# Patient Record
Sex: Female | Born: 1955 | Race: White | Hispanic: No | Marital: Single | Smoking: Former smoker
Health system: Southern US, Community
[De-identification: ages and names within clinical notes are randomized; demographics above are authoritative.]

## PROBLEM LIST (undated history)

## (undated) DIAGNOSIS — I10 Essential (primary) hypertension: Secondary | ICD-10-CM

## (undated) HISTORY — PX: CHOLECYSTECTOMY: SHX55

## (undated) HISTORY — PX: ABDOMINAL HYSTERECTOMY: SHX81

## (undated) HISTORY — PX: TONSILLECTOMY: SUR1361

## (undated) HISTORY — PX: APPENDECTOMY: SHX54

---

## 1998-01-05 ENCOUNTER — Other Ambulatory Visit: Admission: RE | Admit: 1998-01-05 | Discharge: 1998-01-05 | Payer: Self-pay | Admitting: Obstetrics and Gynecology

## 1998-09-28 ENCOUNTER — Ambulatory Visit (HOSPITAL_COMMUNITY): Admission: RE | Admit: 1998-09-28 | Discharge: 1998-09-28 | Payer: Self-pay | Admitting: Family Medicine

## 1998-09-28 ENCOUNTER — Encounter: Payer: Self-pay | Admitting: Family Medicine

## 2001-03-28 ENCOUNTER — Other Ambulatory Visit: Admission: RE | Admit: 2001-03-28 | Discharge: 2001-03-28 | Payer: Self-pay | Admitting: Obstetrics and Gynecology

## 2001-05-24 ENCOUNTER — Encounter (INDEPENDENT_AMBULATORY_CARE_PROVIDER_SITE_OTHER): Payer: Self-pay

## 2001-05-24 ENCOUNTER — Ambulatory Visit (HOSPITAL_COMMUNITY): Admission: RE | Admit: 2001-05-24 | Discharge: 2001-05-24 | Payer: Self-pay | Admitting: Obstetrics and Gynecology

## 2002-04-04 ENCOUNTER — Other Ambulatory Visit: Admission: RE | Admit: 2002-04-04 | Discharge: 2002-04-04 | Payer: Self-pay | Admitting: Obstetrics and Gynecology

## 2003-04-23 ENCOUNTER — Other Ambulatory Visit: Admission: RE | Admit: 2003-04-23 | Discharge: 2003-04-23 | Payer: Self-pay | Admitting: Obstetrics and Gynecology

## 2003-06-12 ENCOUNTER — Ambulatory Visit (HOSPITAL_COMMUNITY): Admission: RE | Admit: 2003-06-12 | Discharge: 2003-06-12 | Payer: Self-pay | Admitting: Family Medicine

## 2004-04-27 ENCOUNTER — Encounter (INDEPENDENT_AMBULATORY_CARE_PROVIDER_SITE_OTHER): Payer: Self-pay | Admitting: *Deleted

## 2004-04-27 ENCOUNTER — Observation Stay (HOSPITAL_COMMUNITY): Admission: RE | Admit: 2004-04-27 | Discharge: 2004-04-28 | Payer: Self-pay | Admitting: Obstetrics and Gynecology

## 2004-04-30 ENCOUNTER — Ambulatory Visit: Payer: Self-pay | Admitting: Pulmonary Disease

## 2004-04-30 ENCOUNTER — Ambulatory Visit: Payer: Self-pay | Admitting: Infectious Diseases

## 2004-04-30 ENCOUNTER — Inpatient Hospital Stay (HOSPITAL_COMMUNITY): Admission: AD | Admit: 2004-04-30 | Discharge: 2004-05-06 | Payer: Self-pay | Admitting: Obstetrics and Gynecology

## 2004-05-01 ENCOUNTER — Encounter: Payer: Self-pay | Admitting: Obstetrics and Gynecology

## 2005-06-08 ENCOUNTER — Other Ambulatory Visit: Admission: RE | Admit: 2005-06-08 | Discharge: 2005-06-08 | Payer: Self-pay | Admitting: Obstetrics and Gynecology

## 2009-03-17 ENCOUNTER — Encounter: Admission: RE | Admit: 2009-03-17 | Discharge: 2009-03-17 | Payer: Self-pay | Admitting: Family Medicine

## 2010-04-02 ENCOUNTER — Encounter: Admission: RE | Admit: 2010-04-02 | Discharge: 2010-04-02 | Payer: Self-pay | Admitting: Family Medicine

## 2010-04-15 ENCOUNTER — Encounter: Admission: RE | Admit: 2010-04-15 | Discharge: 2010-04-15 | Payer: Self-pay | Admitting: Family Medicine

## 2010-06-06 ENCOUNTER — Encounter: Payer: Self-pay | Admitting: Family Medicine

## 2010-10-01 NOTE — Discharge Summary (Signed)
Julie Mcguire, DUKEMAN               ACCOUNT NO.:  [id number]   MEDICAL RECORD NO.:  [id number]          PATIENT TYPE:  INP   LOCATION:  9372                          FACILITY:  [facility]   PHYSICIAN:  Guy Sandifer. Tomblin II, M.D.DATE OF BIRTH:  04/10/1956   DATE OF ADMISSION:  04/30/2004  DATE OF DISCHARGE:  05/06/2004                                 DISCHARGE SUMMARY   ADMITTING DIAGNOSIS:  Postoperative fever.   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   REASON FOR ADMISSION:  This patient is a 55 year old white female status  post laparoscopically assisted vaginal hysterectomy on April 27, 2004.  She was discharged home on postoperative day #1 with an uncomplicated  postoperative course.  She reports feeling well until the evening of  postoperative day #2 when she developed fever and chills.  She felt mildly  short of breath and only had been febrile.  She reports a fever of 103.2 at  home.  She then presented the next morning and was subsequently admitted.   HOSPITAL COURSE:  On admission, on the evening of April 30, 2004, she was  noted to have a temperature of 103.1, white count of 31.5 and a hemoglobin  of 9.2.  She was placed on Rocephin 1 g every 12 hours.  On the second day  of hospitalization her temperature was noted to be 98.9, 98.9, with a 102.2  spike and back down to 98.1.  Pulse was 90s-120s and regular.  Blood  pressures were stable and respiratory rate was 18-20.  Abdomen was soft and  extremities were nontender.  At that time a chest x-ray and abdominopelvic  CT was ordered.  The abdominopelvic CT had poor opacification due to leaking  of the IV contrast at the IV port.  It was otherwise normal except for a  hiatal hernia.  Chest x-ray revealed bibasilar atelectasis.  During the  abdominopelvic CT the patient had an episode of shortness of breath.  She  was afebrile at that time.  Therefore a spiral CT was obtained which was  negative for  pulmonary embolism.  It again confirmed atelectasis.  Patient  was also complaining of hot flashes that evening although she had a  bilateral salpingo-oophorectomy and was not taking estrogen replacement.  She was passing flatus and was eating well.  That evening she was afebrile.  Pulse was 110s-120s and regular.  At that time she was transferred to the  intensive care unit.  The Rocephin was discontinued and she was started on  ampicillin, gentamicin and clindamycin.  Laboratories that same evening  revealed a white count of 23.5, hemoglobin 8.3, essentially normal  electrolytes with the exception of a potassium of 3.  Arterial blood gases  on room air were pH 7.42, pCO2 34.8, pO2 73.6 and bicarbonate 22.3.  Patient  was noted to have occasional unifocal PVCs.  O2 saturations were 97-99%.  On  the morning of May 02, 2004 she complained of some mild nausea and a  poor appetite.  She did not complain of shortness of breath although she  looked short of breath  upon walking across the room.  She continued to pass  flatus and had normal bowel and bladder function.  Heart rate was sinus  tachycardia in the 130s.  Temperature overnight had gone to 101.4 and was  98.8 that morning.  CBC that morning revealed a white count of 23.6 with 96%  neutrophils and a hemoglobin of 8.6 which was stable.  Patient did receive  potassium intravenously through the night and potassium this morning was  3.3.  She was continued on antibiotics and given oral potassium  supplementation as well.  In view of her anemia that was symptomatic with  tachycardia as well as shortness of breath and after carefully discussing  with the patient the risks involved, she was transferred 2 units of packed  red blood cells.  The following day she felt better after transfusion.  She  had a bowel movement and was tolerating regular diet.  Heart rate was 110s-  120s sinus tachycardia.  She was ambulatory.  Temperature overnight  was  101.6 and was 100.8 that morning.  Respiratory rate was 20-23 and oxygen  saturations were 96-100% on room air.  She had been given 10 mg of Lasix  overnight for complaints of peripheral edema.  That morning white count  decreased further to 16.6 and her hemoglobin was 9.1 status post  transfusion.  Potassium was 2.9.  Her antibiotics were continued as well as  potassium supplementation.  On the evening of May 03, 2004 temperature  was 101.1.  She remained mildly tachycardic in the 100-110s.  She had  crackles at the lung bases.  A chest x-ray obtained that evening showed  increased atelectasis at the bases and a right effusion.  Telephone  consultation with Dr. Jayme Cloud of pulmonary was undertaken.  Per her  recommendations the ampicillin, gentamicin and clindamycin was discontinued  and she was started on Primaxin and vancomycin.  She was given Xopenex  respiratory treatments with flutter valve.  That evening temperature was  101.1 and by the morning of May 04, 2004 was 98.4.  Heart rate was in  the 80s and vital signs were stable.  Lung sounds were improved.  White  count is 16.8 and hemoglobin is stable at 9.8.  Pulmonary consultation that  day agreed with present management.  Sed rate, ANA and rheumatoid factor  were ordered.  Infectious disease consultation was also obtained on May 04, 2004.  The assessment was suspicious for septic pelvic thrombophlebitis.  The patient also had a complaint at that point of a swollen left calf which  was nontender on examination.  The following morning she was noted to have a  maximum temperature overnight of 101.6.  White count was 20.2 and hemoglobin  was stable at 10.  Anticoagulation was ordered.  A venous duplex of the legs  were negative for DVT.  On May 06, 2004 the patient stated that she  felt good.  Her temperature overnight was 101 degrees.  Abdomen remained soft.  White count is 22 and hemoglobin 10.1.  Potassium  is 3.4.  The  patient was desperate to go home for Christmas.  The diagnosis of probable  septic pelvic thrombophlebitis was discussed with the patient.  The  potential complications were stressed.  Patient stated that she clearly  understood but was also determined to go home.  I also discussed this  carefully with Dr. Maurice March of infectious disease who was comfortable with the  diagnosis and with treatment as an outpatient.  The  patient was therefore  discharged on May 06, 2004.   CONDITION ON DISCHARGE:  Stable.   DIET:  Regular as tolerated.   ACTIVITY:  No lifting, no operation of automobiles, no vaginal entry.   MEDICATIONS:  1.  Lovenox q.12h. with dosing per pharmacy.  2.  Augmentin 875 mg b.i.d. for 10 days.  3.  Tylenol as needed.   Home health will see the patient twice a day, take vital signs and  administer the Lovenox.  Patient is given careful instructions if she has  any further symptoms at all she will contact me immediately.   FOLLOW UP:  Followup is in the office the following Monday.      JET/MEDQ  D:  06/02/2004  T:  06/02/2004  Job:  [id number]

## 2010-10-01 NOTE — H&P (Signed)
[facility]  Patient:    Julie Mcguire, Julie Mcguire Visit Number: [id number] MRN: [id number]          Service Type: Attending:  Guy Sandifer. Arleta Creek, M.D. Dictated by:   Guy Sandifer Arleta Creek, M.D. Adm. Date:  05/24/01                           History and Physical  CHIEF COMPLAINT:              Menorrhagia.  HISTORY OF PRESENT ILLNESS:   This patient is a 55 year old married white female, G2, P2, status post tubal ligation with regular menses.  However, they are becoming extremely heavy with clotting and cramping, changing a super pad every two hours.  On pelvic ultrasound on May 11, 2001, uterus measures 9.3 x 6.8 x 4.6 cm.  A 12 mm subserosal fibroid is noted.  Ovaries are normal bilaterally with small follicular cysts.  Sonohistogram reveals a 14 x 7 mm mass on the endometrial wall possibly consistent with an endometrial polyp. Hysteroscopy with resectoscope is discussed with the patient.  She is being admitted for this surgery.  PAST MEDICAL HISTORY:         Negative.  PAST SURGICAL HISTORY:        1. Tonsillectomy.                               2. Cholecystectomy.                               3. Appendectomy.  OBSTETRIC HISTORY:            Miscarriage in 1996, cesarean section for twins with tubal ligation in 1997.  MEDICATIONS:                  Zithromax pack started on May 21, 2001.  ALLERGIES:                    CODEINE leading to nausea and vomiting.  FAMILY HISTORY:               Chronic hypertension in paternal grandmother, coronary artery disease in paternal grandfather and father, breast and brain cancer in mother, lung cancer in father.  SOCIAL HISTORY:               Patient denies tobacco, alcohol, or drug abuse.  REVIEW OF SYSTEMS:            Patient had a recent upper respiratory infection but is feeling better.  PHYSICAL EXAMINATION:  VITAL SIGNS:                  Height 5 feet, 4 inches, weight 235 pounds, blood pressure  140/86.  HEENT:                        Without thyromegaly.  LUNGS:                        Clear to auscultation.  HEART:                        Regular rate and rhythm.  BACK:  Without CVA tenderness.  BREASTS:                      Without mass, retraction, discharge.  ABDOMEN:                      Soft and nontender, without masses.  PELVIC:                       Vulva, vagina, and cervix without lesion. Uterus is upper limits of normal size, nontender.  Adnexa nontender without masses.  EXTREMITIES/NEUROLOGIC:       Grossly within normal limits.  ASSESSMENT:                   Menorrhagia.  PLAN:                         Hysteroscopy with resectoscope, dilation and curettage. Dictated by:   Guy Sandifer Arleta Creek, M.D. Attending:  Guy Sandifer Arleta Creek, M.D. DD:  05/22/01 TD:  05/22/01 Job: [id number]

## 2010-10-01 NOTE — Op Note (Signed)
NAMESENAI, RAMNATH               ACCOUNT NO.:  [id number]   MEDICAL RECORD NO.:  [id number]          PATIENT TYPE:  AMB   LOCATION:  SDC                           FACILITY:  [facility]   PHYSICIAN:  Guy Sandifer. Tomblin II, M.D.DATE OF BIRTH:  [birth year]   DATE OF PROCEDURE:  04/27/2004  DATE OF DISCHARGE:                                 OPERATIVE REPORT   PREOPERATIVE DIAGNOSIS:  Menorrhagia.   POSTOPERATIVE DIAGNOSIS:  Menorrhagia.   PROCEDURE:  Laparoscopically-assisted vaginal hysterectomy and bilateral  salpingo-oophorectomy.   SURGEON:  Guy Sandifer. Henderson Cloud, M.D.   ASSISTANTLuvenia Redden, M.D.   ANESTHESIA:  General with endotracheal intubation.   ESTIMATED BLOOD LOSS:  150 mL.   SPECIMENS:  Uterus with bilateral tubes and ovaries.   INDICATIONS AND CONSENT:  This patient is a 55 year old, married, white  female, G1, P2 (twins), status post tubal ligation with increasingly heavy  menses.  Details are dictated in the History and Physical.  Laparoscopically-  assisted vaginal hysterectomy with bilateral salpingo-oophorectomy has been  discussed with the patient preoperatively.  Potential risks and  complications have been discussed preoperatively including but not limited  to infection, bowel, bladder, ureteral damage, bleeding requiring  transfusion of blood products, and possible transfusion reaction, HIV and  hepatitis acquisition, DVT, PE, pneumonia, laparotomy, fistula formation,  postoperative dyspareunia.  Issues of menopause have been discussed  preoperatively as well.   FINDINGS:  There are a few filmy adhesions to the right upper quadrant,  status post cholecystectomy with no closed loops observed.  The uterus is  about 6 weeks in size, smooth in contour.  Anterior, posterior cul-de-sacs  are normal.  Tubes are status post ligation bilaterally.  Ovaries are normal  bilaterally.   PROCEDURE IN DETAIL:  The patient is taken to the operating room where she  is  identified, placed in the dorsal supine position, and general anesthesia  in induced via general endotracheal intubation.  She was then placed in the  dorsal lithotomy position where she was prepped abdominally and vaginally,  bladder straight catheterized.  A Hulka tenaculum was placed, uterus was  manipulated, and she was draped in a sterile fashion.  A small  infraumbilical incision is made.  Towel clips are used on either side of the  umbilicus to elevate the anterior abdominal wall and a disposable Veress  needle is placed.  The syringe and drop test are normal, 2 L of gas are  insufflated into low pressure.  There is good tympany in the right upper  quadrant.  Veress needle in removed and is replaced with a disposable 10-11  trocar sleeve.  Proper placement is verified with the laparoscope and no  damage to surrounding structures is noted.  Pneumoperitoneum is then  continued and a small suprapubic incision is made and the 5 mm disposable  trocar sleeve is placed under direct visualization without difficulty.  The  above findings were noted.  The right infundibulopelvic ligament was taken  down with Gyrus bipolar cautery cutting instrument.  Continued bites are  taken across the round and down to the  level of the vesicouterine  peritoneum.  Similar procedure is carried out on the left.  Good hemostasis  is noted.  The vesicouterine peritoneum is incised in the midline,  hydrodissected, and taken down cephalolaterally.  The suprapubic trocar  sleeve is removed.  All other instruments are removed and attention is  turned to the vagina.   The posterior cul-de-sac is entered sharply and the cervix is circumscribed  with unipolar cautery.  The mucosa is advanced sharply and bluntly.  The  uterosacral ligaments are taken down bilaterally with the Gyrus bipolar  cautery instrument.  Further bites of the bladder pillar's cardinal  ligaments and uterine vessels are taken bilaterally.  The  specimen is  delivered posteriorly.  A bleeder in the branch of the left uterine vessel  is clamped and doubly ligated with two free ties of 0 Monocryl suture.  All  sutures will be 0 Monocryl unless otherwise designated.  The uterosacral  ligaments were plicated to the vaginal cuff bilaterally and then plicated in  the midline with a third suture.  The cuff is closed with figure-of-eights.  A Foley catheter is placed in the bladder and clear urine is noted.   Attention is returned to the abdomen.  Excellent hemostasis is noted all  around.  Excess fluid is removed.  Suprapubic trocar sleeve is removed and  pneumoperitoneum is completely reduced and the umbilical trocar sleeve is  removed.  Both incisions are injected with 0.5% plain Marcaine and Dermabond  is applied to the skin.  All counts are correct.  The patient is awakened  and taken to recovery room in stable condition.     Jame   JET/MEDQ  D:  04/27/2004  T:  04/27/2004  Job:  [id number]

## 2010-10-01 NOTE — Op Note (Signed)
[facility]  Patient:    Julie Mcguire, Julie Mcguire Visit Number: [id number] MRN: [id number]          Service Type: DSU Location: [facility] Attending Physician:  Soledad Gerlach Dictated by:   Guy Sandifer Arleta Creek, M.D. Proc. Date: 05/24/01 Admit Date:  05/24/2001                             Operative Report  PREOPERATIVE DIAGNOSES:       Menometrorrhagia.  POSTOPERATIVE DIAGNOSES:      Endometrial polyp.  PROCEDURE:                    Hysteroscopy with resectoscope, dilatation and curettage.  ANESTHESIA:                   General with LMA.  ESTIMATED BLOOD LOSS:         50 cc.  DEFICIT:                      Sorbitol distending media 20 cc.  INDICATIONS AND CONSENTS:     Patient is a 55 year old married white female G2, P2 status post tubal ligation with increasing heavy irregular bleeding. Details are dictated in the history and physical.  Hysteroscopy with resectoscope, D&C is discussed with the patient.  Potential risks and complications are discussed including, but not limited to, infection, uterine perforation and organ damage, bleeding requiring transfusion of blood products with possible transfusion reaction, HIV and hepatitis acquisition, DVT, PE. pneumonia, laparoscopy, laparotomy, hysterectomy.  All questions are answered and consent is signed on the chart.  FINDINGS:                     There is an approximately 1 cm irregular fleshy colored growth in the right upper endometrial canal.  Remainder of the cavity appears normal.  PROCEDURE:                    Patient is taken to the operating room, placed in the dorsal supine position where general anesthesia is induced via LMA. She is then placed in the dorsal lithotomy position where she is prepped, bladder straight catheterized, and she is draped in a sterile fashion. Anterior cervical lip is injected with 1% Xylocaine and grasped with a single tooth tenaculum.  Paracervical block is  then placed at the 2, 4, 5, 7, 8, 10 oclock positions with approximately 20 cc of 1% Xylocaine plain.  Cervix is gently progressively dilated to a 31 Pratt dilator.  The hysteroscopic resectoscope is placed at the cervix and advanced under direct visualization using Sorbitol distending medium.  The above findings are noted.  The polypoid type growth is resected with a single right angle wire loop without difficulty.  The hysteroscope is withdrawn.  Sharp curettage is carried out. All specimens are sent to pathology.  The tenaculum is removed and no bleeding is noted.  All instruments are removed.  All counts are correct.  Patient is awakened, taken to the recovery room in stable condition. Dictated by:   Guy Sandifer Arleta Creek, M.D. Attending Physician:  Soledad Gerlach DD:  05/24/01 TD:  05/24/01 Job: [id number]

## 2010-10-01 NOTE — H&P (Signed)
NAMEQUANTINA, DERSHEM                ACCOUNT NO.:  [id number]   MEDICAL RECORD NO.:  [id number]           PATIENT TYPE:   LOCATION:                                 FACILITY:   PHYSICIAN:  Guy Sandifer. Arleta Creek, M.D.   DATE OF BIRTH:   DATE OF ADMISSION:  04/27/2004  DATE OF DISCHARGE:                                HISTORY & PHYSICAL   CHIEF COMPLAINT:  Heavy menses.   HISTORY OF PRESENT ILLNESS:  The patient is a 55 year old married, white  female, G2, P2, status post tubal ligation who is also status post D&C  hysteroscopy with removal of benign endometrial polyps in 2003.  Complains  that bleeding is becoming heavier and heavier.  She passes large clots.  Ultrasound on March 03, 2004 revealed the uterus measuring 11.1 by 6.2 by  7.7 cm.  Multiple small leiomyomata are noted, the largest being 12 mm.  Sonohysterogram was positive for multiple polypoid-type structures in the  endometrial cavity.  Ovaries are essentially normal with a 3.9 cm simple  cyst on the right ovary.  After carefully discussing options with the  patient, she is being admitted for laparoscopically assisted vaginal  hysterectomy and bilateral salpingo-oophorectomy.  Risks of surgery as well  as issues of menopause have been carefully reviewed with the patient  preoperatively.   PAST MEDICAL HISTORY:  Negative.   PAST SURGICAL HISTORY:  1.  Operation on her index finger.  2.  Tonsillectomy.  3.  Cholecystectomy.  4.  Appendectomy.  5.  Hysteroscopy with D&C 2003.   OBSTETRIC HISTORY:  1.  Miscarriage, 1996.  2.  Cesarean section for twins with tubal ligation in 1997.   MEDICATIONS:  None.   ALLERGIES:  CODEINE leading to nausea and vomiting.   FAMILY HISTORY:  Chronic hypertension in maternal grandmother, coronary  artery disease, maternal grandfather and father.  Breast and ovarian cancer  in mother, lung cancer in father.   SOCIAL HISTORY:  The patient denies tobacco, alcohol, or drug abuse.   REVIEW OF SYSTEMS:  NEUROLOGIC: Denies headache.  CARDIAC: Denies chest  pain.  PULMONARY: No shortness of breath.  GASTROINTESTINAL: Denies recent  changes in bowel habits.   PHYSICAL EXAMINATION:  VITAL SIGNS:  Height 5 feet, 4 inches, weight 244  pounds.  HEENT: Without thyromegaly.  LUNGS:  Clear to auscultation.  HEART:  Regular rate and rhythm.  BACK:  Without CVA tenderness.  BREASTS:  Without mass, retraction, discharge.  ABDOMEN: Obese, soft, nontender, without palpable masses.  PELVIC:  Vulva, vagina, cervix without lesion.  Uterus is upper limits of  normal size, mobile, nontender.  Adnexa are nontender without masses.  EXTREMITIES:  Grossly within normal limits.  NEUROLOGIC:  Grossly within normal limits.   ASSESSMENT:  Menorrhagia.   PLAN:  Laparoscopically assisted vaginal hysterectomy and bilateral salpingo-  oophorectomy.     Jame   JET/MEDQ  D:  04/26/2004  T:  04/26/2004  Job:  [id number]

## 2010-10-01 NOTE — Discharge Summary (Signed)
Julie Mcguire, Julie Mcguire               ACCOUNT NO.:  [id number]   MEDICAL RECORD NO.:  [id number]          PATIENT TYPE:  OBV   LOCATION:  9311                          FACILITY:  [facility]   PHYSICIAN:  Guy Sandifer. Tomblin II, M.D.DATE OF BIRTH:  1956-04-08   DATE OF ADMISSION:  04/27/2004  DATE OF DISCHARGE:  04/28/2004                                 DISCHARGE SUMMARY   ADMISSION DIAGNOSES:  Menorrhagia.   DISCHARGE DIAGNOSES:  Menorrhagia.   PROCEDURES:  On April 27, 2004, laparoscopically assisted vaginal  hysterectomy with bilateral salpingo-oophorectomy.   REASON FOR ADMISSION:  This patient is a 55 year old married white female,  G2, P2, status post tubal ligation with increasingly heavy menses.  Details  are dictated in the History and Physical.  She is admitted for surgical  management.   HOSPITAL COURSE:  The patient is admitted to the hospital to undergo the  above procedure.  Estimated blood loss is 150 cc.  On the evening of  surgery, she has good pain relief.  Urine output is clear.  Vital signs are  stable.  She is afebrile.  On the morning of discharge, she is passing  flatus, is hungry, has not yet tried regular diet.  Vital signs are stable  and she is afebrile.  White count is 10.0, hemoglobin 8.2 and pathology is  pending.  Abdomen is soft with good bowel sounds.  The patient will be  advanced to a regular diet, and if she tolerates this, will be discharged  home after lunch.   CONDITION ON DISCHARGE:  Good.   DISCHARGE INSTRUCTIONS:  1.  Diet:  Regular as tolerated.  2.  Activity:  No lifting, no operation of automobiles, no vaginal entry.  3.  She is to call the office for problems including but not limited to      heavy vaginal bleeding, increasing pain, persistent nausea or vomiting      or temperature of 101.   DISCHARGE MEDICATIONS:  1.  Percocet 5/325 mg #30, 1-2 p.o. q.6h. p.r.n.  2.  Chromogen #30 one p.o. daily.  3.  Multivitamin daily.  4.  Stool  softener daily as needed.   FOLLOWUP:  Follow up in the office in two weeks.     Jame  JET/MEDQ  D:  04/28/2004  T:  04/28/2004  Job:  [id number]

## 2012-07-11 ENCOUNTER — Other Ambulatory Visit: Payer: Self-pay

## 2012-07-17 ENCOUNTER — Ambulatory Visit: Payer: Self-pay

## 2012-07-19 ENCOUNTER — Ambulatory Visit
Admission: RE | Admit: 2012-07-19 | Discharge: 2012-07-19 | Disposition: A | Discharge status: Home / Self Care | Payer: BC Managed Care – PPO | Source: Ambulatory Visit

## 2014-02-21 ENCOUNTER — Other Ambulatory Visit: Payer: Self-pay

## 2014-02-21 DIAGNOSIS — Z1239 Encounter for other screening for malignant neoplasm of breast: Secondary | ICD-10-CM

## 2014-02-27 ENCOUNTER — Ambulatory Visit
Admission: RE | Admit: 2014-02-27 | Discharge: 2014-02-27 | Disposition: A | Discharge status: Home / Self Care | Payer: BC Managed Care – PPO | Source: Ambulatory Visit

## 2014-02-27 DIAGNOSIS — Z1239 Encounter for other screening for malignant neoplasm of breast: Secondary | ICD-10-CM

## 2015-12-11 ENCOUNTER — Ambulatory Visit
Admission: RE | Admit: 2015-12-11 | Discharge: 2015-12-11 | Disposition: A | Discharge status: Home / Self Care | Payer: BC Managed Care – PPO | Source: Ambulatory Visit | Attending: Family Medicine | Admitting: Family Medicine

## 2015-12-11 ENCOUNTER — Other Ambulatory Visit: Payer: Self-pay | Admitting: Family Medicine

## 2015-12-11 DIAGNOSIS — M79674 Pain in right toe(s): Secondary | ICD-10-CM

## 2015-12-11 DIAGNOSIS — M79672 Pain in left foot: Secondary | ICD-10-CM

## 2016-03-21 ENCOUNTER — Other Ambulatory Visit: Payer: Self-pay | Admitting: Family Medicine

## 2016-03-21 DIAGNOSIS — Z1231 Encounter for screening mammogram for malignant neoplasm of breast: Secondary | ICD-10-CM

## 2016-03-22 ENCOUNTER — Ambulatory Visit
Admission: RE | Admit: 2016-03-22 | Discharge: 2016-03-22 | Disposition: A | Discharge status: Home / Self Care | Payer: BC Managed Care – PPO | Source: Ambulatory Visit | Attending: Family Medicine | Admitting: Family Medicine

## 2016-03-22 DIAGNOSIS — Z1231 Encounter for screening mammogram for malignant neoplasm of breast: Secondary | ICD-10-CM

## 2016-12-16 ENCOUNTER — Other Ambulatory Visit: Payer: Self-pay | Admitting: Family Medicine

## 2016-12-16 ENCOUNTER — Ambulatory Visit
Admission: RE | Admit: 2016-12-16 | Discharge: 2016-12-16 | Disposition: A | Discharge status: Home / Self Care | Payer: BC Managed Care – PPO | Source: Ambulatory Visit | Attending: Family Medicine | Admitting: Family Medicine

## 2016-12-16 DIAGNOSIS — M5410 Radiculopathy, site unspecified: Secondary | ICD-10-CM

## 2017-05-11 ENCOUNTER — Other Ambulatory Visit: Payer: Self-pay | Admitting: Family Medicine

## 2017-05-11 DIAGNOSIS — Z139 Encounter for screening, unspecified: Secondary | ICD-10-CM

## 2017-06-05 ENCOUNTER — Ambulatory Visit: Payer: BC Managed Care – PPO

## 2017-06-06 ENCOUNTER — Ambulatory Visit
Admission: RE | Admit: 2017-06-06 | Discharge: 2017-06-06 | Disposition: A | Discharge status: Home / Self Care | Payer: BC Managed Care – PPO | Source: Ambulatory Visit | Attending: Family Medicine | Admitting: Family Medicine

## 2017-06-06 DIAGNOSIS — Z139 Encounter for screening, unspecified: Secondary | ICD-10-CM

## 2017-08-09 ENCOUNTER — Telehealth (INDEPENDENT_AMBULATORY_CARE_PROVIDER_SITE_OTHER): Payer: Self-pay | Admitting: Orthopaedic Surgery

## 2017-08-09 NOTE — Telephone Encounter (Signed)
Please advise. See message below.  

## 2017-08-09 NOTE — Telephone Encounter (Signed)
I don't really have a weight limit per say.  It really takes a physical exam to determine whether or not I can safely get into the hip.  I have done surgery on plenty of obese people and it really takes seeing them in person because every ones anatomy is different.

## 2017-08-09 NOTE — Telephone Encounter (Signed)
This lady calledand is looking at the possibility of Dr. Magnus IvanBlackman seeing her for a second opinion for a hip replacement. She has had x-rays and cortisone [facility] in [facility], seeing Dr. Ethelene Halamos and Dr. Charlann Boxerlin. She was referred here by a few patients of our practice. She had been told in the past by that office that she would need to lose around 50 pounds before she could have surgery and she has only lost 20. She is wondering about Dr. Vevelyn RoyalsBlackmans guidelines as far as weight loss goes before she even makes an appointment. I told her I would send a message to see, please advise # [phone]

## 2017-08-10 NOTE — Telephone Encounter (Signed)
Called patient and discussed message below. Appt made for Thursday.

## 2017-08-17 ENCOUNTER — Ambulatory Visit (INDEPENDENT_AMBULATORY_CARE_PROVIDER_SITE_OTHER): Payer: BC Managed Care – PPO | Admitting: Orthopaedic Surgery

## 2017-08-17 ENCOUNTER — Encounter (INDEPENDENT_AMBULATORY_CARE_PROVIDER_SITE_OTHER): Payer: Self-pay | Admitting: Orthopaedic Surgery

## 2017-08-17 DIAGNOSIS — M1612 Unilateral primary osteoarthritis, left hip: Secondary | ICD-10-CM

## 2017-08-17 DIAGNOSIS — M25552 Pain in left hip: Secondary | ICD-10-CM

## 2017-08-17 MED ORDER — DOXYCYCLINE HYCLATE 100 MG PO TABS: 100.0000 mg | ORAL_TABLET | Freq: Two times a day (BID) | ORAL | 0 refills | Status: DC

## 2017-08-17 NOTE — Progress Notes (Signed)
Office Visit Note   Patient: Alain MarionDeborah M Smucker           Date of Birth: 12-Feb-1956           MRN: [id number] Visit Date: 08/17/2017              Requested by: Elias Elseeade, Robert, MD [phone] [address] [place], [place] [id number] PCP: Elias Elseeade, Robert, MD   Assessment & Plan: Visit Diagnoses:  1. Pain of left hip joint   2. Unilateral primary osteoarthritis, left hip     Plan: Given the severity of her arthritis I agree with proceeding with a left total hip arthroplasty but we need to wait a few weeks to see if we can get her skin lesions to heal over.  We will have her clean herself with antibacterial soap daily and put on 2 weeks of doxycycline 100 mg twice daily.  Also gave her some mupirocin ointment to place on these wounds.  I will see her back in 2 weeks and at that point we will work on likely setting her up for hip replacement surgery if her skin is looking better.  All questions concerns were answered and addressed.  I showed her hip model explained in detail what the surgery involves.  I gave her handout on her anterior hip surgery we talked about the risk and benefits of this and we talked about her intraoperative and postoperative course.  All questions concerns were answered and addressed.  We will reevaluate her in 2 weeks.  Follow-Up Instructions: Return in about 2 weeks (around 08/31/2017).   Orders:  No orders of the defined types were placed in this encounter.  Meds ordered this encounter  Medications  . doxycycline (VIBRA-TABS) 100 MG tablet    Sig: Take 1 tablet (100 mg total) by mouth 2 (two) times daily.    Dispense:  60 tablet    Refill:  0      Procedures: No procedures performed   Clinical Data: No additional findings.   Subjective: Chief Complaint  Patient presents with  . Left Hip - Pain  . Left Knee - Pain  . Right Knee - Pain  Patient is a very pleasant moderately obese female who is 62 years old and debilitating left hip pain.  She has known  severe osteoarthritis at left hip.  She has had 2 intra-articular steroid injections which did help with the first 1 but the cycle and is not help.  Her pain is become 10 out of 10.  It is daily.  Her pain is detrimentally affecting her active daily living, quality of life, and her mobility.  At this point she does wish to proceed with a total hip arthroplasty.  She does have x-rays that accompany her from where she saw a different orthopedic office in town.  She is not a diabetic and not a smoker.  Other than her obesity she has no significant active medical problems.  She does report having small blisters around her body on the sides of her hips and her elbows.  She has been on antibiotics for this before.   HPI  Review of Systems She currently denies any headache, chest pain, breath, fever, chills, nausea, vomiting.  She is alert and oriented x3 and in no acute distress  Objective: Vital Signs: There were no vitals taken for this visit.  Physical Exam She is alert and oriented x3 and in no acute distress Ortho Exam  Examination of her right  hip is normal examination of her left hip shows severe pain with attempts of internal and external rotation there is a grinding feeling that I can sense in that hip as well.  She is slightly shorter on her leg length with that left side being slightly shorter than the right.  Examination with her laying supine of looking at the skin she has she does have a significant body fold overweight may be incision but there is definitely some nondraining boils around her right and left hips in her arms.  There is no cellulitis associated with this. Specialty Comments:  No specialty comments available.  Imaging: No results found. X-rays that accompany her of her pelvis and left hip shows severe end-stage arthritis of the left hip.  There is complete loss of the joint space with severe joint space narrowing in particular osteophytes as well as cystic change in the  femoral head.  PMFS History: There are no active problems to display for this patient.  No past medical history on file.  No family history on file.   Social History   Occupational History  . Not on file  Tobacco Use  . Smoking status: Not on file  Substance and Sexual Activity  . Alcohol use: Not on file  . Drug use: Not on file  . Sexual activity: Not on file

## 2017-08-30 ENCOUNTER — Ambulatory Visit (INDEPENDENT_AMBULATORY_CARE_PROVIDER_SITE_OTHER): Payer: BC Managed Care – PPO | Admitting: Orthopaedic Surgery

## 2017-08-30 ENCOUNTER — Encounter (INDEPENDENT_AMBULATORY_CARE_PROVIDER_SITE_OTHER): Payer: Self-pay | Admitting: Orthopaedic Surgery

## 2017-08-30 DIAGNOSIS — M1612 Unilateral primary osteoarthritis, left hip: Secondary | ICD-10-CM | POA: Insufficient documentation

## 2017-08-30 MED ORDER — SULFAMETHOXAZOLE-TRIMETHOPRIM 800-160 MG PO TABS: 1.0000 | ORAL_TABLET | Freq: Two times a day (BID) | ORAL | 0 refills | Status: DC

## 2017-08-30 NOTE — Progress Notes (Signed)
The patient is coming in second evaluate her left proximal thigh.  We have had her on antibiotics including an antibiotic ointment due to chronic boils around her body.  She has severe arthritis of her left hip and is hoping we could proceed with a hip replacement.  However she understands that where these lesions are is where we would make an incision.  She is had infections in the past and for elective surgery like this is too high risk to put her through any type of surgery they could end up with an infection.  On exam the lesions are improving but not to the point yet ready comfortable proceeding with surgery when examining her left thigh.  She does have a large pannus that is close to her thigh as well.  We will have her continue the antibiotic ointment and try Bactrim double strength for the next 2 weeks.  We will see her back in 2 weeks to reevaluate the skin and to see if were closer to setting her up for this surgery which would be a left anterior hip replacement.

## 2017-09-13 ENCOUNTER — Encounter (INDEPENDENT_AMBULATORY_CARE_PROVIDER_SITE_OTHER): Payer: Self-pay | Admitting: Orthopaedic Surgery

## 2017-09-13 ENCOUNTER — Other Ambulatory Visit (INDEPENDENT_AMBULATORY_CARE_PROVIDER_SITE_OTHER): Payer: Self-pay | Admitting: Orthopaedic Surgery

## 2017-09-13 ENCOUNTER — Ambulatory Visit (INDEPENDENT_AMBULATORY_CARE_PROVIDER_SITE_OTHER): Payer: BC Managed Care – PPO | Admitting: Orthopaedic Surgery

## 2017-09-13 DIAGNOSIS — M1612 Unilateral primary osteoarthritis, left hip: Secondary | ICD-10-CM | POA: Diagnosis not present

## 2017-09-13 NOTE — Telephone Encounter (Signed)
Please advise 

## 2017-09-13 NOTE — Progress Notes (Signed)
The patient is very well-known to me.  She is a 62 year old with severe debilitating arthritis involving her left hip.  Is been well documented this arthritis is severe and we will schedule her for hip replacement surgery.  We have been hesitant to do so due to her history of boils around her skin and she has had a history of MRSA.  The wounds were on the left proximal thigh right around where we would make an incision for surgery.  We have had her cleaning herself daily with antibacterial soap and water and placing Bactroban ointment on these wounds and she is been on double strength Bactrim.  She still has severe debilitating left hip pain.  On examination of her skin all the lesions have healed over completely.  There is only scars from her chronic lesions but again there is no wounds at all or any worrisome features about her skin.  Again we had a discussion about hip replacement surgery and I do feel comfortable at this point getting this scheduled.  She is already had all conservative treatment measures taken and failed including intra-articular injections.  She has significant Trendelenburg gait with a normal-appearing right hip but again her left hip is so severely arthritic and the next step is significant set up for the surgery.  She is already had handout on replacement surgery we had a long thorough discussion at previous visits about what the surgery involves including a thorough discussion and understanding of the risk minutes of the surgery.  We will work on getting this scheduled hopefully in the near future.

## 2017-09-14 ENCOUNTER — Other Ambulatory Visit (INDEPENDENT_AMBULATORY_CARE_PROVIDER_SITE_OTHER): Payer: Self-pay | Admitting: Physician Assistant

## 2017-09-14 NOTE — Progress Notes (Signed)
Need orders in epic for 09-22-17 surgery

## 2017-09-18 NOTE — Patient Instructions (Addendum)
Julie LATENDRESSE  09/18/2017   Your procedure is scheduled on: 09-22-17   Report to [facility]  Entrance     Report to admitting at 11:30 PM    Call this number if you have problems the morning of surgery [phone]   Remember: Do not eat food or drink liquids :After Midnight. You may have a Clear Liquid Diet from Midnight until 8:00 AM. After 8:00 AM, nothing until after surgery.      CLEAR LIQUID DIET   Foods Allowed                                                                     Foods Excluded  Coffee and tea, regular and decaf                             liquids that you cannot  Plain Jell-O in any flavor                                             see through such as: Fruit ices (not with fruit pulp)                                     milk, soups, orange juice  Iced Popsicles                                    All solid food Carbonated beverages, regular and diet                                    Cranberry, grape and apple juices Sports drinks like Gatorade Lightly seasoned clear broth or consume(fat free) Sugar, honey syrup  Sample Menu Breakfast                                Lunch                                     Supper Cranberry juice                    Beef broth                            Chicken broth Jell-O                                     Grape juice  Apple juice Coffee or tea                        Jell-O                                      Popsicle                                                Coffee or tea                        Coffee or tea  _____________________________________________________________________    Take these medicines the morning of surgery with A SIP OF WATER: Amlodipine (Norvasc), and Metoprolol Succinate (Toprol)                                 You may not have any metal on your body including hair pins and              piercings  Do not wear jewelry, make-up, lotions,  powders or perfumes, deodorant             Do not wear nail polish.  Do not shave  48 hours prior to surgery.              Do not bring valuables to the hospital. [facility] IS NOT             RESPONSIBLE   FOR VALUABLES.  Contacts, dentures or bridgework may not be worn into surgery.  Leave suitcase in the car. After surgery it may be brought to your room.      Special Instructions: N/A              Please read over the following fact sheets you were given: _____________________________________________________________________           [facility] - Preparing for Surgery Before surgery, you can play an important role.  Because skin is not sterile, your skin needs to be as free of germs as possible.  You can reduce the number of germs on your skin by washing with CHG (chlorahexidine gluconate) soap before surgery.  CHG is an antiseptic cleaner which kills germs and bonds with the skin to continue killing germs even after washing. Please DO NOT use if you have an allergy to CHG or antibacterial soaps.  If your skin becomes reddened/irritated stop using the CHG and inform your nurse when you arrive at Short Stay. Do not shave (including legs and underarms) for at least 48 hours prior to the first CHG shower.  You may shave your face/neck. Please follow these instructions carefully:  1.  Shower with CHG Soap the night before surgery and the  morning of Surgery.  2.  If you choose to wash your hair, wash your hair first as usual with your  normal  shampoo.  3.  After you shampoo, rinse your hair and body thoroughly to remove the  shampoo.                           4.  Use CHG as you would any other liquid  soap.  You can apply chg directly  to the skin and wash                       Gently with a scrungie or clean washcloth.  5.  Apply the CHG Soap to your body ONLY FROM THE NECK DOWN.   Do not use on face/ open                           Wound or open sores. Avoid contact with eyes, ears mouth  and genitals (private parts).                       Wash face,  Genitals (private parts) with your normal soap.             6.  Wash thoroughly, paying special attention to the area where your surgery  will be performed.  7.  Thoroughly rinse your body with warm water from the neck down.  8.  DO NOT shower/wash with your normal soap after using and rinsing off  the CHG Soap.                9.  Pat yourself dry with a clean towel.            10.  Wear clean pajamas.            11.  Place clean sheets on your bed the night of your first shower and do not  sleep with pets. Day of Surgery : Do not apply any lotions/deodorants the morning of surgery.  Please wear clean clothes to the hospital/surgery center.  FAILURE TO FOLLOW THESE INSTRUCTIONS MAY RESULT IN THE CANCELLATION OF YOUR SURGERY PATIENT SIGNATURE_________________________________  NURSE SIGNATURE__________________________________  ________________________________________________________________________   Julie Mcguire  An incentive spirometer is a tool that can help keep your lungs clear and active. This tool measures how well you are filling your lungs with each breath. Taking long deep breaths may help reverse or decrease the chance of developing breathing (pulmonary) problems (especially infection) following:  A long period of time when you are unable to move or be active. BEFORE THE PROCEDURE   If the spirometer includes an indicator to show your best effort, your nurse or respiratory therapist will set it to a desired goal.  If possible, sit up straight or lean slightly forward. Try not to slouch.  Hold the incentive spirometer in an upright position. INSTRUCTIONS FOR USE  1. Sit on the edge of your bed if possible, or sit up as far as you can in bed or on a chair. 2. Hold the incentive spirometer in an upright position. 3. Breathe out normally. 4. Place the mouthpiece in your mouth and seal your lips tightly  around it. 5. Breathe in slowly and as deeply as possible, raising the piston or the ball toward the top of the column. 6. Hold your breath for 3-5 seconds or for as long as possible. Allow the piston or ball to fall to the bottom of the column. 7. Remove the mouthpiece from your mouth and breathe out normally. 8. Rest for a few seconds and repeat Steps 1 through 7 at least 10 times every 1-2 hours when you are awake. Take your time and take a few normal breaths between deep breaths. 9. The spirometer may include an indicator to show your best effort. Use the indicator as a goal  to work toward during each repetition. 10. After each set of 10 deep breaths, practice coughing to be sure your lungs are clear. If you have an incision (the cut made at the time of surgery), support your incision when coughing by placing a pillow or rolled up towels firmly against it. Once you are able to get out of bed, walk around indoors and cough well. You may stop using the incentive spirometer when instructed by your caregiver.  RISKS AND COMPLICATIONS  Take your time so you do not get dizzy or light-headed.  If you are in pain, you may need to take or ask for pain medication before doing incentive spirometry. It is harder to take a deep breath if you are having pain. AFTER USE  Rest and breathe slowly and easily.  It can be helpful to keep track of a log of your progress. Your caregiver can provide you with a simple table to help with this. If you are using the spirometer at home, follow these instructions: SEEK MEDICAL CARE IF:   You are having difficultly using the spirometer.  You have trouble using the spirometer as often as instructed.  Your pain medication is not giving enough relief while using the spirometer.  You develop fever of 100.5 F (38.1 C) or higher. SEEK IMMEDIATE MEDICAL CARE IF:   You cough up bloody sputum that had not been present before.  You develop fever of 102 F (38.9 C) or  greater.  You develop worsening pain at or near the incision site. MAKE SURE YOU:   Understand these instructions.  Will watch your condition.  Will get help right away if you are not doing well or get worse. Document Released: 09/12/2006 Document Revised: 07/25/2011 Document Reviewed: 11/13/2006 [employer] Patient Information 2014 [employer], [place].   ________________________________________________________________________  WHAT IS A BLOOD TRANSFUSION? Blood Transfusion Information  A transfusion is the replacement of blood or some of its parts. Blood is made up of multiple cells which provide different functions.  Red blood cells carry oxygen and are used for blood loss replacement.  White blood cells fight against infection.  Platelets control bleeding.  Plasma helps clot blood.  Other blood products are available for specialized needs, such as hemophilia or other clotting disorders. BEFORE THE TRANSFUSION  Who gives blood for transfusions?   Healthy volunteers who are fully evaluated to make sure their blood is safe. This is blood bank blood. Transfusion therapy is the safest it has ever been in the practice of medicine. Before blood is taken from a donor, a complete history is taken to make sure that person has no history of diseases nor engages in risky social behavior (examples are intravenous drug use or sexual activity with multiple partners). The donor's travel history is screened to minimize risk of transmitting infections, such as malaria. The donated blood is tested for signs of infectious diseases, such as HIV and hepatitis. The blood is then tested to be sure it is compatible with you in order to minimize the chance of a transfusion reaction. If you or a relative donates blood, this is often done in anticipation of surgery and is not appropriate for emergency situations. It takes many days to process the donated blood. RISKS AND COMPLICATIONS Although transfusion therapy  is very safe and saves many lives, the main dangers of transfusion include:   Getting an infectious disease.  Developing a transfusion reaction. This is an allergic reaction to something in the blood you were given. Every precaution  is taken to prevent this. The decision to have a blood transfusion has been considered carefully by your caregiver before blood is given. Blood is not given unless the benefits outweigh the risks. AFTER THE TRANSFUSION  Right after receiving a blood transfusion, you will usually feel much better and more energetic. This is especially true if your red blood cells have gotten low (anemic). The transfusion raises the level of the red blood cells which carry oxygen, and this usually causes an energy increase.  The nurse administering the transfusion will monitor you carefully for complications. HOME CARE INSTRUCTIONS  No special instructions are needed after a transfusion. You may find your energy is better. Speak with your caregiver about any limitations on activity for underlying diseases you may have. SEEK MEDICAL CARE IF:   Your condition is not improving after your transfusion.  You develop redness or irritation at the intravenous (IV) site. SEEK IMMEDIATE MEDICAL CARE IF:  Any of the following symptoms occur over the next 12 hours:  Shaking chills.  You have a temperature by mouth above 102 F (38.9 C), not controlled by medicine.  Chest, back, or muscle pain.  People around you feel you are not acting correctly or are confused.  Shortness of breath or difficulty breathing.  Dizziness and fainting.  You get a rash or develop hives.  You have a decrease in urine output.  Your urine turns a dark color or changes to pink, red, or brown. Any of the following symptoms occur over the next 10 days:  You have a temperature by mouth above 102 F (38.9 C), not controlled by medicine.  Shortness of breath.  Weakness after normal activity.  The white  part of the eye turns yellow (jaundice).  You have a decrease in the amount of urine or are urinating less often.  Your urine turns a dark color or changes to pink, red, or brown. Document Released: 04/29/2000 Document Revised: 07/25/2011 Document Reviewed: 12/17/2007 [facility] Patient Information 2014 [place], [place].  _______________________________________________________________________

## 2017-09-19 ENCOUNTER — Other Ambulatory Visit (INDEPENDENT_AMBULATORY_CARE_PROVIDER_SITE_OTHER): Payer: Self-pay

## 2017-09-19 ENCOUNTER — Encounter (HOSPITAL_COMMUNITY): Payer: Self-pay

## 2017-09-19 ENCOUNTER — Encounter (HOSPITAL_COMMUNITY)
Admission: RE | Admit: 2017-09-19 | Discharge: 2017-09-19 | Disposition: A | Discharge status: Home / Self Care | Payer: BC Managed Care – PPO | Source: Ambulatory Visit | Attending: Orthopaedic Surgery | Admitting: Orthopaedic Surgery

## 2017-09-19 ENCOUNTER — Other Ambulatory Visit: Payer: Self-pay

## 2017-09-19 DIAGNOSIS — I10 Essential (primary) hypertension: Secondary | ICD-10-CM | POA: Diagnosis not present

## 2017-09-19 DIAGNOSIS — M1612 Unilateral primary osteoarthritis, left hip: Secondary | ICD-10-CM | POA: Insufficient documentation

## 2017-09-19 DIAGNOSIS — Z01812 Encounter for preprocedural laboratory examination: Secondary | ICD-10-CM | POA: Insufficient documentation

## 2017-09-19 DIAGNOSIS — I4510 Unspecified right bundle-branch block: Secondary | ICD-10-CM | POA: Insufficient documentation

## 2017-09-19 DIAGNOSIS — Z01810 Encounter for preprocedural cardiovascular examination: Secondary | ICD-10-CM | POA: Diagnosis present

## 2017-09-19 HISTORY — DX: Essential (primary) hypertension: I10

## 2017-09-19 LAB — ABO/RH: ABO/RH(D): A POS

## 2017-09-19 LAB — CBC
HCT: 41.5 % (ref 36.0–46.0)
Hemoglobin: 13.7 g/dL (ref 12.0–15.0)
MCH: 30.5 pg (ref 26.0–34.0)
MCHC: 33 g/dL (ref 30.0–36.0)
MCV: 92.4 fL (ref 78.0–100.0)
PLATELETS: 322 10*3/uL (ref 150–400)
RBC: 4.49 MIL/uL (ref 3.87–5.11)
RDW: 14.4 % (ref 11.5–15.5)
WBC: 6.4 10*3/uL (ref 4.0–10.5)

## 2017-09-19 LAB — BASIC METABOLIC PANEL
Anion gap: 11 (ref 5–15)
BUN: 14 mg/dL (ref 6–20)
CO2: 26 mmol/L (ref 22–32)
Calcium: 9.4 mg/dL (ref 8.9–10.3)
Chloride: 105 mmol/L (ref 101–111)
Creatinine, Ser: 0.8 mg/dL (ref 0.44–1.00)
GFR calc Af Amer: >60 mL/min (ref >60)
GLUCOSE: 94 mg/dL (ref 65–99)
Potassium: 4.3 mmol/L (ref 3.5–5.1)
Sodium: 142 mmol/L (ref 135–145)

## 2017-09-19 LAB — SURGICAL PCR SCREEN
MRSA, PCR: NEGATIVE
STAPHYLOCOCCUS AUREUS: NEGATIVE

## 2017-09-22 ENCOUNTER — Other Ambulatory Visit: Payer: Self-pay

## 2017-09-22 ENCOUNTER — Inpatient Hospital Stay (HOSPITAL_COMMUNITY): Payer: BC Managed Care – PPO

## 2017-09-22 ENCOUNTER — Encounter (HOSPITAL_COMMUNITY): Payer: Self-pay | Admitting: *Deleted

## 2017-09-22 ENCOUNTER — Inpatient Hospital Stay (HOSPITAL_COMMUNITY): Payer: BC Managed Care – PPO | Admitting: Anesthesiology

## 2017-09-22 ENCOUNTER — Inpatient Hospital Stay (HOSPITAL_COMMUNITY)
Admission: RE | Admit: 2017-09-22 | Discharge: 2017-09-24 | DRG: 470 | Disposition: A | Discharge status: Home Health | Payer: BC Managed Care – PPO | Source: Ambulatory Visit | Attending: Orthopaedic Surgery | Admitting: Orthopaedic Surgery

## 2017-09-22 ENCOUNTER — Encounter (HOSPITAL_COMMUNITY)
Admission: RE | Disposition: A | Discharge status: Home Health | Payer: Self-pay | Source: Ambulatory Visit | Attending: Orthopaedic Surgery

## 2017-09-22 DIAGNOSIS — M25551 Pain in right hip: Secondary | ICD-10-CM

## 2017-09-22 DIAGNOSIS — Z87891 Personal history of nicotine dependence: Secondary | ICD-10-CM

## 2017-09-22 DIAGNOSIS — I10 Essential (primary) hypertension: Secondary | ICD-10-CM | POA: Diagnosis present

## 2017-09-22 DIAGNOSIS — Z6841 Body Mass Index (BMI) 40.0 and over, adult: Secondary | ICD-10-CM

## 2017-09-22 DIAGNOSIS — Z96642 Presence of left artificial hip joint: Secondary | ICD-10-CM

## 2017-09-22 DIAGNOSIS — M1612 Unilateral primary osteoarthritis, left hip: Secondary | ICD-10-CM | POA: Diagnosis present

## 2017-09-22 HISTORY — PX: TOTAL HIP ARTHROPLASTY: SHX124

## 2017-09-22 LAB — TYPE AND SCREEN
ABO/RH(D): A POS
Antibody Screen: NEGATIVE

## 2017-09-22 SURGERY — ARTHROPLASTY, HIP, TOTAL, ANTERIOR APPROACH
Anesthesia: Spinal | Site: Hip | Laterality: Left

## 2017-09-22 MED ORDER — ALUM & MAG HYDROXIDE-SIMETH 200-200-20 MG/5ML PO SUSP: 30.0000 mL | ORAL | Status: DC | PRN

## 2017-09-22 MED ORDER — DEXAMETHASONE SODIUM PHOSPHATE 10 MG/ML IJ SOLN
INTRAMUSCULAR | Status: AC
  Filled 2017-09-22: qty 1

## 2017-09-22 MED ORDER — PANTOPRAZOLE SODIUM 40 MG PO TBEC
40.0000 mg | DELAYED_RELEASE_TABLET | Freq: Every day | ORAL | Status: DC
  Administered 2017-09-23 – 2017-09-24 (×2): 40 mg via ORAL
  Filled 2017-09-22 (×2): qty 1

## 2017-09-22 MED ORDER — PROPOFOL 10 MG/ML IV BOLUS
INTRAVENOUS | Status: DC | PRN
  Administered 2017-09-22: 20 mg via INTRAVENOUS

## 2017-09-22 MED ORDER — CEFAZOLIN SODIUM-DEXTROSE 2-4 GM/100ML-% IV SOLN
2.0000 g | INTRAVENOUS | Status: AC
  Administered 2017-09-22: 2 g via INTRAVENOUS
  Filled 2017-09-22: qty 100

## 2017-09-22 MED ORDER — DIPHENHYDRAMINE HCL 12.5 MG/5ML PO ELIX: 12.5000 mg | ORAL_SOLUTION | ORAL | Status: DC | PRN

## 2017-09-22 MED ORDER — METOCLOPRAMIDE HCL 5 MG PO TABS: 5.0000 mg | ORAL_TABLET | Freq: Three times a day (TID) | ORAL | Status: DC | PRN

## 2017-09-22 MED ORDER — ZOLPIDEM TARTRATE 5 MG PO TABS: 5.0000 mg | ORAL_TABLET | Freq: Every evening | ORAL | Status: DC | PRN

## 2017-09-22 MED ORDER — DEXTROSE 5 % IV SOLN
500.0000 mg | Freq: Four times a day (QID) | INTRAVENOUS | Status: DC | PRN
  Administered 2017-09-22: 500 mg via INTRAVENOUS
  Filled 2017-09-22: qty 550

## 2017-09-22 MED ORDER — ACETAMINOPHEN 325 MG PO TABS: 325.0000 mg | ORAL_TABLET | Freq: Four times a day (QID) | ORAL | Status: DC | PRN

## 2017-09-22 MED ORDER — BUPIVACAINE IN DEXTROSE 0.75-8.25 % IT SOLN
INTRATHECAL | Status: DC | PRN
  Administered 2017-09-22: 2 mL via INTRATHECAL

## 2017-09-22 MED ORDER — SODIUM CHLORIDE 0.9 % IR SOLN
Status: DC | PRN
  Administered 2017-09-22: 1000 mL

## 2017-09-22 MED ORDER — CHLORHEXIDINE GLUCONATE 4 % EX LIQD: 60.0000 mL | Freq: Once | CUTANEOUS | Status: DC

## 2017-09-22 MED ORDER — STERILE WATER FOR IRRIGATION IR SOLN
Status: DC | PRN
  Administered 2017-09-22: 2000 mL

## 2017-09-22 MED ORDER — FENTANYL CITRATE (PF) 100 MCG/2ML IJ SOLN
INTRAMUSCULAR | Status: AC
  Filled 2017-09-22: qty 2

## 2017-09-22 MED ORDER — METOPROLOL SUCCINATE ER 50 MG PO TB24
100.0000 mg | ORAL_TABLET | Freq: Every day | ORAL | Status: DC
  Administered 2017-09-23 – 2017-09-24 (×2): 100 mg via ORAL
  Filled 2017-09-22 (×2): qty 2

## 2017-09-22 MED ORDER — LACTATED RINGERS IV SOLN
INTRAVENOUS | Status: DC
  Administered 2017-09-22 (×2): via INTRAVENOUS

## 2017-09-22 MED ORDER — PROPOFOL 10 MG/ML IV BOLUS
INTRAVENOUS | Status: AC
  Filled 2017-09-22: qty 20

## 2017-09-22 MED ORDER — PROPOFOL 500 MG/50ML IV EMUL
INTRAVENOUS | Status: DC | PRN
  Administered 2017-09-22: 120 ug/kg/min via INTRAVENOUS

## 2017-09-22 MED ORDER — MIDAZOLAM HCL 5 MG/5ML IJ SOLN
INTRAMUSCULAR | Status: DC | PRN
  Administered 2017-09-22: 2 mg via INTRAVENOUS

## 2017-09-22 MED ORDER — OXYCODONE HCL 5 MG PO TABS
5.0000 mg | ORAL_TABLET | ORAL | Status: DC | PRN
  Administered 2017-09-22 – 2017-09-23 (×5): 5 mg via ORAL
  Filled 2017-09-22 (×2): qty 1
  Filled 2017-09-22 (×2): qty 2
  Filled 2017-09-22: qty 1

## 2017-09-22 MED ORDER — PHENOL 1.4 % MT LIQD: 1.0000 | OROMUCOSAL | Status: DC | PRN

## 2017-09-22 MED ORDER — ONDANSETRON HCL 4 MG/2ML IJ SOLN
INTRAMUSCULAR | Status: AC
  Filled 2017-09-22: qty 2

## 2017-09-22 MED ORDER — AMLODIPINE BESYLATE 5 MG PO TABS
5.0000 mg | ORAL_TABLET | Freq: Every day | ORAL | Status: DC
  Administered 2017-09-23 – 2017-09-24 (×2): 5 mg via ORAL
  Filled 2017-09-22 (×2): qty 1

## 2017-09-22 MED ORDER — MIDAZOLAM HCL 2 MG/2ML IJ SOLN
INTRAMUSCULAR | Status: AC
  Filled 2017-09-22: qty 2

## 2017-09-22 MED ORDER — ONDANSETRON HCL 4 MG/2ML IJ SOLN: 4.0000 mg | Freq: Once | INTRAMUSCULAR | Status: DC | PRN

## 2017-09-22 MED ORDER — OXYCODONE HCL 5 MG PO TABS: 10.0000 mg | ORAL_TABLET | ORAL | Status: DC | PRN

## 2017-09-22 MED ORDER — PROPOFOL 10 MG/ML IV BOLUS
INTRAVENOUS | Status: AC
  Filled 2017-09-22: qty 60

## 2017-09-22 MED ORDER — DEXAMETHASONE SODIUM PHOSPHATE 10 MG/ML IJ SOLN
INTRAMUSCULAR | Status: DC | PRN
  Administered 2017-09-22: 10 mg via INTRAVENOUS

## 2017-09-22 MED ORDER — CEFAZOLIN SODIUM-DEXTROSE 2-4 GM/100ML-% IV SOLN
2.0000 g | Freq: Four times a day (QID) | INTRAVENOUS | Status: AC
  Administered 2017-09-22 – 2017-09-23 (×2): 2 g via INTRAVENOUS
  Filled 2017-09-22 (×2): qty 100

## 2017-09-22 MED ORDER — HYDROMORPHONE HCL 1 MG/ML IJ SOLN: 0.5000 mg | INTRAMUSCULAR | Status: DC | PRN

## 2017-09-22 MED ORDER — FENTANYL CITRATE (PF) 100 MCG/2ML IJ SOLN
INTRAMUSCULAR | Status: DC | PRN
  Administered 2017-09-22: 100 ug via INTRAVENOUS

## 2017-09-22 MED ORDER — METOCLOPRAMIDE HCL 5 MG/ML IJ SOLN: 5.0000 mg | Freq: Three times a day (TID) | INTRAMUSCULAR | Status: DC | PRN

## 2017-09-22 MED ORDER — POLYETHYLENE GLYCOL 3350 17 G PO PACK: 17.0000 g | PACK | Freq: Every day | ORAL | Status: DC | PRN

## 2017-09-22 MED ORDER — TRANEXAMIC ACID 1000 MG/10ML IV SOLN
1000.0000 mg | INTRAVENOUS | Status: AC
  Administered 2017-09-22: 1000 mg via INTRAVENOUS
  Filled 2017-09-22: qty 1100

## 2017-09-22 MED ORDER — ONDANSETRON HCL 4 MG PO TABS: 4.0000 mg | ORAL_TABLET | Freq: Four times a day (QID) | ORAL | Status: DC | PRN

## 2017-09-22 MED ORDER — DOCUSATE SODIUM 100 MG PO CAPS
100.0000 mg | ORAL_CAPSULE | Freq: Two times a day (BID) | ORAL | Status: DC
  Administered 2017-09-22 – 2017-09-24 (×3): 100 mg via ORAL
  Filled 2017-09-22 (×4): qty 1

## 2017-09-22 MED ORDER — METHOCARBAMOL 500 MG PO TABS
500.0000 mg | ORAL_TABLET | Freq: Four times a day (QID) | ORAL | Status: DC | PRN
  Administered 2017-09-23 (×2): 500 mg via ORAL
  Filled 2017-09-22 (×2): qty 1

## 2017-09-22 MED ORDER — ONDANSETRON HCL 4 MG/2ML IJ SOLN
INTRAMUSCULAR | Status: DC | PRN
  Administered 2017-09-22: 4 mg via INTRAVENOUS

## 2017-09-22 MED ORDER — ASPIRIN 81 MG PO CHEW
81.0000 mg | CHEWABLE_TABLET | Freq: Two times a day (BID) | ORAL | Status: DC
  Administered 2017-09-22 – 2017-09-24 (×4): 81 mg via ORAL
  Filled 2017-09-22 (×4): qty 1

## 2017-09-22 MED ORDER — SODIUM CHLORIDE 0.9 % IV SOLN
INTRAVENOUS | Status: DC
  Administered 2017-09-22: 16:00:00 via INTRAVENOUS

## 2017-09-22 MED ORDER — MENTHOL 3 MG MT LOZG: 1.0000 | LOZENGE | OROMUCOSAL | Status: DC | PRN

## 2017-09-22 MED ORDER — FENTANYL CITRATE (PF) 100 MCG/2ML IJ SOLN: 25.0000 ug | INTRAMUSCULAR | Status: DC | PRN

## 2017-09-22 MED ORDER — ONDANSETRON HCL 4 MG/2ML IJ SOLN: 4.0000 mg | Freq: Four times a day (QID) | INTRAMUSCULAR | Status: DC | PRN

## 2017-09-22 SURGICAL SUPPLY — 36 items
APL SKNCLS STERI-STRIP NONHPOA (GAUZE/BANDAGES/DRESSINGS)
BAG SPEC THK2 15X12 ZIP CLS (MISCELLANEOUS)
BAG ZIPLOCK 12X15 (MISCELLANEOUS) IMPLANT
BENZOIN TINCTURE PRP APPL 2/3 (GAUZE/BANDAGES/DRESSINGS) IMPLANT
BLADE SAW SGTL 18X1.27X75 (BLADE) ×2 IMPLANT
BLADE SAW SGTL 18X1.27X75MM (BLADE) ×1
CAPT HIP TOTAL 2 ×2 IMPLANT
CLOSURE WOUND 1/2 X4 (GAUZE/BANDAGES/DRESSINGS)
COVER PERINEAL POST (MISCELLANEOUS) ×3 IMPLANT
COVER SURGICAL LIGHT HANDLE (MISCELLANEOUS) ×3 IMPLANT
DRAPE STERI IOBAN 125X83 (DRAPES) ×3 IMPLANT
DRAPE U-SHAPE 47X51 STRL (DRAPES) ×6 IMPLANT
DRSG AQUACEL AG ADV 3.5X10 (GAUZE/BANDAGES/DRESSINGS) ×3 IMPLANT
DURAPREP 26ML APPLICATOR (WOUND CARE) ×3 IMPLANT
ELECT REM PT RETURN 15FT ADLT (MISCELLANEOUS) ×3 IMPLANT
GAUZE XEROFORM 1X8 LF (GAUZE/BANDAGES/DRESSINGS) ×2 IMPLANT
GLOVE BIO SURGEON STRL SZ7.5 (GLOVE) ×3 IMPLANT
GLOVE BIOGEL PI IND STRL 8 (GLOVE) ×2 IMPLANT
GLOVE BIOGEL PI INDICATOR 8 (GLOVE) ×4
GLOVE ECLIPSE 8.0 STRL XLNG CF (GLOVE) ×3 IMPLANT
GOWN STRL REUS W/TWL XL LVL3 (GOWN DISPOSABLE) ×6 IMPLANT
HANDPIECE INTERPULSE COAX TIP (DISPOSABLE) ×3
HOLDER FOLEY CATH W/STRAP (MISCELLANEOUS) ×3 IMPLANT
PACK ANTERIOR HIP CUSTOM (KITS) ×3 IMPLANT
SET HNDPC FAN SPRY TIP SCT (DISPOSABLE) ×1 IMPLANT
STAPLER VISISTAT 35W (STAPLE) IMPLANT
STRIP CLOSURE SKIN 1/2X4 (GAUZE/BANDAGES/DRESSINGS) IMPLANT
SUT ETHIBOND NAB CT1 #1 30IN (SUTURE) ×3 IMPLANT
SUT MNCRL AB 4-0 PS2 18 (SUTURE) IMPLANT
SUT VIC AB 0 CT1 36 (SUTURE) ×3 IMPLANT
SUT VIC AB 1 CT1 36 (SUTURE) ×3 IMPLANT
SUT VIC AB 2-0 CT1 27 (SUTURE) ×6
SUT VIC AB 2-0 CT1 TAPERPNT 27 (SUTURE) ×2 IMPLANT
TRAY FOLEY CATH 14FRSI W/METER (CATHETERS) ×2 IMPLANT
TRAY FOLEY MTR SLVR 16FR STAT (SET/KITS/TRAYS/PACK) ×1 IMPLANT
YANKAUER SUCT BULB TIP 10FT TU (MISCELLANEOUS) ×3 IMPLANT

## 2017-09-22 NOTE — Transfer of Care (Signed)
Immediate Anesthesia Transfer of Care Note  Patient: Julie Mcguire  Procedure(s) Performed: LEFT TOTAL HIP ARTHROPLASTY ANTERIOR APPROACH (Left Hip)  Patient Location: PACU  Anesthesia Type:Spinal  Level of Consciousness: awake  Airway & Oxygen Therapy: Patient Spontanous Breathing and Patient connected to face mask oxygen  Post-op Assessment: Report given to RN and Post -op Vital signs reviewed and stable  Post vital signs: Reviewed and stable  Last Vitals:  Vitals Value Taken Time  BP    Temp    Pulse 79 09/22/2017  2:52 PM  Resp    SpO2 100 % 09/22/2017  2:52 PM  Vitals shown include unvalidated device data.  Last Pain:  Vitals:   09/22/17 1157  TempSrc:   PainSc: 3       Patients Stated Pain Goal: 4 (09/22/17 1157)  Complications: No apparent anesthesia complications

## 2017-09-22 NOTE — H&P (Signed)
TOTAL HIP ADMISSION H&P  Patient is admitted for left total hip arthroplasty.  Subjective:  Chief Complaint: left hip pain  HPI: Julie Mcguire, 62 y.o. female, has a history of pain and functional disability in the left hip(s) due to arthritis and patient has failed non-surgical conservative treatments for greater than 12 weeks to include NSAID's and/or analgesics, corticosteriod injections, use of assistive devices, weight reduction as appropriate and activity modification.  Onset of symptoms was gradual starting 3 years ago with gradually worsening course since that time.The patient noted no past surgery on the left hip(s).  Patient currently rates pain in the left hip at 10 out of 10 with activity. Patient has night pain, worsening of pain with activity and weight bearing, trendelenberg gait, pain that interfers with activities of daily living and pain with passive range of motion. Patient has evidence of subchondral cysts, subchondral sclerosis, periarticular osteophytes and joint space narrowing by imaging studies. This condition presents safety issues increasing the risk of falls.  There is no current active infection.  Patient Active Problem List   Diagnosis Date Noted  . Unilateral primary osteoarthritis, left hip 08/30/2017   Past Medical History:  Diagnosis Date  . Hypertension     Past Surgical History:  Procedure Laterality Date  . ABDOMINAL HYSTERECTOMY    . APPENDECTOMY    . CHOLECYSTECTOMY    . TONSILLECTOMY      Current Facility-Administered Medications  Medication Dose Route Frequency Provider Last Rate Last Dose  . ceFAZolin (ANCEF) IVPB 2g/100 mL premix  2 g Intravenous On Call to OR Kirtland Bouchard, PA-C      . chlorhexidine (HIBICLENS) 4 % liquid 4 application  60 mL Topical Once Richardean Canal W, PA-C      . lactated ringers infusion   Intravenous Continuous Ellender, Catheryn Bacon, MD 20 mL/hr at 09/22/17 1206    . tranexamic acid (CYKLOKAPRON) 1,000 mg in sodium  chloride 0.9 % 100 mL IVPB  1,000 mg Intravenous To OR Kathryne Hitch, MD       Allergies  Allergen Reactions  . Codeine Nausea And Vomiting    Social History   Tobacco Use  . Smoking status: Former Smoker    Packs/day: 1.00    Years: 15.00    Pack years: 15.00    Last attempt to quit: 05/17/1995    Years since quitting: 22.3  . Smokeless tobacco: Never Used  Substance Use Topics  . Alcohol use: Yes    Comment: occas    History reviewed. No pertinent family history.   Review of Systems  Musculoskeletal: Positive for joint pain.  All other systems reviewed and are negative.   Objective:  Physical Exam  Constitutional: She is oriented to person, place, and time. She appears well-developed and well-nourished.  HENT:  Head: Normocephalic and atraumatic.  Eyes: Pupils are equal, round, and reactive to light. EOM are normal.  Neck: Normal range of motion. Neck supple.  Cardiovascular: Normal rate and regular rhythm.  Respiratory: Effort normal and breath sounds normal.  GI: Soft. Bowel sounds are normal.  Musculoskeletal:       Left hip: She exhibits decreased range of motion, decreased strength, tenderness and bony tenderness.  Neurological: She is alert and oriented to person, place, and time.  Skin: Skin is warm and dry.  Psychiatric: She has a normal mood and affect.    Vital signs in last 24 hours: Temp:  [97.8 F (36.6 C)] 97.8 F (36.6 C) (05/10 1100)  Pulse Rate:  [89] 89 (05/10 1100) Resp:  [18] 18 (05/10 1100) SpO2:  [96 %] 96 % (05/10 1100) Weight:  [247 lb (112 kg)] 247 lb (112 kg) (05/10 1155)  Labs:   Estimated body mass index is 43.07 kg/m as calculated from the following:   Height as of this encounter: 5' 3.5" (1.613 m).   Weight as of this encounter: 247 lb (112 kg).   Imaging Review Plain radiographs demonstrate severe degenerative joint disease of the left hip(s). The bone quality appears to be good for age and reported activity  level.    Preoperative templating of the joint replacement has been completed, documented, and submitted to the Operating Room personnel in order to optimize intra-operative equipment management.   Anticipated LOS equal to or greater than 2 midnights due to - Age 53 and older with one or more of the following:  - Obesity  - Expected need for hospital services (PT, OT, Nursing) required for safe  discharge  - Anticipated need for postoperative skilled nursing care or inpatient rehab  - Active co-morbidities: None OR   - Unanticipated findings during/Post Surgery: Slow post-op progression: GI, pain control, mobility  - Patient is a high risk of re-admission due to: None     Assessment/Plan:  End stage arthritis, left hip(s)  The patient history, physical examination, clinical judgement of the provider and imaging studies are consistent with end stage degenerative joint disease of the left hip(s) and total hip arthroplasty is deemed medically necessary. The treatment options including medical management, injection therapy, arthroscopy and arthroplasty were discussed at length. The risks and benefits of total hip arthroplasty were presented and reviewed. The risks due to aseptic loosening, infection, stiffness, dislocation/subluxation,  thromboembolic complications and other imponderables were discussed.  The patient acknowledged the explanation, agreed to proceed with the plan and consent was signed. Patient is being admitted for inpatient treatment for surgery, pain control, PT, OT, prophylactic antibiotics, VTE prophylaxis, progressive ambulation and ADL's and discharge planning.The patient is planning to be discharged home with home health services

## 2017-09-22 NOTE — Anesthesia Preprocedure Evaluation (Addendum)
Anesthesia Evaluation  Patient identified by MRN, date of birth, ID band Patient awake    Reviewed: Allergy & Precautions, NPO status , Patient's Chart, lab work & pertinent test results  Airway Mallampati: II  TM Distance: >3 FB Neck ROM: Full    Dental no notable dental hx.    Pulmonary former smoker,    Pulmonary exam normal breath sounds clear to auscultation       Cardiovascular hypertension, Pt. on medications and Pt. on home beta blockers Normal cardiovascular exam Rhythm:Regular Rate:Normal  ECG: NSR, rate 76. Incomplete RBBB   Neuro/Psych negative neurological ROS  negative psych ROS   GI/Hepatic negative GI ROS, Neg liver ROS,   Endo/Other  Morbid obesity  Renal/GU negative Renal ROS     Musculoskeletal  (+) Arthritis , Osteoarthritis,    Abdominal (+) + obese,   Peds  Hematology negative hematology ROS (+)   Anesthesia Other Findings osteoarthritis left hip  Reproductive/Obstetrics                            Anesthesia Physical Anesthesia Plan  ASA: III  Anesthesia Plan: Spinal   Post-op Pain Management:    Induction: Intravenous  PONV Risk Score and Plan: 2 and Ondansetron, Scopolamine patch - Pre-op, Propofol infusion and Treatment may vary due to age or medical condition  Airway Management Planned: Natural Airway  Additional Equipment:   Intra-op Plan:   Post-operative Plan:   Informed Consent: I have reviewed the patients History and Physical, chart, labs and discussed the procedure including the risks, benefits and alternatives for the proposed anesthesia with the patient or authorized representative who has indicated his/her understanding and acceptance.   Dental advisory given  Plan Discussed with: CRNA  Anesthesia Plan Comments:         Anesthesia Quick Evaluation

## 2017-09-22 NOTE — Anesthesia Procedure Notes (Signed)
Date/Time: 09/22/2017 12:59 PM Performed by: Florene Route, CRNA Oxygen Delivery Method: Simple face mask

## 2017-09-22 NOTE — Anesthesia Procedure Notes (Signed)
Spinal  Patient location during procedure: OR Start time: 09/22/2017 12:59 PM End time: 09/22/2017 1:04 PM Staffing Resident/CRNA: Sharlette Dense, CRNA Performed: resident/CRNA  Preanesthetic Checklist Completed: patient identified, site marked, surgical consent, pre-op evaluation, timeout performed, IV checked, risks and benefits discussed and monitors and equipment checked Spinal Block Patient position: sitting Prep: Betadine Patient monitoring: heart rate, continuous pulse ox and blood pressure Approach: midline Location: L3-4 Injection technique: single-shot Needle Needle type: Pencan  Needle gauge: 24 G Needle length: 9 cm Additional Notes Kit expiration date 11/14/2018 and lot #[id number] Functioning IV. Skin wheal with Lidocaine 1% Clear free flow CSF, negative heme, negative paresthesia Tolerated well and returned to supine position

## 2017-09-22 NOTE — Brief Op Note (Signed)
09/22/2017  2:38 PM  PATIENT:  Julie Mcguire  62 y.o. female  PRE-OPERATIVE DIAGNOSIS:  osteoarthritis left hip  POST-OPERATIVE DIAGNOSIS:  osteoarthritis left hip  PROCEDURE:  Procedure(s): LEFT TOTAL HIP ARTHROPLASTY ANTERIOR APPROACH (Left)  SURGEON:  Surgeon(s) and Role:    Kathryne Hitch, MD - Primary  PHYSICIAN ASSISTANT: Rexene Edison, PA-C  ANESTHESIA:   spinal  EBL:  350 mL   COUNTS:  YES  DICTATION: .Other Dictation: Dictation Number [id number]  PLAN OF CARE: Admit to inpatient   PATIENT DISPOSITION:  PACU - hemodynamically stable.   Delay start of Pharmacological VTE agent (>24hrs) due to surgical blood loss or risk of bleeding: no

## 2017-09-22 NOTE — Op Note (Signed)
NAME: Julie Mcguire, Julie Mcguire MEDICAL RECORD YN:[id number] ACCOUNT [id number] DATE OF BIRTH:01-17-1956 FACILITY: [facility] LOCATION: [facility] PHYSICIAN:Nox Talent Aretha Parrot, MD  OPERATIVE REPORT  DATE OF PROCEDURE:  09/22/2017  PREOPERATIVE DIAGNOSIS:  Primary osteoarthritis and degenerative joint disease, left hip.  POSTOPERATIVE DIAGNOSIS:  Primary osteoarthritis and degenerative joint disease, left hip.  PROCEDURE:  Left total hip arthroplasty, direct anterior approach.  IMPLANTS:  DePuy Sector Gription acetabular component size 54 with 2 screws, size 36+0 polyethylene liner, size 11 Corail femoral component with a standard offset, size 36 -2 metal hip ball.  SURGEON:  Vanita Panda. Magnus Ivan, MD  ASSISTANT:  Edward Jolly, PA-C   ANESTHESIA:  Spinal.  ANTIBIOTICS:  Two grams IV Ancef.  BLOOD LOSS:  250 mL.  COMPLICATIONS:  None.  INDICATIONS:  The patient is a very pleasant 62 year old female with severe debilitating arthritis involving her left hip.  Her pain has become daily, and it is detrimentally affecting her activities of daily living, her quality of life, her mobility.   Her hip x-rays show complete loss of the femoral head with flattening and significant loss of the joint space and erosion of the hip.  Her leg is short on the left and the right as well.  At this point, she does wish to proceed with a total hip  arthroplasty.  She understands fully the risk of acute blood loss anemia, nerve or vessel injury, fracture, infection, DVT and dislocation.  She understands her goals are to decrease pain, improve mobility and overall improve quality of life.  DESCRIPTION OF PROCEDURE:  After informed consent was obtained, appropriate left hip was marked.  She was brought to the operating room.  Spinal anesthesia was obtained while she was on a stretcher.  A Foley catheter was placed, and both feet had  traction boots applied to the neck.  She was placed supine on the Hana fracture table  with a perineal post in place.  Both legs in in-line skeletal traction device and no traction applied.  Her left operative hip was prepped and draped using DuraPrep and  sterile drapes.  A timeout was called.  She was identified as correct patient, correct left hip.  We then made an incision just inferior and posterior to the anterior-superior iliac spine and carried obliquely down the leg.  We dissected down tensor  fascia lata muscle.  Tensor fascia was then divided longitudinally to proceed with direct anterior approach to the hip.  We identified and cauterized the circumflex vessels and identified the hip capsule and opened up the hip capsule in L-type format,  finding a very large joint effusion and significant disease within her left hip.  We then made a femoral neck cut with an oscillating saw just proximal to the lesser trochanter and completed this with an osteotome.  We placed a corkscrew guide in the  femoral head and removed the femoral head in its entirety and found it to be completely flattened and devoid of cartilage.  We then placed a bent Hohmann into the medial acetabular rim and cleaned the rim of the acetabular labrum and other debris.  We  then began reaming from a size 44 reamer, going all the way up to a size 53 with all reamers under direct visualization, the last reamer under direct fluoroscopy so we could obtain our depth for reaming by inclination and anteversion.  We then placed the  real DePuy sector Gription acetabular component size 54 and 2 screws and a 36+0 neutral polyethylene liner for that  size acetabular component.  Attention was then turned to the femur.  With the leg externally rotated to 120 degrees, extended and  adducted, we were able to place a Mueller retractor medially and a Hohmann retractor around the greater trochanter.  We released the lateral joint capsule and used a box-cutting osteotome to enter the femoral canal and a rongeur to lateralize.  We then  began  broaching from a size 8 broach using our broaching system, going up to a size 11.  Based on her previous anatomy, a trial of standard offset femoral neck and we went with a 36 -2 hip ball based on her anatomy as well, reducing the acetabulum.  We  were pleased with the leg length, offset, range of motion and stability.  We then irrigated the soft tissues with normal saline solution.  We dislocated the hip, removed the trial components.  We then placed the real size 11 femoral component with  standard offset and the real 36 -2 metal hip ball and again reduced the acetabulum and again we were pleased with stability.  We then closed the joint capsule with interrupted #1 Ethibond suture, closed the tensor fascia with a #1 running Vicryl suture,  0 Vicryl in the deep tissue, 2-0 Vicryl subcutaneous tissue with interrupted staples on the skin.  Xeroform and an Aquacel dressing was applied.  She was taken off the Hana table, taken to recovery room in stable condition.  All final counts were  correct.  There were no complications noted.  Of note, Edward Jolly, PA-C, assisted the entire case.  His assistance was crucial for facilitating all aspects of this case.  LN/NUANCE  D:09/22/2017 T:09/22/2017 JOB:[id number]

## 2017-09-23 LAB — BASIC METABOLIC PANEL
Anion gap: 11 (ref 5–15)
BUN: 10 mg/dL (ref 6–20)
CHLORIDE: 104 mmol/L (ref 101–111)
CO2: 26 mmol/L (ref 22–32)
CREATININE: 0.65 mg/dL (ref 0.44–1.00)
Calcium: 9.2 mg/dL (ref 8.9–10.3)
GFR calc Af Amer: >60 mL/min (ref >60)
GFR calc non Af Amer: >60 mL/min (ref >60)
GLUCOSE: 123 mg/dL — AB (ref 65–99)
POTASSIUM: 3.7 mmol/L (ref 3.5–5.1)
Sodium: 141 mmol/L (ref 135–145)

## 2017-09-23 LAB — CBC
HEMATOCRIT: 38.5 % (ref 36.0–46.0)
HEMOGLOBIN: 12.4 g/dL (ref 12.0–15.0)
MCH: 29.3 pg (ref 26.0–34.0)
MCHC: 32.2 g/dL (ref 30.0–36.0)
MCV: 91 fL (ref 78.0–100.0)
Platelets: 334 10*3/uL (ref 150–400)
RBC: 4.23 MIL/uL (ref 3.87–5.11)
RDW: 14.4 % (ref 11.5–15.5)
WBC: 15.6 10*3/uL — ABNORMAL HIGH (ref 4.0–10.5)

## 2017-09-23 MED ORDER — SULFAMETHOXAZOLE-TRIMETHOPRIM 800-160 MG PO TABS: 1.0000 | ORAL_TABLET | Freq: Two times a day (BID) | ORAL | 0 refills | Status: DC

## 2017-09-23 MED ORDER — METHOCARBAMOL 500 MG PO TABS: 500.0000 mg | ORAL_TABLET | Freq: Four times a day (QID) | ORAL | 0 refills | Status: AC | PRN

## 2017-09-23 MED ORDER — OXYCODONE HCL 5 MG PO TABS: 5.0000 mg | ORAL_TABLET | ORAL | 0 refills | Status: DC | PRN

## 2017-09-23 MED ORDER — ASPIRIN 81 MG PO CHEW: 81.0000 mg | CHEWABLE_TABLET | Freq: Two times a day (BID) | ORAL | 0 refills | Status: DC

## 2017-09-23 NOTE — Progress Notes (Signed)
Occupational Therapy Evaluation Patient Details Name: Julie Mcguire MRN: [id number] DOB: August 27, 1955 Today's Date: 09/23/2017    History of Present Illness s/p left THA, anterior approach   Clinical Impression   All OT education completed and pt questions answered. No further OT needs at this time. Will sign off.    Follow Up Recommendations  No OT follow up;Supervision/Assistance - 24 hour    Equipment Recommendations  3 in 1 bedside commode    Recommendations for Other Services       Precautions / Restrictions Precautions Precautions: None Restrictions Other Position/Activity Restrictions: WBAT      Mobility Bed Mobility                  Transfers                      Balance                                           ADL either performed or assessed with clinical judgement   ADL Overall ADL's : Needs assistance/impaired                                       General ADL Comments: Patient reports she has been toileting with nursing without difficulty ambulating to bathroom with RW. Educated patient on LB dressing techniques and she verbalized understanding. Demonstrated shower transfer with walker and she verbalized understanding and declined to practice. She states her husband and daughter will assist with ADLs as needed.     Vision         Perception     Praxis      Pertinent Vitals/Pain Pain Assessment: 0-10 Pain Score: 1  Pain Location: left hip Pain Descriptors / Indicators: Sore Pain Intervention(s): Limited activity within patient's tolerance;Monitored during session     Hand Dominance     Extremity/Trunk Assessment Upper Extremity Assessment Upper Extremity Assessment: Overall WFL for tasks assessed   Lower Extremity Assessment Lower Extremity Assessment: Defer to PT evaluation       Communication Communication Communication: No difficulties   Cognition Arousal/Alertness:  Awake/alert Behavior During Therapy: WFL for tasks assessed/performed Overall Cognitive Status: Within Functional Limits for tasks assessed                                     General Comments       Exercises     Shoulder Instructions      Home Living Family/patient expects to be discharged to:: Private residence Living Arrangements: Spouse/significant other Available Help at Discharge: Family Type of Home: House             Bathroom Shower/Tub: Producer, television/film/video: Standard Bathroom Accessibility: Yes How Accessible: Accessible via walker Home Equipment: None   Additional Comments: daughter will also assist as needed      Prior Functioning/Environment Level of Independence: Independent                 OT Problem List: Decreased strength;Decreased activity tolerance;Decreased knowledge of use of DME or AE;Pain      OT Treatment/Interventions:      OT Goals(Current goals can be found in the care  plan section) Acute Rehab OT Goals Patient Stated Goal: to get to the beach soon OT Goal Formulation: All assessment and education complete, DC therapy  OT Frequency:     Barriers to D/C:            Co-evaluation              AM-PAC PT "6 Clicks" Daily Activity     Outcome Measure Help from another person eating meals?: None Help from another person taking care of personal grooming?: None Help from another person toileting, which includes using toliet, bedpan, or urinal?: A Little Help from another person bathing (including washing, rinsing, drying)?: A Little Help from another person to put on and taking off regular upper body clothing?: None Help from another person to put on and taking off regular lower body clothing?: A Little 6 Click Score: 21   End of Session    Activity Tolerance: Patient tolerated treatment well Patient left: in bed;with call bell/phone within reach;with family/visitor present  OT Visit  Diagnosis: Muscle weakness (generalized) (M62.81)                Time: 5409-8119 OT Time Calculation (min): 13 min Charges:  OT General Charges $OT Visit: 1 Visit OT Evaluation $OT Eval Low Complexity: 1 Low G-Codes:       Julie Mcguire A Tannen Vandezande 10-15-17, 1:28 PM

## 2017-09-23 NOTE — Progress Notes (Signed)
Subjective: 1 Day Post-Op Procedure(s) (LRB): LEFT TOTAL HIP ARTHROPLASTY ANTERIOR APPROACH (Left) Patient reports pain as moderate.    Objective: Vital signs in last 24 hours: Temp:  [97.6 F (36.4 C)-98.5 F (36.9 C)] 98.5 F (36.9 C) (05/11 0045) Pulse Rate:  [52-93] 93 (05/11 0542) Resp:  [12-19] 18 (05/11 0542) BP: (151-181)/(82-99) 151/83 (05/11 0542) SpO2:  [95 %-99 %] 97 % (05/11 0542) Weight:  [247 lb (112 kg)] 247 lb (112 kg) (05/10 1155)  Intake/Output from previous day: 05/10 0701 - 05/11 0700 In: 3916.3 [P.O.:720; I.V.:2886.3; IV Piggyback:310] Out: 4100 [Urine:3750; Blood:350] Intake/Output this shift: Total I/O In: 240 [P.O.:240] Out: 200 [Urine:200]  Recent Labs    09/23/17 0548  HGB 12.4   Recent Labs    09/23/17 0548  WBC 15.6*  RBC 4.23  HCT 38.5  PLT 334   Recent Labs    09/23/17 0548  NA 141  K 3.7  CL 104  CO2 26  BUN 10  CREATININE 0.65  GLUCOSE 123*  CALCIUM 9.2   No results for input(s): LABPT, INR in the last 72 hours.  Sensation intact distally Intact pulses distally Dorsiflexion/Plantar flexion intact Incision: dressing C/D/I  Assessment/Plan: 1 Day Post-Op Procedure(s) (LRB): LEFT TOTAL HIP ARTHROPLASTY ANTERIOR APPROACH (Left) Up with therapy Discharge home with home health Sunday vs Monday depending on her progress with therapy and pain control.    Kathryne Hitch 09/23/2017, 9:00 AM

## 2017-09-23 NOTE — Evaluation (Signed)
Physical Therapy Evaluation Patient Details Name: Julie Mcguire MRN: [id number] DOB: [birth year] Today's Date: 09/23/2017   History of Present Illness  s/p left THA, anterior approach  Clinical Impression  The patient is progressing well. m Plans Dc home. Pt admitted with above diagnosis. Pt currently with functional limitations due to the deficits listed below (see PT Problem List).  Pt will benefit from skilled PT to increase their independence and safety with mobility to allow discharge to the venue listed below.       Follow Up Recommendations Follow surgeon's recommendation for DC plan and follow-up therapies    Equipment Recommendations  Rolling walker with 5" wheels    Recommendations for Other Services       Precautions / Restrictions Precautions Precautions: Fall Restrictions Other Position/Activity Restrictions: WBAT      Mobility  Bed Mobility Overal bed mobility: Needs Assistance Bed Mobility: Supine to Sit;Sit to Supine     Supine to sit: Mod assist Sit to supine: Mod assist   General bed mobility comments: asisst with both legs  Transfers Overall transfer level: Needs assistance Equipment used: Rolling walker (2 wheeled) Transfers: Sit to/from Stand Sit to Stand: Min assist         General transfer comment: cues for hand placement  Ambulation/Gait Ambulation/Gait assistance: Min assist Ambulation Distance (Feet): 80 Feet Assistive device: Rolling walker (2 wheeled) Gait Pattern/deviations: Step-to pattern;Decreased step length - left;Decreased stance time - left;Trunk flexed     General Gait Details: cues for sequence  Stairs            Wheelchair Mobility    Modified Rankin (Stroke Patients Only)       Balance                                             Pertinent Vitals/Pain Pain Assessment: 0-10 Pain Score: 3  Pain Location: left hip/groin Pain Descriptors / Indicators: Sore Pain Intervention(s):  Monitored during session;Premedicated before session;Ice applied    Home Living Family/patient expects to be discharged to:: Private residence Living Arrangements: Spouse/significant other Available Help at Discharge: Family Type of Home: House Home Access: Stairs to enter   Secretary/administrator of Steps: 1 Home Layout: One level Home Equipment: None Additional Comments: daughter will also assist as needed    Prior Function Level of Independence: Independent         Comments: but limited in distance     Hand Dominance        Extremity/Trunk Assessment   Upper Extremity Assessment Upper Extremity Assessment: Overall WFL for tasks assessed    Lower Extremity Assessment Lower Extremity Assessment: LLE deficits/detail LLE Deficits / Details: requires assistanc eto lift leg onto bed    Cervical / Trunk Assessment Cervical / Trunk Assessment: Normal  Communication   Communication: No difficulties  Cognition Arousal/Alertness: Awake/alert Behavior During Therapy: WFL for tasks assessed/performed Overall Cognitive Status: Within Functional Limits for tasks assessed                                        General Comments      Exercises Total Joint Exercises Ankle Circles/Pumps: AROM;Both;10 reps;Supine Quad Sets: AROM;Both;10 reps;Supine Short Arc Quad: AROM;Left;10 reps;Supine Heel Slides: AAROM;Left;10 reps;Supine Hip ABduction/ADduction: AAROM;Left;10 reps;Supine   Assessment/Plan  PT Assessment Patient needs continued PT services  PT Problem List Decreased strength;Decreased range of motion;Decreased activity tolerance;Decreased mobility;Decreased knowledge of precautions;Decreased safety awareness;Decreased knowledge of use of DME;Pain       PT Treatment Interventions DME instruction;Gait training;Stair training;Functional mobility training;Patient/family education;Therapeutic activities;Therapeutic exercise    PT Goals (Current  goals can be found in the Care Plan section)  Acute Rehab PT Goals Patient Stated Goal: [place] the beach soon PT Goal Formulation: With patient/family Time For Goal Achievement: 09/27/17 Potential to Achieve Goals: Good    Frequency 7X/week   Barriers to discharge        Co-evaluation               AM-PAC PT "6 Clicks" Daily Activity  Outcome Measure Difficulty turning over in bed (including adjusting bedclothes, sheets and blankets)?: A Lot Difficulty moving from lying on back to sitting on the side of the bed? : A Lot Difficulty sitting down on and standing up from a chair with arms (e.g., wheelchair, bedside commode, etc,.)?: A Lot Help needed moving to and from a bed to chair (including a wheelchair)?: A Lot Help needed walking in hospital room?: A Lot Help needed climbing 3-5 steps with a railing? : Total 6 Click Score: 11    End of Session   Activity Tolerance: Patient tolerated treatment well Patient left: in bed;with call bell/phone within reach;with family/visitor present Nurse Communication: Mobility status PT Visit Diagnosis: Unsteadiness on feet (R26.81)    Time: 4098-1191 PT Time Calculation (min) (ACUTE ONLY): 44 min   Charges:   PT Evaluation $PT Eval Low Complexity: 1 Low PT Treatments $Gait Training: 8-22 mins $Therapeutic Exercise: 8-22 mins   PT G CodesBlanchard Kelch PT [phone]   Rada Hay 09/23/2017, 4:16 PM

## 2017-09-23 NOTE — Progress Notes (Signed)
Physical Therapy Treatment Patient Details Name: Julie Mcguire MRN: [id number] DOB: 1955-06-04 Today's Date: 09/23/2017    History of Present Illness s/p left THA, anterior approach    PT Comments    Progressing well. Plans Dc home tomorrow.    Follow Up Recommendations  Follow surgeon's recommendation for DC plan and follow-up therapies     Equipment Recommendations  Rolling walker with 5" wheels    Recommendations for Other Services       Precautions / Restrictions Precautions Precautions: Fall Restrictions Other Position/Activity Restrictions: WBAT    Mobility  Bed Mobility Overal bed mobility: Needs Assistance Bed Mobility: Sit to Supine     Supine to sit: Mod assist Sit to supine: Mod assist   General bed mobility comments: asisst with both legs onto bed  Transfers Overall transfer level: Needs assistance Equipment used: Rolling walker (2 wheeled) Transfers: Sit to/from Stand Sit to Stand: Supervision         General transfer comment: cues for hand placement  Ambulation/Gait      General Gait Details: just returned from bathroom, declined another walk   Stairs             Wheelchair Mobility    Modified Rankin (Stroke Patients Only)       Balance                                            Cognition Arousal/Alertness: Awake/alert Behavior During Therapy: WFL for tasks assessed/performed Overall Cognitive Status: Within Functional Limits for tasks assessed                                        Exercises     General Comments        Pertinent Vitals/Pain Pain Assessment: 0-10 Pain Score: 3  Pain Location: left hip/groin Pain Descriptors / Indicators: Sore Pain Intervention(s): Monitored during session;Patient requesting pain meds-RN notified;Ice applied    Home Living Family/patient expects to be discharged to:: Private residence Living Arrangements: Spouse/significant  other Available Help at Discharge: Family Type of Home: House Home Access: Stairs to enter   Home Layout: One level Home Equipment: None Additional Comments: daughter will also assist as needed    Prior Function Level of Independence: Independent      Comments: but limited in distance   PT Goals (current goals can now be found in the care plan section) Acute Rehab PT Goals Patient Stated Goal: to get to the beach soon PT Goal Formulation: With patient/family Time For Goal Achievement: 09/27/17 Potential to Achieve Goals: Good Progress towards PT goals: Progressing toward goals    Frequency    7X/week      PT Plan Current plan remains appropriate    Co-evaluation              AM-PAC PT "6 Clicks" Daily Activity  Outcome Measure  Difficulty turning over in bed (including adjusting bedclothes, sheets and blankets)?: A Lot Difficulty moving from lying on back to sitting on the side of the bed? : A Lot Difficulty sitting down on and standing up from a chair with arms (e.g., wheelchair, bedside commode, etc,.)?: A Lot Help needed moving to and from a bed to chair (including a wheelchair)?: A Lot Help needed walking in hospital room?: A  Lot Help needed climbing 3-5 steps with a railing? : Total 6 Click Score: 11    End of Session   Activity Tolerance: Patient tolerated treatment well Patient left: in bed;with call bell/phone within reach;with family/visitor present Nurse Communication: Mobility status PT Visit Diagnosis: Unsteadiness on feet (R26.81)     Time: 1610-9604 PT Time Calculation (min) (ACUTE ONLY): 15 min  Charges: $Therapeutic Activity: 8-22 mins                    G CodesBlanchard Kelch PT [phone]    Rada Hay 09/23/2017, 4:19 PM

## 2017-09-23 NOTE — Plan of Care (Signed)
Plan of care discussed.   

## 2017-09-24 NOTE — Progress Notes (Signed)
     Subjective: 2 Days Post-Op Procedure(s) (LRB): LEFT TOTAL HIP ARTHROPLASTY ANTERIOR APPROACH (Left)Awake, alert and oriented x 4. I am ready to go.  Patient reports pain as mild.    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    LABS Recent Labs    09/23/17 0548  HGB 12.4  WBC 15.6*  PLT 334   Recent Labs    09/23/17 0548  NA 141  K 3.7  CL 104  CO2 26  BUN 10  CREATININE 0.65  GLUCOSE 123*   No results for input(s): LABPT, INR in the last 72 hours.   Assessment/Plan: 2 Days Post-Op Procedure(s) (LRB): LEFT TOTAL HIP ARTHROPLASTY ANTERIOR APPROACH (Left)  Advance diet Up with therapy D/C IV fluids Discharge home with home health  Vira Browns 09/24/2017, 9:16 AMPatient ID: Julie Mcguire, female   DOB: [birth year], 62 y.o.   MRN: [id number]

## 2017-09-24 NOTE — Progress Notes (Signed)
Physical Therapy Treatment Patient Details Name: Julie Mcguire MRN: [id number] DOB: 01/11/1956 Today's Date: 09/24/2017    History of Present Illness s/p left THA, anterior approach    PT Comments    Pt ambulated in hallway and practiced one step.  Pt slow with mobility however overall present as min/guard assist.  Pt performed LE exercises.  Pt reports d/c home today with daughter assist.  Follow Up Recommendations  Follow surgeon's recommendation for DC plan and follow-up therapies     Equipment Recommendations  Rolling walker with 5" wheels    Recommendations for Other Services       Precautions / Restrictions Precautions Precautions: Fall Restrictions Weight Bearing Restrictions: No Other Position/Activity Restrictions: WBAT    Mobility  Bed Mobility Overal bed mobility: Needs Assistance Bed Mobility: Supine to Sit     Supine to sit: Min guard;HOB elevated     General bed mobility comments: pt self assisting with grab bar and hand rails, increased time and effort  Transfers Overall transfer level: Needs assistance Equipment used: Rolling walker (2 wheeled) Transfers: Sit to/from Stand Sit to Stand: Min guard         General transfer comment: verbal cues for hand placement, and to control descent  Ambulation/Gait Ambulation/Gait assistance: Min guard Ambulation Distance (Feet): 160 Feet Assistive device: Rolling walker (2 wheeled) Gait Pattern/deviations: Step-to pattern;Decreased stance time - left;Trunk flexed;Decreased step length - left     General Gait Details: verbal cues for sequence, step length, posture   Stairs Stairs: Yes Stairs assistance: Min guard Stair Management: Step to pattern;Forwards;With walker Number of Stairs: 1 General stair comments: verbal cues for sequence and safety, pt performed once and reports understanding, pt did not feel she needed to repeat   Wheelchair Mobility    Modified Rankin (Stroke Patients Only)       Balance                                            Cognition Arousal/Alertness: Awake/alert Behavior During Therapy: WFL for tasks assessed/performed Overall Cognitive Status: Within Functional Limits for tasks assessed                                        Exercises Total Joint Exercises Ankle Circles/Pumps: AROM;Both;10 reps;Supine Quad Sets: AROM;Both;10 reps;Supine Heel Slides: AAROM;Left;10 reps;Supine Hip ABduction/ADduction: AAROM;Left;10 reps;Supine Long Arc Quad: AROM;10 reps;Left;Seated Marching in Standing: AAROM;Left;Seated;10 reps    General Comments        Pertinent Vitals/Pain Pain Assessment: 0-10 Pain Score: 2  Pain Location: left hip Pain Descriptors / Indicators: Sore Pain Intervention(s): Limited activity within patient's tolerance;Repositioned;Monitored during session;Ice applied    Home Living                      Prior Function            PT Goals (current goals can now be found in the care plan section) Progress towards PT goals: Progressing toward goals    Frequency    7X/week      PT Plan Current plan remains appropriate    Co-evaluation              AM-PAC PT "6 Clicks" Daily Activity  Outcome Measure  Difficulty turning over in bed (including  adjusting bedclothes, sheets and blankets)?: A Lot Difficulty moving from lying on back to sitting on the side of the bed? : A Lot Difficulty sitting down on and standing up from a chair with arms (e.g., wheelchair, bedside commode, etc,.)?: A Lot Help needed moving to and from a bed to chair (including a wheelchair)?: A Little Help needed walking in hospital room?: A Little Help needed climbing 3-5 steps with a railing? : A Little 6 Click Score: 15    End of Session Equipment Utilized During Treatment: Gait belt Activity Tolerance: Patient tolerated treatment well Patient left: with call bell/phone within reach;in chair   PT  Visit Diagnosis: Difficulty in walking, not elsewhere classified (R26.2)     Time: 1610-9604 PT Time Calculation (min) (ACUTE ONLY): 23 min  Charges:  $Gait Training: 8-22 mins $Therapeutic Exercise: 8-22 mins                    G Codes:       Zenovia Jarred, PT, DPT 09/24/2017 Pager: [phone]  Maida Sale E 09/24/2017, 12:52 PM

## 2017-09-24 NOTE — Discharge Instructions (Signed)

## 2017-09-24 NOTE — Discharge Summary (Signed)
Patient ID: Julie Mcguire MRN: [id number] DOB/AGE: [birth year] 62 y.o.  Admit date: 09/22/2017 Discharge date: 09/24/2017  Admission Diagnoses:  Principal Problem:   Unilateral primary osteoarthritis, left hip Active Problems:   Status post total replacement of left hip   Discharge Diagnoses:  Same  Past Medical History:  Diagnosis Date  . Hypertension     Surgeries: Procedure(s): LEFT TOTAL HIP ARTHROPLASTY ANTERIOR APPROACH on 09/22/2017   Consultants:   Discharged Condition: Improved  Hospital Course: Julie Mcguire is an 62 y.o. female who was admitted 09/22/2017 for operative treatment ofUnilateral primary osteoarthritis, left hip. Patient has severe unremitting pain that affects sleep, daily activities, and work/hobbies. After pre-op clearance the patient was taken to the operating room on 09/22/2017 and underwent  Procedure(s): LEFT TOTAL HIP ARTHROPLASTY ANTERIOR APPROACH.    Patient was given perioperative antibiotics:  Anti-infectives (From admission, onward)   Start     Dose/Rate Route Frequency Ordered Stop   09/23/17 0000  sulfamethoxazole-trimethoprim (BACTRIM DS,SEPTRA DS) 800-160 MG tablet     1 tablet Oral 2 times daily 09/23/17 0904     09/22/17 1830  ceFAZolin (ANCEF) IVPB 2g/100 mL premix     2 g 200 mL/hr over 30 Minutes Intravenous Every 6 hours 09/22/17 1609 09/23/17 0112   09/22/17 1145  ceFAZolin (ANCEF) IVPB 2g/100 mL premix     2 g 200 mL/hr over 30 Minutes Intravenous On call to O.R. 09/22/17 1134 09/22/17 1321       Patient was given sequential compression devices, early ambulation, and chemoprophylaxis to prevent DVT.  Patient benefited maximally from hospital stay and there were no complications.    Recent vital signs:  Patient Vitals for the past 24 hrs:  BP Temp Temp src Pulse Resp SpO2  09/24/17 0557 (!) 149/87 98.8 F (37.1 C) Oral (!) 130 15 94 %  09/23/17 2228 (!) 155/78 98 F (36.7 C) Oral (!) 105 15 91 %     Recent  laboratory studies:  Recent Labs    09/23/17 0548  WBC 15.6*  HGB 12.4  HCT 38.5  PLT 334  NA 141  K 3.7  CL 104  CO2 26  BUN 10  CREATININE 0.65  GLUCOSE 123*  CALCIUM 9.2     Discharge Medications:   Allergies as of 09/24/2017      Reactions   Codeine Nausea And Vomiting      Medication List    STOP taking these medications   diclofenac 75 MG EC tablet Commonly known as:  VOLTAREN   doxycycline 100 MG tablet Commonly known as:  VIBRA-TABS   traMADol 50 MG tablet Commonly known as:  ULTRAM     TAKE these medications   acetaminophen 500 MG tablet Commonly known as:  TYLENOL Take 1,000 mg by mouth 3 (three) times daily. With tramadol for hip pain.   amLODipine 5 MG tablet Commonly known as:  NORVASC Take 5 mg by mouth daily.   aspirin 81 MG chewable tablet Chew 1 tablet (81 mg total) by mouth 2 (two) times daily.   methocarbamol 500 MG tablet Commonly known as:  ROBAXIN Take 1 tablet (500 mg total) by mouth every 6 (six) hours as needed for muscle spasms.   metoprolol succinate 100 MG 24 hr tablet Commonly known as:  TOPROL-XL Take 100 mg by mouth daily.   oxyCODONE 5 MG immediate release tablet Commonly known as:  Oxy IR/ROXICODONE Take 1-2 tablets (5-10 mg total) by mouth every 4 (four) hours as  needed for moderate pain (pain score 4-6).   sulfamethoxazole-trimethoprim 800-160 MG tablet Commonly known as:  BACTRIM DS,SEPTRA DS Take 1 tablet by mouth 2 (two) times daily.       Diagnostic Studies: Dg Pelvis Portable  Result Date: 09/22/2017 CLINICAL DATA:  Status post left hip replacement. EXAM: PORTABLE PELVIS 1-2 VIEWS COMPARISON:  Earlier today.  Pelvis CT dated FINDINGS: Left total hip prosthesis in satisfactory position and alignment. No fracture or dislocation. Lower lumbar spine degenerative changes. IMPRESSION: Satisfactory postoperative appearance of a left total hip prosthesis. Electronically Signed   By: Beckie Salts M.D.   On:  09/22/2017 17:05   Dg C-arm 1-60 Min-no Report  Result Date: 09/22/2017 Fluoroscopy was utilized by the requesting physician.  No radiographic interpretation.   Dg Hip Operative Unilat With Pelvis Left  Result Date: 09/22/2017 CLINICAL DATA:  Left hip replacement EXAM: OPERATIVE LEFT HIP WITH PELVIS COMPARISON:  None. FLUOROSCOPY TIME:  Radiation Exposure Index (as provided by the fluoroscopic device): 7.8 mGy If the device does not provide the exposure index: Fluoroscopy Time:  39 seconds Number of Acquired Images:  2 FINDINGS: Left hip replacement is noted. No acute bony or soft tissue abnormality is seen. IMPRESSION: Left hip replacement Electronically Signed   By: Alcide Clever M.D.   On: 09/22/2017 15:53    Disposition:   Discharge Instructions    Call MD / Call 911   Complete by:  As directed    If you experience chest pain or shortness of breath, CALL 911 and be transported to the hospital emergency room.  If you develope a fever above 101 F, pus (white drainage) or increased drainage or redness at the wound, or calf pain, call your surgeon's office.   Constipation Prevention   Complete by:  As directed    Drink plenty of fluids.  Prune juice may be helpful.  You may use a stool softener, such as Colace (over the counter) 100 mg twice a day.  Use MiraLax (over the counter) for constipation as needed.   Diet - low sodium heart healthy   Complete by:  As directed    Discharge instructions   Complete by:  As directed    INSTRUCTIONS AFTER JOINT REPLACEMENT   Remove items at home which could result in a fall. This includes throw rugs or furniture in walking pathways ICE to the affected joint every three hours while awake for 30 minutes at a time, for at least the first 3-5 days, and then as needed for pain and swelling.  Continue to use ice for pain and swelling. You may notice swelling that will progress down to the foot and ankle.  This is normal after surgery.  Elevate your leg when  you are not up walking on it.   Continue to use the breathing machine you got in the hospital (incentive spirometer) which will help keep your temperature down.  It is common for your temperature to cycle up and down following surgery, especially at night when you are not up moving around and exerting yourself.  The breathing machine keeps your lungs expanded and your temperature down.   DIET:  As you were doing prior to hospitalization, we recommend a well-balanced diet.  DRESSING / WOUND CARE / SHOWERING  Keep the surgical dressing until follow up.  The dressing is water proof, so you can shower without any extra covering.  IF THE DRESSING FALLS OFF or the wound gets wet inside, change the dressing with sterile  gauze.  Please use good hand washing techniques before changing the dressing.  Do not use any lotions or creams on the incision until instructed by your surgeon.    ACTIVITY  Increase activity slowly as tolerated, but follow the weight bearing instructions below.   No driving for 6 weeks or until further direction given by your physician.  You cannot drive while taking narcotics.  No lifting or carrying greater than 10 lbs. until further directed by your surgeon. Avoid periods of inactivity such as sitting longer than an hour when not asleep. This helps prevent blood clots.  You may return to work once you are authorized by your doctor.     WEIGHT BEARING   Weight bearing as tolerated with assist device (walker, cane, etc) as directed, use it as long as suggested by your surgeon or therapist, typically at least 4-6 weeks.   EXERCISES  Results after joint replacement surgery are often greatly improved when you follow the exercise, range of motion and muscle strengthening exercises prescribed by your doctor. Safety measures are also important to protect the joint from further injury. Any time any of these exercises cause you to have increased pain or swelling, decrease what you are  doing until you are comfortable again and then slowly increase them. If you have problems or questions, call your caregiver or physical therapist for advice.   Rehabilitation is important following a joint replacement. After just a few days of immobilization, the muscles of the leg can become weakened and shrink (atrophy).  These exercises are designed to build up the tone and strength of the thigh and leg muscles and to improve motion. Often times heat used for twenty to thirty minutes before working out will loosen up your tissues and help with improving the range of motion but do not use heat for the first two weeks following surgery (sometimes heat can increase post-operative swelling).   These exercises can be done on a training (exercise) mat, on the floor, on a table or on a bed. Use whatever works the best and is most comfortable for you.    Use music or television while you are exercising so that the exercises are a pleasant break in your day. This will make your life better with the exercises acting as a break in your routine that you can look forward to.   Perform all exercises about fifteen times, three times per day or as directed.  You should exercise both the operative leg and the other leg as well.  Exercises include:   Quad Sets - Tighten up the muscle on the front of the thigh (Quad) and hold for 5-10 seconds.   Straight Leg Raises - With your knee straight (if you were given a brace, keep it on), lift the leg to 60 degrees, hold for 3 seconds, and slowly lower the leg.  Perform this exercise against resistance later as your leg gets stronger.  Leg Slides: Lying on your back, slowly slide your foot toward your buttocks, bending your knee up off the floor (only go as far as is comfortable). Then slowly slide your foot back down until your leg is flat on the floor again.  Angel Wings: Lying on your back spread your legs to the side as far apart as you can without causing discomfort.   Hamstring Strength:  Lying on your back, push your heel against the floor with your leg straight by tightening up the muscles of your buttocks.  Repeat, but this  time bend your knee to a comfortable angle, and push your heel against the floor.  You may put a pillow under the heel to make it more comfortable if necessary.   A rehabilitation program following joint replacement surgery can speed recovery and prevent re-injury in the future due to weakened muscles. Contact your doctor or a physical therapist for more information on knee rehabilitation.    CONSTIPATION  Constipation is defined medically as fewer than three stools per week and severe constipation as less than one stool per week.  Even if you have a regular bowel pattern at home, your normal regimen is likely to be disrupted due to multiple reasons following surgery.  Combination of anesthesia, postoperative narcotics, change in appetite and fluid intake all can affect your bowels.   YOU MUST use at least one of the following options; they are listed in order of increasing strength to get the job done.  They are all available over the counter, and you may need to use some, POSSIBLY even all of these options:    Drink plenty of fluids (prune juice may be helpful) and high fiber foods Colace 100 mg by mouth twice a day  Senokot for constipation as directed and as needed Dulcolax (bisacodyl), take with full glass of water  Miralax (polyethylene glycol) once or twice a day as needed.  If you have tried all these things and are unable to have a bowel movement in the first 3-4 days after surgery call either your surgeon or your primary doctor.    If you experience loose stools or diarrhea, hold the medications until you stool forms back up.  If your symptoms do not get better within 1 week or if they get worse, check with your doctor.  If you experience "the worst abdominal pain ever" or develop nausea or vomiting, please contact the office  immediately for further recommendations for treatment.   ITCHING:  If you experience itching with your medications, try taking only a single pain pill, or even half a pain pill at a time.  You can also use Benadryl over the counter for itching or also to help with sleep.   TED HOSE STOCKINGS:  Use stockings on both legs until for at least 2 weeks or as directed by physician office. They may be removed at night for sleeping.  MEDICATIONS:  See your medication summary on the "After Visit Summary" that nursing will review with you.  You may have some home medications which will be placed on hold until you complete the course of blood thinner medication.  It is important for you to complete the blood thinner medication as prescribed.  PRECAUTIONS:  If you experience chest pain or shortness of breath - call 911 immediately for transfer to the hospital emergency department.   If you develop a fever greater that 101 F, purulent drainage from wound, increased redness or drainage from wound, foul odor from the wound/dressing, or calf pain - CONTACT YOUR SURGEON.                                                   FOLLOW-UP APPOINTMENTS:  If you do not already have a post-op appointment, please call the office for an appointment to be seen by your surgeon.  Guidelines for how soon to be seen are listed in your "After  Visit Summary", but are typically between 1-4 weeks after surgery.  OTHER INSTRUCTIONS:   Knee Replacement:  Do not place pillow under knee, focus on keeping the knee straight while resting. CPM instructions: 0-90 degrees, 2 hours in the morning, 2 hours in the afternoon, and 2 hours in the evening. Place foam block, curve side up under heel at all times except when in CPM or when walking.  DO NOT modify, tear, cut, or change the foam block in any way.  MAKE SURE YOU:  Understand these instructions.  Get help right away if you are not doing well or get worse.    Thank you for letting us be a  part of your medical care team.  It is a privilege we respect greatly.  We hope these instructions will help you stay on track for a fast and full recovery!   Driving restrictions   Complete by:  As directed    No driving for 3 weeks   Increase activity slowly as tolerated   Complete by:  As directed    Lifting restrictions   Complete by:  As directed    No lifting for 6 weeks      Follow-up Information    Kathryne Hitch, MD Follow up in 2 week(s).   Specialty:  Orthopedic Surgery Contact information: [address] [place] [address] [phone]        [facility] Follow up.   Specialty:  Home Health Services Why:  Physical therapist will contact you for first visit by Tuesday.  Contact information: [address] [place] [address] [phone]            Signed: Kathryne Hitch 09/24/2017, 7:10 PM

## 2017-09-24 NOTE — Progress Notes (Signed)
Patient discharged to home, all discharge medications and instructions reviewed and questions answered.  Patient to be assisted to vehicle by wheelchair.  

## 2017-09-24 NOTE — Anesthesia Postprocedure Evaluation (Signed)
Anesthesia Post Note  Patient: Julie Mcguire  Procedure(s) Performed: LEFT TOTAL HIP ARTHROPLASTY ANTERIOR APPROACH (Left Hip)     Patient location during evaluation: PACU Anesthesia Type: Spinal Level of consciousness: oriented and awake and alert Pain management: pain level controlled Vital Signs Assessment: post-procedure vital signs reviewed and stable Respiratory status: spontaneous breathing, respiratory function stable and patient connected to nasal cannula oxygen Cardiovascular status: blood pressure returned to baseline and stable Postop Assessment: no headache, no backache and no apparent nausea or vomiting Anesthetic complications: no    Last Vitals:  Vitals:   09/23/17 2228 09/24/17 0557  BP: (!) 155/78 (!) 149/87  Pulse: (!) 105 (!) 130  Resp: 15 15  Temp: 36.7 C 37.1 C  SpO2: 91% 94%    Last Pain:  Vitals:   09/24/17 1214  TempSrc:   PainSc: 3                  Ryan P Ellender

## 2017-09-24 NOTE — Care Management Note (Signed)
Case Management Note  Patient Details  Name: Julie Mcguire MRN: [id number] Date of Birth: [birth year]  Subjective/Objective:  Pt presents for left total hip.                 Action/Plan: Pt has no preference of HH agency.  Agreeable to [facility].  Jermaine with [facility] accepted referral.   Expected Discharge Date:  09/24/17               Expected Discharge Plan:  Home w Home Health Services  In-House Referral:  NA  Discharge planning Services  CM Consult  Post Acute Care Choice:  Durable Medical Equipment, Home Health Choice offered to:  Patient  DME Arranged:  Walker rolling DME Agency:  [facility]  HH Arranged:  PT [facility] Agency:  [facility]  Status of Service:  Completed, signed off  If discussed at Long Length of Stay Meetings, dates discussed:    Additional Comments:  Deveron Furlong, RN 09/24/2017, 11:53 AM

## 2017-09-26 ENCOUNTER — Telehealth (INDEPENDENT_AMBULATORY_CARE_PROVIDER_SITE_OTHER): Payer: Self-pay | Admitting: Orthopaedic Surgery

## 2017-09-26 NOTE — Telephone Encounter (Signed)
Verbal order left on VM  

## 2017-09-26 NOTE — Telephone Encounter (Signed)
Ryan w/[facility] needs verbal orders for this patient   Twice a week for this week and 3x for next week, then once a week for the 3rd week.  [phone]

## 2017-10-05 ENCOUNTER — Encounter (INDEPENDENT_AMBULATORY_CARE_PROVIDER_SITE_OTHER): Payer: Self-pay | Admitting: Orthopaedic Surgery

## 2017-10-05 ENCOUNTER — Ambulatory Visit (INDEPENDENT_AMBULATORY_CARE_PROVIDER_SITE_OTHER): Payer: BC Managed Care – PPO | Admitting: Orthopaedic Surgery

## 2017-10-05 DIAGNOSIS — Z96642 Presence of left artificial hip joint: Secondary | ICD-10-CM

## 2017-10-05 NOTE — Progress Notes (Signed)
The patient is 2 weeks tomorrow status post a left total hip arthroplasty direct injury approach.  We have had her on antibiotics postoperatively due to her morbid obesity but also she has had lesions on her skin preoperative that we treated until she had no lesions for surgery.  She is happy overall and is ambulating with a walker but feels like she can transition to a cane by next week.  Her pain is minimal and she is off narcotics now.  On exam her incision looks good so remove the staples and placed Steri-Strips.  There is no significant seroma.  I showed her how want to keep her cough in the body fold area where her pannus comes over incision.  Overall though she looks good.  She continue increase her activities as she is comfortable.  She may return to work potentially in a few weeks instead of in 6 weeks postoperative if she is doing well.  She can drive from my standpoint as well.  All questions concerns were answered and addressed.  We will see how she is doing 4 weeks with no x-rays are needed.

## 2017-10-16 ENCOUNTER — Telehealth (INDEPENDENT_AMBULATORY_CARE_PROVIDER_SITE_OTHER): Payer: Self-pay | Admitting: Orthopaedic Surgery

## 2017-10-16 NOTE — Telephone Encounter (Signed)
Patient called asked if Dr Magnus IvanBlackman would write her a note to return to work half days the week of June 10th and the week of June 17th. The number to contact patient is [phone]

## 2017-10-16 NOTE — Telephone Encounter (Signed)
That will be fine. 

## 2017-10-16 NOTE — Telephone Encounter (Signed)
Please advise 

## 2017-10-17 ENCOUNTER — Encounter (INDEPENDENT_AMBULATORY_CARE_PROVIDER_SITE_OTHER): Payer: Self-pay

## 2017-10-17 NOTE — Telephone Encounter (Signed)
LMOM for patient letting her know note is at front desk for her

## 2017-10-26 ENCOUNTER — Telehealth (INDEPENDENT_AMBULATORY_CARE_PROVIDER_SITE_OTHER): Payer: Self-pay | Admitting: Orthopaedic Surgery

## 2017-10-26 NOTE — Telephone Encounter (Signed)
Patient called asked if Dr Magnus IvanBlackman would extend her being out of work for 2 more weeks working half days. The number to contact patient is [phone]

## 2017-10-27 ENCOUNTER — Encounter (INDEPENDENT_AMBULATORY_CARE_PROVIDER_SITE_OTHER): Payer: Self-pay

## 2017-10-27 NOTE — Telephone Encounter (Signed)
Note is ready for patient if she calls back. I have called her but her "voicemail hasn't been set up yet"

## 2017-11-06 ENCOUNTER — Ambulatory Visit (INDEPENDENT_AMBULATORY_CARE_PROVIDER_SITE_OTHER): Payer: BC Managed Care – PPO | Admitting: Orthopaedic Surgery

## 2017-11-06 ENCOUNTER — Encounter (INDEPENDENT_AMBULATORY_CARE_PROVIDER_SITE_OTHER): Payer: Self-pay | Admitting: Orthopaedic Surgery

## 2017-11-06 DIAGNOSIS — Z96642 Presence of left artificial hip joint: Secondary | ICD-10-CM

## 2017-11-06 NOTE — Progress Notes (Signed)
The patient is now 6 weeks status post a left total hip arthroplasty.  She is ready to go back to work at half days for few weeks and go back to full days.  She does have some start up pain when she first gets up but is overall doing well.  She has no significant issues.  On examination her incision looks good.  Her leg lengths are equal.  She tells me putting her hip the range of motion does not cause significant pain.  This point should continue increase her activities as comfort allows.  We will see her back in 6 months with a low AP pelvis and lateral of her left operative hip.  All questions concerns were answered and addressed.

## 2017-12-19 ENCOUNTER — Ambulatory Visit (INDEPENDENT_AMBULATORY_CARE_PROVIDER_SITE_OTHER): Payer: BC Managed Care – PPO | Admitting: Physician Assistant

## 2017-12-19 ENCOUNTER — Encounter (INDEPENDENT_AMBULATORY_CARE_PROVIDER_SITE_OTHER): Payer: Self-pay | Admitting: Physician Assistant

## 2017-12-19 DIAGNOSIS — M170 Bilateral primary osteoarthritis of knee: Secondary | ICD-10-CM | POA: Diagnosis not present

## 2017-12-19 MED ORDER — LIDOCAINE HCL 1 % IJ SOLN
0.5000 mL | INTRAMUSCULAR | Status: AC | PRN
  Administered 2017-12-19: .5 mL

## 2017-12-19 MED ORDER — METHYLPREDNISOLONE ACETATE 40 MG/ML IJ SUSP
40.0000 mg | INTRAMUSCULAR | Status: AC | PRN
  Administered 2017-12-19: 40 mg via INTRA_ARTICULAR

## 2017-12-19 NOTE — Progress Notes (Signed)
Office Visit Note   Patient: Julie MarionDeborah M Mcguire           Date of Birth: [birth year]           MRN: [id number] Visit Date: 12/19/2017              Requested by: Elias Elseeade, Robert, MD [phone] [address] [place], [place] [address] PCP: Elias Elseeade, Robert, MD   Assessment & Plan: Visit Diagnoses:  1. Primary osteoarthritis of both knees     Plan: She will follow-up at 6 months postop for a left total hip as scheduled.  At that time we will obtain AP lateral views of both knees.  She understands that she can only have cortisone injections every 3 months.  Discussed with her quad strengthening exercises.    Follow-Up Instructions: Return in about 4 months (around 04/26/2018) for Radiographs.   Orders:  Orders Placed This Encounter  Procedures  . Large Joint Inj   No orders of the defined types were placed in this encounter.     Procedures: Large Joint Inj: bilateral knee on 12/19/2017 11:52 AM Indications: pain Details: 22 G 1.5 in needle, anterolateral approach  Arthrogram: No  Medications (Right): 0.5 mL lidocaine 1 %; 40 mg methylPREDNISolone acetate 40 MG/ML Medications (Left): 0.5 mL lidocaine 1 %; 40 mg methylPREDNISolone acetate 40 MG/ML Outcome: tolerated well, no immediate complications Procedure, treatment alternatives, risks and benefits explained, specific risks discussed. Consent was given by the patient. Immediately prior to procedure a time out was called to verify the correct patient, procedure, equipment, support staff and site/side marked as required. Patient was prepped and draped in the usual sterile fashion.       Clinical Data: No additional findings.   Subjective: Chief Complaint  Patient presents with  . Left Knee - Follow-up  . Right Knee - Follow-up    HPI Julie Mcguire comes in today for bilateral knee pain.  She has known arthritis of both knees.  She brings with her a CD which includes AP view of both knees and a lateral view of the left knee.   These are dated 03/16/2017.  Reviewed the films and they show bone-on-bone medial compartment of the right knee and lateral compartmental moderate changes.  Left knee AP and lateral views show tricompartmental changes moderate medial compartment mild to moderate lateral compartment and moderate to severe patellofemoral compartment. She has had cortisone injections in both knees in the past but only one injection each knee.  She states this is helped.  To the best of her recollection this was at least back in March of this year that she had a cortisone injection in 1 of the knees.  She had no particular injury to either knee.  However she does report coming off of a curb and has had increased pain in the left knee since then.  Currently her left knee bothers her more than the right.  She is 88 days status post left total hip arthroplasty by Dr. Magnus IvanBlackman is doing well.  Patient is nondiabetic. Review of Systems  She denies any recent fevers chills.. Please see HPI otherwise negative  Objective: Vital Signs: There were no vitals taken for this visit.  Physical Exam  Constitutional: She is oriented to person, place, and time. She appears well-developed and well-nourished. No distress.  Pulmonary/Chest: Effort normal.  Neurological: She is alert and oriented to person, place, and time.  Skin: She is not diaphoretic.  Psychiatric: She has a normal mood  and affect.    Ortho Exam Bilateral knees full extension flexion beyond 90 degrees.  No instability valgus varus stressing.  No abnormal warmth erythema or effusion of either knee.  Left knee she has tenderness along medial lateral joint lines.  Right knee no significant joint line tenderness. Specialty Comments:  No specialty comments available.  Imaging: No results found.   PMFS History: Patient Active Problem List   Diagnosis Date Noted  . 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 Unilateral primary osteoarthritis, left hip  08/30/2017   Past Medical History:  Diagnosis Date  . Hypertension     No family history on file.  Past Surgical History:  Procedure Laterality Date  . ABDOMINAL HYSTERECTOMY    . APPENDECTOMY    . CHOLECYSTECTOMY    . TONSILLECTOMY    . TOTAL HIP ARTHROPLASTY Left 09/22/2017   Procedure: LEFT TOTAL HIP ARTHROPLASTY ANTERIOR APPROACH;  Surgeon: Kathryne Hitch, MD;  Location: [facility];  Service: Orthopedics;  Laterality: Left;   Social History   Occupational History  . Not on file  Tobacco Use  . Smoking status: Former Smoker    Packs/day: 1.00    Years: 15.00    Pack years: 15.00    Last attempt to quit: 05/17/1995    Years since quitting: 22.6  . Smokeless tobacco: Never Used  Substance and Sexual Activity  . Alcohol use: Yes    Comment: occas  . Drug use: Never  . Sexual activity: Yes

## 2018-04-26 ENCOUNTER — Ambulatory Visit (INDEPENDENT_AMBULATORY_CARE_PROVIDER_SITE_OTHER): Payer: Self-pay

## 2018-04-26 ENCOUNTER — Encounter (INDEPENDENT_AMBULATORY_CARE_PROVIDER_SITE_OTHER): Payer: Self-pay | Admitting: Orthopaedic Surgery

## 2018-04-26 ENCOUNTER — Ambulatory Visit (INDEPENDENT_AMBULATORY_CARE_PROVIDER_SITE_OTHER): Payer: BC Managed Care – PPO | Admitting: Orthopaedic Surgery

## 2018-04-26 ENCOUNTER — Telehealth (INDEPENDENT_AMBULATORY_CARE_PROVIDER_SITE_OTHER): Payer: Self-pay | Admitting: Radiology

## 2018-04-26 DIAGNOSIS — G8929 Other chronic pain: Secondary | ICD-10-CM | POA: Diagnosis not present

## 2018-04-26 DIAGNOSIS — M1712 Unilateral primary osteoarthritis, left knee: Secondary | ICD-10-CM

## 2018-04-26 DIAGNOSIS — M25561 Pain in right knee: Secondary | ICD-10-CM

## 2018-04-26 DIAGNOSIS — M25562 Pain in left knee: Secondary | ICD-10-CM

## 2018-04-26 DIAGNOSIS — M1711 Unilateral primary osteoarthritis, right knee: Secondary | ICD-10-CM

## 2018-04-26 DIAGNOSIS — M25552 Pain in left hip: Secondary | ICD-10-CM

## 2018-04-26 DIAGNOSIS — Z96642 Presence of left artificial hip joint: Secondary | ICD-10-CM

## 2018-04-26 MED ORDER — METHYLPREDNISOLONE ACETATE 40 MG/ML IJ SUSP
40.0000 mg | INTRAMUSCULAR | Status: AC | PRN
  Administered 2018-04-26: 40 mg via INTRA_ARTICULAR

## 2018-04-26 MED ORDER — LIDOCAINE HCL 1 % IJ SOLN
3.0000 mL | INTRAMUSCULAR | Status: AC | PRN
  Administered 2018-04-26: 3 mL

## 2018-04-26 NOTE — Telephone Encounter (Signed)
Please get approval for Hyaluronic Acid for bilateral knees.

## 2018-04-26 NOTE — Progress Notes (Signed)
Office Visit Note   Patient: Julie MarionDeborah M Mcguire           Date of Birth: 26-Mar-1956           MRN: [id number] Visit Date: 04/26/2018              Requested by: Elias Elseeade, Robert, MD [phone] [address] [place], [place] [address] PCP: Elias Elseeade, Robert, MD   Assessment & Plan: Visit Diagnoses:  1. Status post total replacement of left hip   2. Chronic pain of both knees   3. Unilateral primary osteoarthritis, right knee   4. Unilateral primary osteoarthritis, left knee     Plan: Today I was able to successfully place steroid injections in both knees.  I do feel that ordering hyaluronic acid for her knees is appropriate and she does as well.  She feels that this would help from a pain standpoint and then she can mobilize better and lose weight.  She also tried to hold off on knee replacement surgery.  We will see her back in 4 weeks to hopefully place hyaluronic acid injections in both knees.  All question concerns were answered and addressed.  Follow-Up Instructions: Return in about 4 weeks (around 05/24/2018).   Orders:  Orders Placed This Encounter  Procedures  . Large Joint Inj  . Large Joint Inj  . XR HIP UNILAT W OR W/O PELVIS 2-3 VIEWS LEFT  . XR KNEE 3 VIEW RIGHT  . XR KNEE 3 VIEW LEFT   No orders of the defined types were placed in this encounter.     Procedures: Large Joint Inj: R knee on 04/26/2018 4:59 PM Indications: diagnostic evaluation and pain Details: 22 G 1.5 in needle, superolateral approach  Arthrogram: No  Medications: 3 mL lidocaine 1 %; 40 mg methylPREDNISolone acetate 40 MG/ML Outcome: tolerated well, no immediate complications Procedure, treatment alternatives, risks and benefits explained, specific risks discussed. Consent was given by the patient. Immediately prior to procedure a time out was called to verify the correct patient, procedure, equipment, support staff and site/side marked as required. Patient was prepped and draped in the usual sterile  fashion.   Large Joint Inj: L knee on 04/26/2018 4:59 PM Indications: diagnostic evaluation and pain Details: 22 G 1.5 in needle, superolateral approach  Arthrogram: No  Medications: 3 mL lidocaine 1 %; 40 mg methylPREDNISolone acetate 40 MG/ML Outcome: tolerated well, no immediate complications Procedure, treatment alternatives, risks and benefits explained, specific risks discussed. Consent was given by the patient. Immediately prior to procedure a time out was called to verify the correct patient, procedure, equipment, support staff and site/side marked as required. Patient was prepped and draped in the usual sterile fashion.       Clinical Data: No additional findings.   Subjective: Chief Complaint  Patient presents with  . Left Hip - Pain  . Right Knee - Pain  . Left Knee - Pain  The patient is a very pleasant 62 year old female well-known to me.  She is 7 months status post a left total hip arthroplasty.  She said that left hip was done wonderful for her and she has no issues with the hip at all.  She has been dealing with severe bilateral knee pain is been slowly getting worse.  The right is worse than left both heard.  She has problems going down stairs but more problems going up stairs.  Even when she walks she can see a significant limp in her gait.  She is someone who is morbidly obese.  She has had steroid injections in her knees about 4 months ago.  She would like to continue a conservative course of treatment and try steroid injections again today and consider having hyaluronic acid injections in both knees in a month from now.  She denies any groin pain.  Again she says her left hip is been doing well with no issues at all.  HPI  Review of Systems She currently denies any headache, chest pain, shortness of breath, fever, chills, nausea, vomiting.  Objective: Vital Signs: There were no vitals taken for this visit.  Physical Exam She is alert and oriented x3 and in  no acute distress Ortho Exam Examination of her left hip shows that it moves smoothly and normally.  Her right hip has normal exam as well.  Both knees have varus malalignment.  Both knees have significant patellofemoral crepitation.  Both knees have full range of motion and are ligamentously stable but have significant medial and lateral joint line tenderness as well. Specialty Comments:  No specialty comments available.  Imaging: Xr Hip Unilat W Or W/o Pelvis 2-3 Views Left  Result Date: 04/26/2018 An AP pelvis and lateral left hip shows a total hip arthroplasty with no complicating features.  Xr Knee 3 View Left  Result Date: 04/26/2018 3 views of the left knee show moderate tricompartmental arthritic changes.  The most severe disease is at the patellofemoral joint.  Xr Knee 3 View Right  Result Date: 04/26/2018 3 views of the right knee show severe tricompartmental arthritic changes.  There are large particular osteophytes in all 3 compartments.  There is significant patellofemoral disease.  There is varus malalignment as well as almost complete loss of the medial joint space.    PMFS History: Patient Active Problem List   Diagnosis Date Noted  . 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 Unilateral primary osteoarthritis, left hip 08/30/2017   Past Medical History:  Diagnosis Date  . Hypertension     History reviewed. No pertinent family history.  Past Surgical History:  Procedure Laterality Date  . ABDOMINAL HYSTERECTOMY    . APPENDECTOMY    . CHOLECYSTECTOMY    . TONSILLECTOMY    . TOTAL HIP ARTHROPLASTY Left 09/22/2017   Procedure: LEFT TOTAL HIP ARTHROPLASTY ANTERIOR APPROACH;  Surgeon: Kathryne Hitch, MD;  Location: [facility];  Service: Orthopedics;  Laterality: Left;   Social History   Occupational History  . Not on file  Tobacco Use  . Smoking status: Former Smoker    Packs/day: 1.00    Years: 15.00    Pack years: 15.00    Last  attempt to quit: 05/17/1995    Years since quitting: 22.9  . Smokeless tobacco: Never Used  Substance and Sexual Activity  . Alcohol use: Yes    Comment: occas  . Drug use: Never  . Sexual activity: Yes

## 2018-04-27 NOTE — Telephone Encounter (Signed)
Noted  

## 2018-05-03 ENCOUNTER — Other Ambulatory Visit: Payer: Self-pay | Admitting: Family Medicine

## 2018-05-03 DIAGNOSIS — Z1231 Encounter for screening mammogram for malignant neoplasm of breast: Secondary | ICD-10-CM

## 2018-05-18 ENCOUNTER — Telehealth (INDEPENDENT_AMBULATORY_CARE_PROVIDER_SITE_OTHER): Payer: Self-pay

## 2018-05-18 NOTE — Telephone Encounter (Signed)
Submitted VOB for SynviscOne, bilateral knee. 

## 2018-05-24 ENCOUNTER — Telehealth (INDEPENDENT_AMBULATORY_CARE_PROVIDER_SITE_OTHER): Payer: Self-pay

## 2018-05-24 ENCOUNTER — Ambulatory Visit (INDEPENDENT_AMBULATORY_CARE_PROVIDER_SITE_OTHER): Payer: BC Managed Care – PPO | Admitting: Orthopaedic Surgery

## 2018-05-24 NOTE — Telephone Encounter (Signed)
Talked with patient's insurance due to patient having an appointment today for gel injection.  Advised by Greig Castilla, Rep.at [employer] that patient would be covered at 100% after a $94.00 co-pay, [employer] & An[employer]te Stable and a PA is required. Reference# [id number]  Will call to initiate PA for SynviscOne, bilateral knee.

## 2018-05-25 ENCOUNTER — Telehealth (INDEPENDENT_AMBULATORY_CARE_PROVIDER_SITE_OTHER): Payer: Self-pay

## 2018-05-25 NOTE — Telephone Encounter (Signed)
Received VOB for SynviscOne, bilateral knee. Per VOB, Buy & Bill, Covered at 70% of the allowed amount, and PA required.  Faxed completed PA form to [employer] at [phone].

## 2018-05-28 ENCOUNTER — Telehealth (INDEPENDENT_AMBULATORY_CARE_PROVIDER_SITE_OTHER): Payer: Self-pay

## 2018-05-28 NOTE — Telephone Encounter (Signed)
Patient is approved for SynviscOne, bilateral knee. Buy & Bill Covered at 70% of the allowed amount, per VOB   Co-pay of $94.00 PA required PA Approval# [id number] Valid 05/25/2018- 05/25/2019  Appt. 05/30/2018 with Dr. Magnus Ivan

## 2018-05-30 ENCOUNTER — Ambulatory Visit (INDEPENDENT_AMBULATORY_CARE_PROVIDER_SITE_OTHER): Payer: BC Managed Care – PPO | Admitting: Orthopaedic Surgery

## 2018-05-30 ENCOUNTER — Encounter (INDEPENDENT_AMBULATORY_CARE_PROVIDER_SITE_OTHER): Payer: Self-pay | Admitting: Orthopaedic Surgery

## 2018-05-30 DIAGNOSIS — M1712 Unilateral primary osteoarthritis, left knee: Secondary | ICD-10-CM

## 2018-05-30 DIAGNOSIS — M1711 Unilateral primary osteoarthritis, right knee: Secondary | ICD-10-CM | POA: Insufficient documentation

## 2018-05-30 MED ORDER — HYLAN G-F 20 48 MG/6ML IX SOSY
48.0000 mg | PREFILLED_SYRINGE | INTRA_ARTICULAR | Status: AC | PRN
  Administered 2018-05-30: 48 mg via INTRA_ARTICULAR

## 2018-05-30 NOTE — Progress Notes (Signed)
   Procedure Note  Patient: Julie Mcguire             Date of Birth: December 04, 1955           MRN: [id number]             Visit Date: 05/30/2018  Procedures: Visit Diagnoses: Unilateral primary osteoarthritis, left knee  Unilateral primary osteoarthritis, right knee  Large Joint Inj: R knee on 05/30/2018 3:58 PM Indications: pain and diagnostic evaluation Details: 22 G 1.5 in needle, superolateral approach  Arthrogram: No  Medications: 48 mg Hylan 48 MG/6ML Outcome: tolerated well, no immediate complications Procedure, treatment alternatives, risks and benefits explained, specific risks discussed. Consent was given by the patient. Immediately prior to procedure a time out was called to verify the correct patient, procedure, equipment, support staff and site/side marked as required. Patient was prepped and draped in the usual sterile fashion.   Large Joint Inj: L knee on 05/30/2018 3:58 PM Indications: pain and diagnostic evaluation Details: 22 G 1.5 in needle, superolateral approach  Arthrogram: No  Medications: 48 mg Hylan 48 MG/6ML Outcome: tolerated well, no immediate complications Procedure, treatment alternatives, risks and benefits explained, specific risks discussed. Consent was given by the patient. Immediately prior to procedure a time out was called to verify the correct patient, procedure, equipment, support staff and site/side marked as required. Patient was prepped and draped in the usual sterile fashion.    The patient is here today for scheduled bilateral knee Synvisc 1 injections to treat the pain from osteoarthritis.  She is still working on quad strengthening exercises and weight loss.  She has daily knee pain.  Her arthritis is definitely affected her mobility, her activity living and her quality of life.  She knows this is the neck step to try for her.  All question concerns were answered and addressed.  Both knees are obese.  Both knees have no effusion but global  tenderness and pain and they move well.  She tolerated the Synvisc 1 injection in both knees easily.  Follow-up will be as needed.

## 2018-06-11 ENCOUNTER — Ambulatory Visit
Admission: RE | Admit: 2018-06-11 | Discharge: 2018-06-11 | Disposition: A | Discharge status: Home / Self Care | Payer: BC Managed Care – PPO | Source: Ambulatory Visit | Attending: Family Medicine | Admitting: Family Medicine

## 2018-06-11 DIAGNOSIS — Z1231 Encounter for screening mammogram for malignant neoplasm of breast: Secondary | ICD-10-CM

## 2018-06-13 ENCOUNTER — Telehealth (INDEPENDENT_AMBULATORY_CARE_PROVIDER_SITE_OTHER): Payer: Self-pay | Admitting: Orthopaedic Surgery

## 2018-06-13 NOTE — Telephone Encounter (Signed)
Pt called stating she got the gel shot a few weeks ago and is wondering if the inj is in a series of 3 and when she is suppose to come back.

## 2018-06-13 NOTE — Telephone Encounter (Signed)
Patient aware this was just a single injection

## 2019-02-01 IMAGING — MG DIGITAL SCREENING BILATERAL MAMMOGRAM WITH CAD
4 series · 4 of 4 positions shown · non-contrast
Comparison: Previous exam(s).

CLINICAL DATA: Screening.

EXAM:
DIGITAL SCREENING BILATERAL MAMMOGRAM WITH CAD

[L MLO]
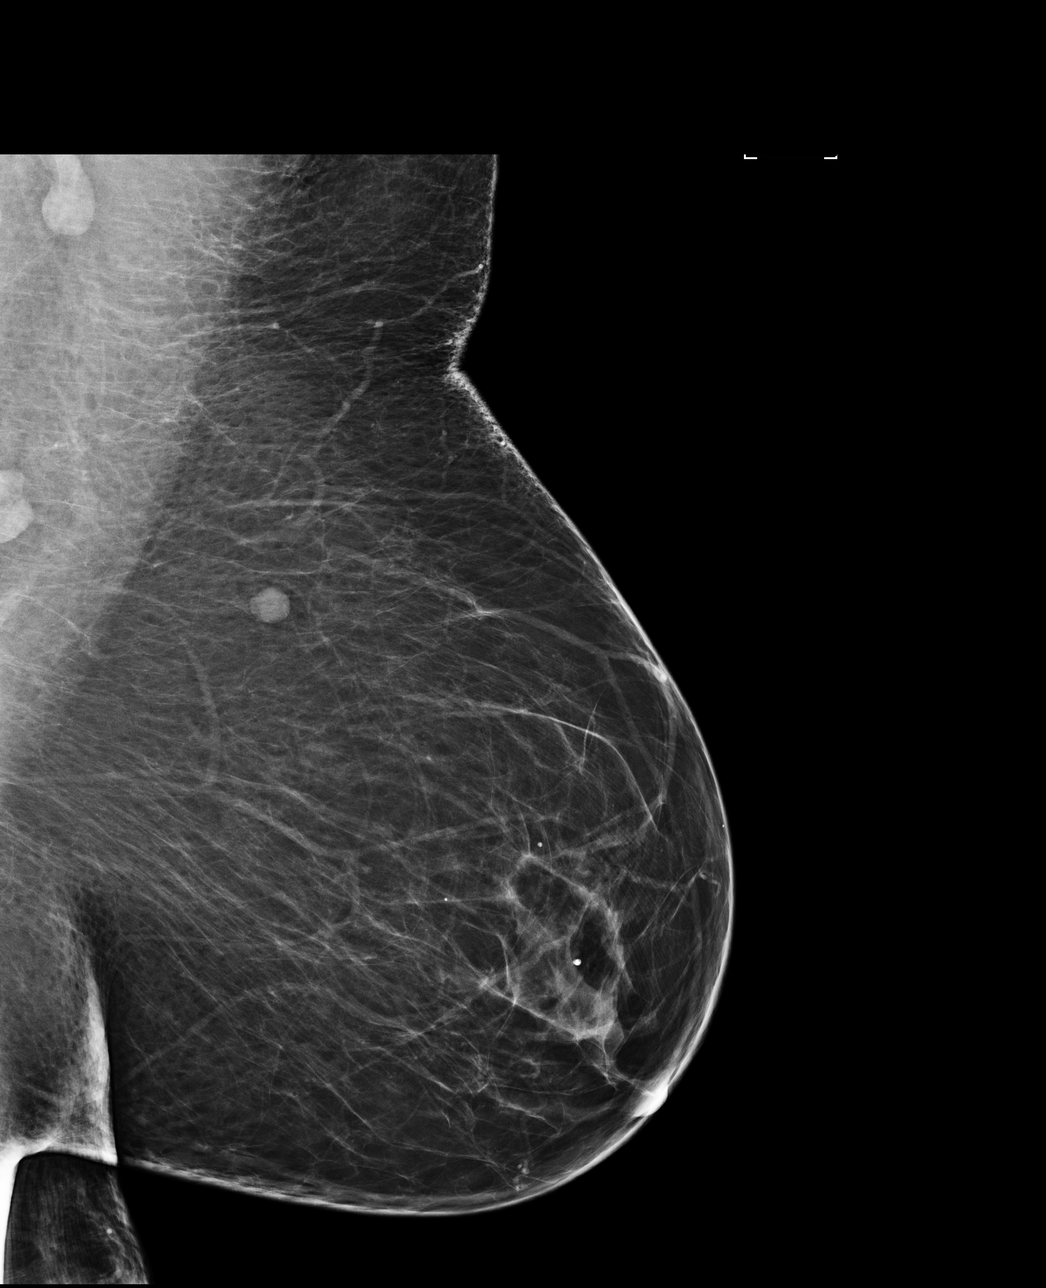

[L CC]
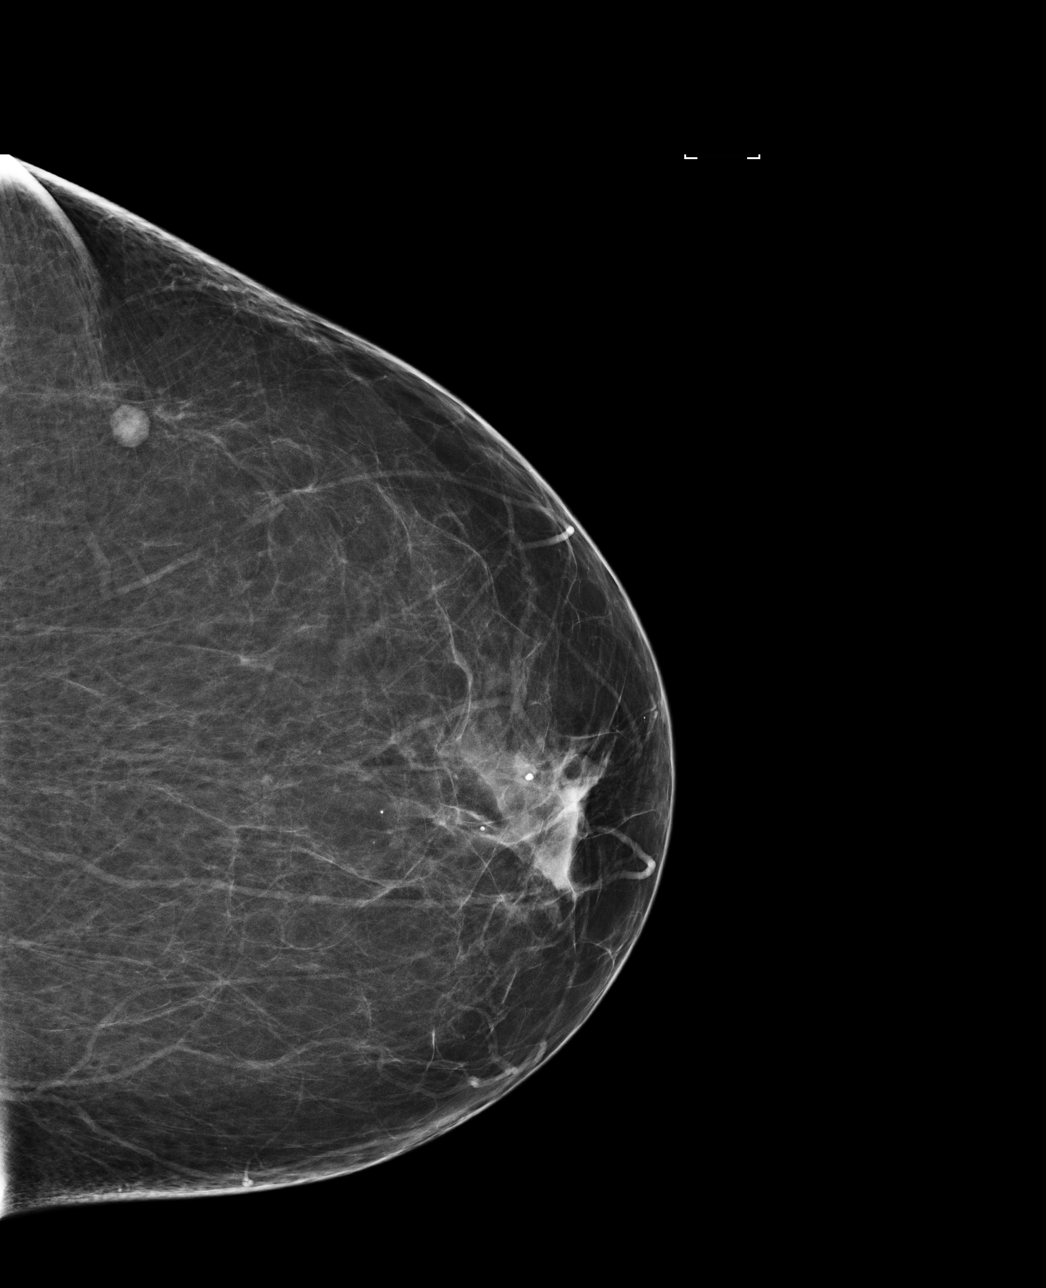

[R MLO]
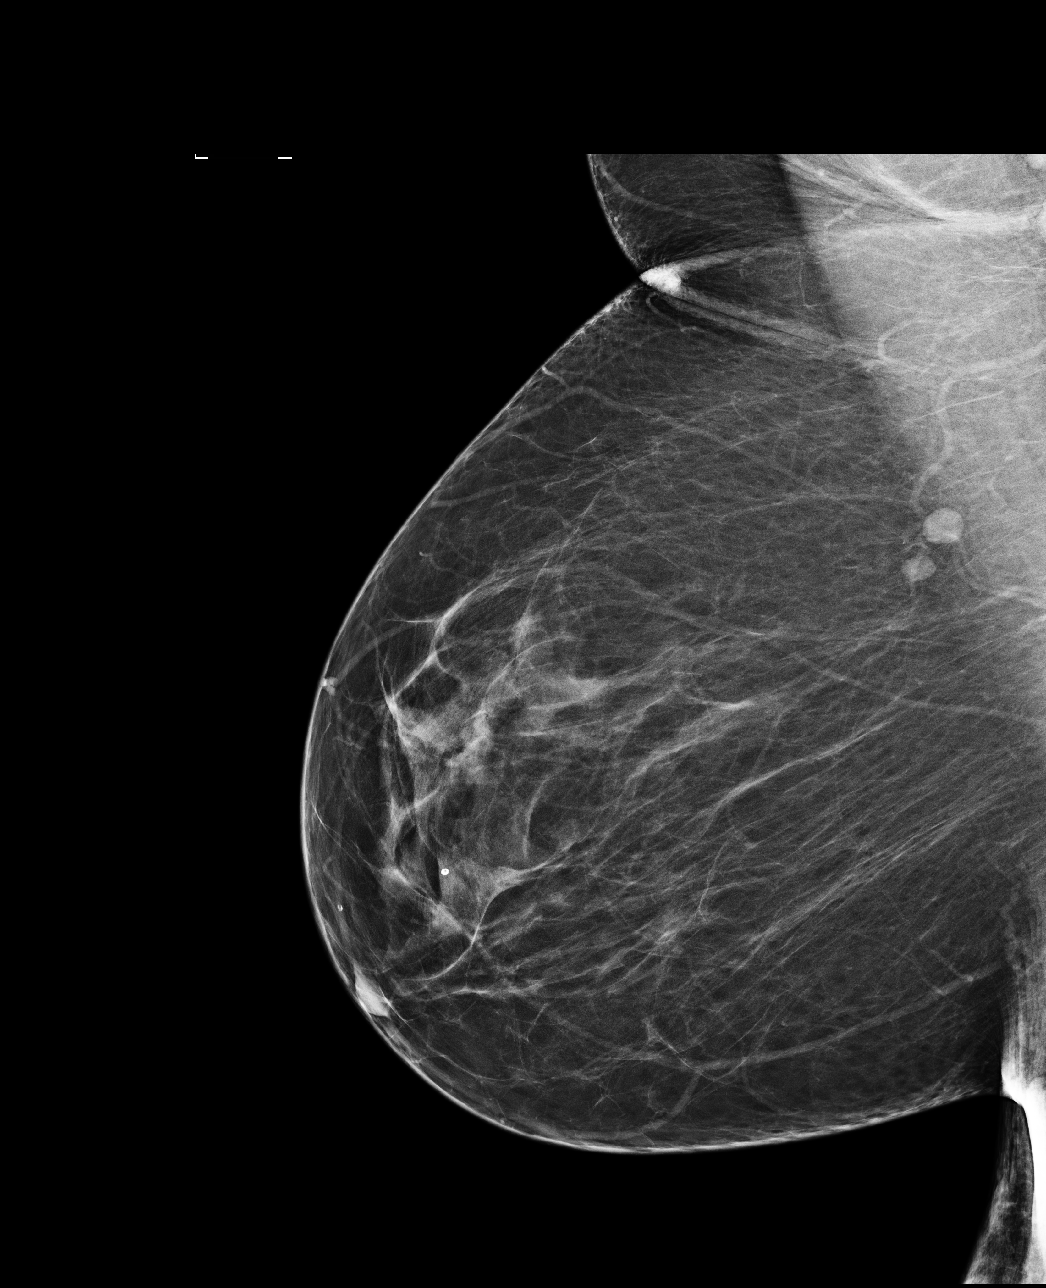

[R CC]
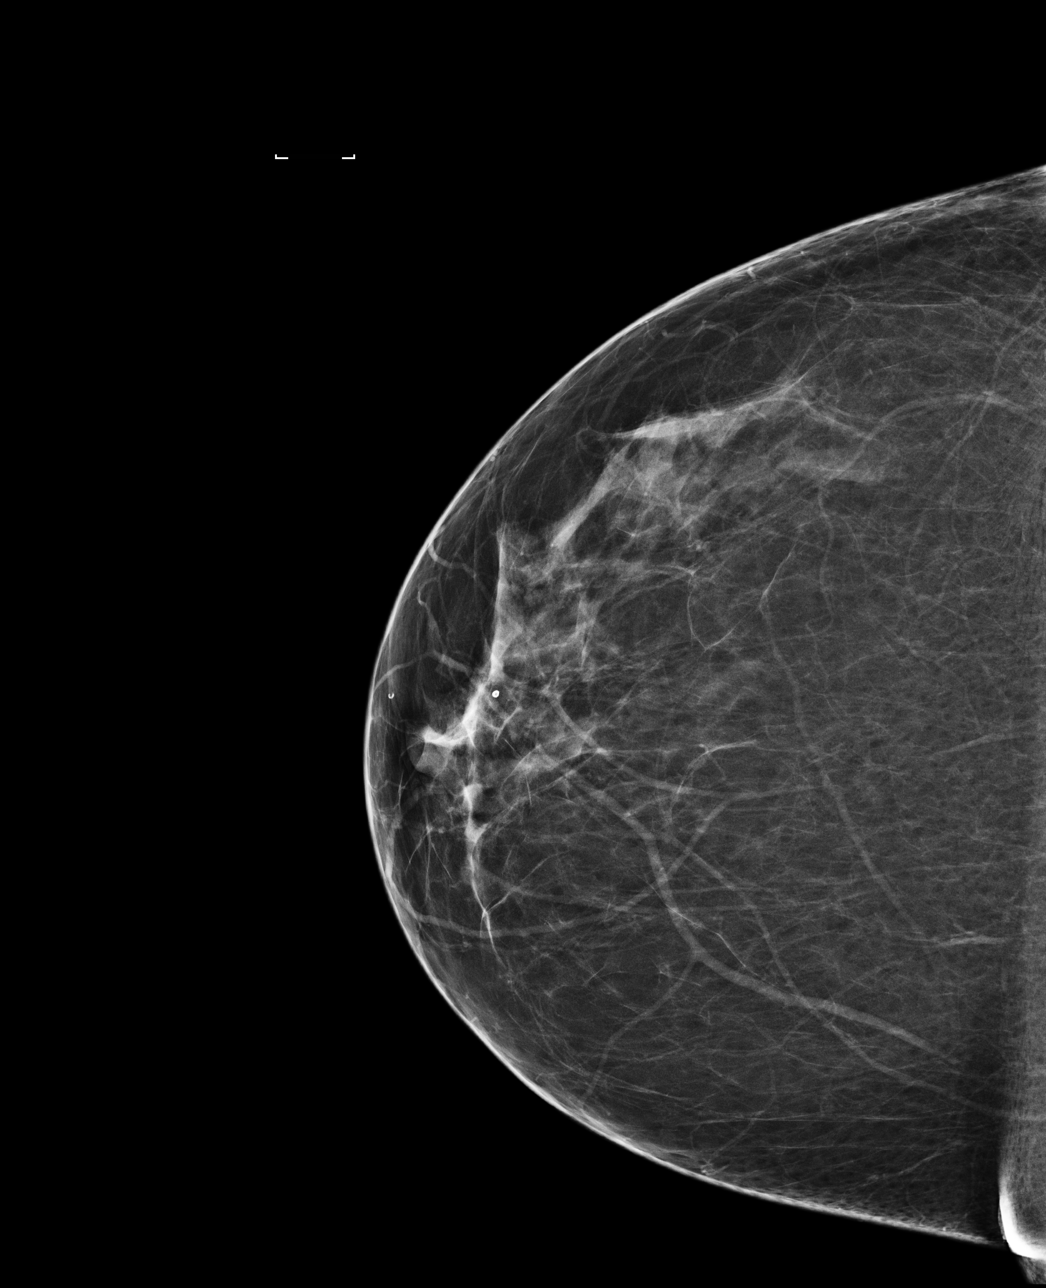

[4 of 4 positions shown; findings below may reference images not displayed]

ACR Breast Density Category b: There are scattered areas of
fibroglandular density.
FINDINGS: There are no findings suspicious for malignancy. Images were
processed with CAD.
IMPRESSION: No mammographic evidence of malignancy. A result letter of this
screening mammogram will be mailed directly to the patient.

RECOMMENDATION:
Screening mammogram in one year. (Code:AS-G-LCT)

BI-RADS CATEGORY  1: Negative.

## 2019-05-28 ENCOUNTER — Other Ambulatory Visit: Payer: Self-pay | Admitting: Internal Medicine

## 2019-05-28 DIAGNOSIS — Z1231 Encounter for screening mammogram for malignant neoplasm of breast: Secondary | ICD-10-CM

## 2019-07-08 ENCOUNTER — Other Ambulatory Visit: Payer: Self-pay

## 2019-07-08 ENCOUNTER — Ambulatory Visit
Admission: RE | Admit: 2019-07-08 | Discharge: 2019-07-08 | Disposition: A | Discharge status: Home / Self Care | Payer: BC Managed Care – PPO | Source: Ambulatory Visit | Attending: Internal Medicine | Admitting: Internal Medicine

## 2019-07-08 ENCOUNTER — Other Ambulatory Visit: Payer: Self-pay | Admitting: Family Medicine

## 2019-07-08 DIAGNOSIS — Z1231 Encounter for screening mammogram for malignant neoplasm of breast: Secondary | ICD-10-CM

## 2020-01-07 ENCOUNTER — Encounter (HOSPITAL_COMMUNITY): Payer: Self-pay

## 2020-01-07 ENCOUNTER — Inpatient Hospital Stay (HOSPITAL_COMMUNITY)
Admission: EM | Admit: 2020-01-07 | Discharge: 2020-01-10 | DRG: 177 | Disposition: A | Discharge status: Home / Self Care | Payer: BC Managed Care – PPO | Attending: Internal Medicine | Admitting: Internal Medicine

## 2020-01-07 ENCOUNTER — Other Ambulatory Visit: Payer: Self-pay

## 2020-01-07 ENCOUNTER — Emergency Department (HOSPITAL_COMMUNITY): Payer: BC Managed Care – PPO

## 2020-01-07 DIAGNOSIS — Z6841 Body Mass Index (BMI) 40.0 and over, adult: Secondary | ICD-10-CM | POA: Diagnosis not present

## 2020-01-07 DIAGNOSIS — J9621 Acute and chronic respiratory failure with hypoxia: Secondary | ICD-10-CM | POA: Diagnosis present

## 2020-01-07 DIAGNOSIS — N179 Acute kidney failure, unspecified: Secondary | ICD-10-CM | POA: Diagnosis present

## 2020-01-07 DIAGNOSIS — J9600 Acute respiratory failure, unspecified whether with hypoxia or hypercapnia: Secondary | ICD-10-CM | POA: Diagnosis present

## 2020-01-07 DIAGNOSIS — E871 Hypo-osmolality and hyponatremia: Secondary | ICD-10-CM | POA: Diagnosis present

## 2020-01-07 DIAGNOSIS — Z79899 Other long term (current) drug therapy: Secondary | ICD-10-CM

## 2020-01-07 DIAGNOSIS — U071 COVID-19: Secondary | ICD-10-CM | POA: Diagnosis present

## 2020-01-07 DIAGNOSIS — J9601 Acute respiratory failure with hypoxia: Secondary | ICD-10-CM | POA: Diagnosis present

## 2020-01-07 DIAGNOSIS — I10 Essential (primary) hypertension: Secondary | ICD-10-CM

## 2020-01-07 DIAGNOSIS — Z96642 Presence of left artificial hip joint: Secondary | ICD-10-CM | POA: Diagnosis present

## 2020-01-07 DIAGNOSIS — J1282 Pneumonia due to coronavirus disease 2019: Secondary | ICD-10-CM | POA: Diagnosis present

## 2020-01-07 DIAGNOSIS — E876 Hypokalemia: Secondary | ICD-10-CM | POA: Diagnosis present

## 2020-01-07 DIAGNOSIS — K922 Gastrointestinal hemorrhage, unspecified: Secondary | ICD-10-CM | POA: Diagnosis not present

## 2020-01-07 DIAGNOSIS — E66813 Obesity, class 3: Secondary | ICD-10-CM | POA: Diagnosis present

## 2020-01-07 LAB — LACTIC ACID, PLASMA
Lactic Acid, Venous: 1.3 mmol/L (ref 0.5–1.9)
Lactic Acid, Venous: 1 mmol/L (ref 0.5–1.9)

## 2020-01-07 LAB — CBC WITH DIFFERENTIAL/PLATELET
Abs Immature Granulocytes: 0.04 10*3/uL (ref 0.00–0.07)
Basophils Absolute: 0 10*3/uL (ref 0.0–0.1)
Basophils Relative: 0 %
Eosinophils Absolute: 0 10*3/uL (ref 0.0–0.5)
Eosinophils Relative: 0 %
HCT: 41.1 % (ref 36.0–46.0)
Hemoglobin: 13.6 g/dL (ref 12.0–15.0)
Immature Granulocytes: 1 %
Lymphocytes Relative: 9 %
Lymphs Abs: 0.6 10*3/uL — ABNORMAL LOW (ref 0.7–4.0)
MCH: 30.4 pg (ref 26.0–34.0)
MCHC: 33.1 g/dL (ref 30.0–36.0)
MCV: 91.7 fL (ref 80.0–100.0)
Monocytes Absolute: 0.4 10*3/uL (ref 0.1–1.0)
Monocytes Relative: 6 %
Neutro Abs: 5.5 10*3/uL (ref 1.7–7.7)
Neutrophils Relative %: 84 %
Platelets: 153 10*3/uL (ref 150–400)
RBC: 4.48 MIL/uL (ref 3.87–5.11)
RDW: 14.9 % (ref 11.5–15.5)
WBC: 6.5 10*3/uL (ref 4.0–10.5)
nRBC: 0 % (ref 0.0–0.2)

## 2020-01-07 LAB — COMPREHENSIVE METABOLIC PANEL
ALT: 25 U/L (ref 0–44)
AST: 47 U/L — ABNORMAL HIGH (ref 15–41)
Albumin: 3.6 g/dL (ref 3.5–5.0)
Alkaline Phosphatase: 64 U/L (ref 38–126)
Anion gap: 13 (ref 5–15)
BUN: 35 mg/dL — ABNORMAL HIGH (ref 8–23)
CO2: 22 mmol/L (ref 22–32)
Calcium: 8.1 mg/dL — ABNORMAL LOW (ref 8.9–10.3)
Chloride: 99 mmol/L (ref 98–111)
Creatinine, Ser: 1.55 mg/dL — ABNORMAL HIGH (ref 0.44–1.00)
GFR calc Af Amer: 41 mL/min — ABNORMAL LOW (ref >60)
GFR calc non Af Amer: 36 mL/min — ABNORMAL LOW (ref >60)
Glucose, Bld: 151 mg/dL — ABNORMAL HIGH (ref 70–99)
Potassium: 3.4 mmol/L — ABNORMAL LOW (ref 3.5–5.1)
Sodium: 134 mmol/L — ABNORMAL LOW (ref 135–145)
Total Bilirubin: 0.6 mg/dL (ref 0.3–1.2)
Total Protein: 7 g/dL (ref 6.5–8.1)

## 2020-01-07 LAB — TRIGLYCERIDES: Triglycerides: 166 mg/dL — ABNORMAL HIGH (ref <150)

## 2020-01-07 LAB — FIBRINOGEN: Fibrinogen: 379 mg/dL (ref 210–475)

## 2020-01-07 LAB — PROCALCITONIN: Procalcitonin: 0.23 ng/mL

## 2020-01-07 LAB — C-REACTIVE PROTEIN: CRP: 8.3 mg/dL — ABNORMAL HIGH (ref <1.0)

## 2020-01-07 LAB — SARS CORONAVIRUS 2 BY RT PCR (HOSPITAL ORDER, PERFORMED IN ~~LOC~~ HOSPITAL LAB): SARS Coronavirus 2: POSITIVE — AB

## 2020-01-07 LAB — POC OCCULT BLOOD, ED: Fecal Occult Bld: POSITIVE — AB

## 2020-01-07 LAB — FERRITIN: Ferritin: 726 ng/mL — ABNORMAL HIGH (ref 11–307)

## 2020-01-07 LAB — LACTATE DEHYDROGENASE: LDH: 335 U/L — ABNORMAL HIGH (ref 98–192)

## 2020-01-07 LAB — D-DIMER, QUANTITATIVE: D-Dimer, Quant: 2.36 ug/mL-FEU — ABNORMAL HIGH (ref 0.00–0.50)

## 2020-01-07 MED ORDER — ZINC SULFATE 220 (50 ZN) MG PO CAPS
220.0000 mg | ORAL_CAPSULE | Freq: Every day | ORAL | Status: DC
  Administered 2020-01-07 – 2020-01-10 (×4): 220 mg via ORAL
  Filled 2020-01-07 (×4): qty 1

## 2020-01-07 MED ORDER — BARICITINIB 2 MG PO TABS
2.0000 mg | ORAL_TABLET | Freq: Every day | ORAL | Status: DC
  Administered 2020-01-07 – 2020-01-10 (×4): 2 mg via ORAL
  Filled 2020-01-07 (×4): qty 1

## 2020-01-07 MED ORDER — ONDANSETRON HCL 4 MG PO TABS: 4.0000 mg | ORAL_TABLET | Freq: Four times a day (QID) | ORAL | Status: DC | PRN

## 2020-01-07 MED ORDER — LACTATED RINGERS IV BOLUS
1000.0000 mL | Freq: Once | INTRAVENOUS | Status: AC
  Administered 2020-01-07: 1000 mL via INTRAVENOUS

## 2020-01-07 MED ORDER — METHYLPREDNISOLONE SODIUM SUCC 125 MG IJ SOLR
60.0000 mg | Freq: Two times a day (BID) | INTRAMUSCULAR | Status: DC
  Administered 2020-01-07 – 2020-01-10 (×6): 60 mg via INTRAVENOUS
  Filled 2020-01-07 (×7): qty 2

## 2020-01-07 MED ORDER — OXYBUTYNIN CHLORIDE 5 MG PO TABS
5.0000 mg | ORAL_TABLET | Freq: Two times a day (BID) | ORAL | Status: DC
  Administered 2020-01-07 – 2020-01-10 (×6): 5 mg via ORAL
  Filled 2020-01-07 (×6): qty 1

## 2020-01-07 MED ORDER — SODIUM CHLORIDE 0.9 % IV SOLN: INTRAVENOUS | Status: AC

## 2020-01-07 MED ORDER — SODIUM CHLORIDE 0.9 % IV SOLN
200.0000 mg | Freq: Once | INTRAVENOUS | Status: AC
  Administered 2020-01-07: 200 mg via INTRAVENOUS
  Filled 2020-01-07: qty 200

## 2020-01-07 MED ORDER — ASCORBIC ACID 500 MG PO TABS
500.0000 mg | ORAL_TABLET | Freq: Every day | ORAL | Status: DC
  Administered 2020-01-07 – 2020-01-10 (×4): 500 mg via ORAL
  Filled 2020-01-07 (×4): qty 1

## 2020-01-07 MED ORDER — METHYLPREDNISOLONE SODIUM SUCC 125 MG IJ SOLR: 60.0000 mg | Freq: Two times a day (BID) | INTRAMUSCULAR | Status: DC

## 2020-01-07 MED ORDER — SODIUM CHLORIDE 0.9 % IV SOLN
100.0000 mg | Freq: Every day | INTRAVENOUS | Status: DC
  Administered 2020-01-08 – 2020-01-10 (×3): 100 mg via INTRAVENOUS
  Filled 2020-01-07 (×3): qty 20

## 2020-01-07 MED ORDER — FAMOTIDINE 20 MG PO TABS
20.0000 mg | ORAL_TABLET | Freq: Every day | ORAL | Status: DC
  Administered 2020-01-07 – 2020-01-10 (×4): 20 mg via ORAL
  Filled 2020-01-07 (×4): qty 1

## 2020-01-07 MED ORDER — HEPARIN SODIUM (PORCINE) 5000 UNIT/ML IJ SOLN: 5000.0000 [IU] | Freq: Three times a day (TID) | INTRAMUSCULAR | Status: DC

## 2020-01-07 MED ORDER — POTASSIUM CHLORIDE CRYS ER 20 MEQ PO TBCR
20.0000 meq | EXTENDED_RELEASE_TABLET | Freq: Once | ORAL | Status: AC
  Administered 2020-01-07: 20 meq via ORAL
  Filled 2020-01-07: qty 1

## 2020-01-07 MED ORDER — ACETAMINOPHEN 325 MG PO TABS
650.0000 mg | ORAL_TABLET | Freq: Four times a day (QID) | ORAL | Status: DC | PRN
  Administered 2020-01-07 – 2020-01-09 (×3): 650 mg via ORAL
  Filled 2020-01-07 (×2): qty 2

## 2020-01-07 MED ORDER — GUAIFENESIN-DM 100-10 MG/5ML PO SYRP
10.0000 mL | ORAL_SOLUTION | ORAL | Status: DC | PRN
  Administered 2020-01-08 – 2020-01-09 (×2): 10 mL via ORAL
  Filled 2020-01-07 (×2): qty 10

## 2020-01-07 MED ORDER — ONDANSETRON HCL 4 MG/2ML IJ SOLN: 4.0000 mg | Freq: Four times a day (QID) | INTRAMUSCULAR | Status: DC | PRN

## 2020-01-07 MED ORDER — PANTOPRAZOLE SODIUM 40 MG IV SOLR
40.0000 mg | Freq: Every day | INTRAVENOUS | Status: DC
  Administered 2020-01-07 – 2020-01-09 (×3): 40 mg via INTRAVENOUS
  Filled 2020-01-07 (×3): qty 40

## 2020-01-07 NOTE — ED Triage Notes (Signed)
Pt arrives EMS from home with complaints of SHOB, cough, fever. Pt reports a positive COVID test on Sunday. Pt reports she began having increased SHOB last 2 days. Per EMS: Pt was 84% on RA upon arrival. Pt placed on 3L of O2 via [place]- Sats up to 94% on 3L. EMS administered 1000mg  Tylenol PO.

## 2020-01-07 NOTE — ED Provider Notes (Signed)
[facility]-EMERGENCY DEPT Provider Note   CSN: [id number] Arrival date & time: 01/07/20  1359     History Chief Complaint  Patient presents with  . COVID Positive  . Shortness of Breath    Julie Mcguire is a 64 y.o. female.  64 year old female w/ history of hypertension who presents with COVID-19 and shortness of breath.  Patient began feeling ill approximately 3 days ago with cough, mild body aches, headache, fevers, and shortness of breath which has progressively worsened.  She tested positive for COVID-19 2 days ago.  She reports several sick family members.  She denies any chest pain, vomiting, diarrhea, or abdominal pain.  She has been taking Tylenol for her symptoms.  EMS gave Tylenol just prior to arrival.  No history of blood clots.  She is not vaccinated against COVID-19.  The history is provided by the patient.  Shortness of Breath      Past Medical History:  Diagnosis Date  . Hypertension     Patient Active Problem List   Diagnosis Date Noted  . Unilateral primary osteoarthritis, left knee 05/30/2018  . Unilateral primary osteoarthritis, right knee 05/30/2018  . 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 Unilateral primary osteoarthritis, left hip 08/30/2017    Past Surgical History:  Procedure Laterality Date  . ABDOMINAL HYSTERECTOMY    . APPENDECTOMY    . CHOLECYSTECTOMY    . TONSILLECTOMY    . TOTAL HIP ARTHROPLASTY Left 09/22/2017   Procedure: LEFT TOTAL HIP ARTHROPLASTY ANTERIOR APPROACH;  Surgeon: Kathryne Hitch, MD;  Location: [facility];  Service: Orthopedics;  Laterality: Left;     OB History   No obstetric history on file.     History reviewed. No pertinent family history.  Social History   Tobacco Use  . Smoking status: Former Smoker    Packs/day: 1.00    Years: 15.00    Pack years: 15.00    Quit date: 05/17/1995    Years since quitting: 24.6  . Smokeless tobacco: Never Used  Vaping Use  .  Vaping Use: Never used  Substance Use Topics  . Alcohol use: Yes    Comment: occas  . Drug use: Never    Home Medications Prior to Admission medications   Medication Sig Start Date End Date Taking? Authorizing Provider  acetaminophen (TYLENOL) 500 MG tablet Take 1,000 mg by mouth 3 (three) times daily. With tramadol for hip pain.    [provider]  amLODipine (NORVASC) 5 MG tablet Take 5 mg by mouth daily. 06/11/17   [provider]  aspirin 81 MG chewable tablet Chew 1 tablet (81 mg total) by mouth 2 (two) times daily. 09/23/17   Kathryne Hitch, MD  methocarbamol (ROBAXIN) 500 MG tablet Take 1 tablet (500 mg total) by mouth every 6 (six) hours as needed for muscle spasms. 09/23/17   Kathryne Hitch, MD  metoprolol succinate (TOPROL-XL) 100 MG 24 hr tablet Take 100 mg by mouth daily. 08/17/17   [provider]  oxyCODONE (OXY IR/ROXICODONE) 5 MG immediate release tablet Take 1-2 tablets (5-10 mg total) by mouth every 4 (four) hours as needed for moderate pain (pain score 4-6). 09/23/17   Kathryne Hitch, MD  sulfamethoxazole-trimethoprim (BACTRIM DS,SEPTRA DS) 800-160 MG tablet Take 1 tablet by mouth 2 (two) times daily. 09/23/17   Kathryne Hitch, MD    Allergies    Codeine  Review of Systems   Review of Systems  Respiratory:  Positive for shortness of breath.    All other systems reviewed and are negative except that which was mentioned in HPI  Physical Exam Updated Vital Signs BP 110/66   Pulse (!) 120   Temp (!) 102.8 F (39.3 C) (Rectal)   Resp (!) 23   Ht 5\' 4"  (1.626 m)   Wt 117.9 kg   SpO2 93%   BMI 44.63 kg/m   Physical Exam Vitals and nursing note reviewed.  Constitutional:      General: She is not in acute distress.    Appearance: She is well-developed. She is ill-appearing.  HENT:     Head: Normocephalic and atraumatic.  Eyes:     Conjunctiva/sclera: Conjunctivae normal.  Cardiovascular:     Rate and  Rhythm: Regular rhythm. Tachycardia present.     Heart sounds: Normal heart sounds. No murmur heard.   Pulmonary:     Comments: Mild tachypnea, no wheezing or rales Abdominal:     General: Bowel sounds are normal. There is no distension.     Palpations: Abdomen is soft.     Tenderness: There is no abdominal tenderness.  Musculoskeletal:     Cervical back: Neck supple.     Right lower leg: No tenderness. No edema.     Left lower leg: No tenderness. No edema.  Skin:    General: Skin is warm and dry.  Neurological:     Mental Status: She is alert and oriented to person, place, and time.     Comments: Fluent speech  Psychiatric:        Mood and Affect: Mood normal.        Judgment: Judgment normal.     ED Results / Procedures / Treatments   Labs (all labs ordered are listed, but only abnormal results are displayed) Labs Reviewed  CBC WITH DIFFERENTIAL/PLATELET - Abnormal; Notable for the following components:      Result Value   Lymphs Abs 0.6 (*)    All other components within normal limits  COMPREHENSIVE METABOLIC PANEL - Abnormal; Notable for the following components:   Sodium 134 (*)    Potassium 3.4 (*)    Glucose, Bld 151 (*)    BUN 35 (*)    Creatinine, Ser 1.55 (*)    Calcium 8.1 (*)    AST 47 (*)    GFR calc non Af Amer 36 (*)    GFR calc Af Amer 41 (*)    All other components within normal limits  D-DIMER, QUANTITATIVE (NOT AT [facility]) - Abnormal; Notable for the following components:   D-Dimer, Quant 2.36 (*)    All other components within normal limits  LACTATE DEHYDROGENASE - Abnormal; Notable for the following components:   LDH 335 (*)    All other components within normal limits  FERRITIN - Abnormal; Notable for the following components:   Ferritin 726 (*)    All other components within normal limits  TRIGLYCERIDES - Abnormal; Notable for the following components:   Triglycerides 166 (*)    All other components within normal limits  C-REACTIVE PROTEIN -  Abnormal; Notable for the following components:   CRP 8.3 (*)    All other components within normal limits  SARS CORONAVIRUS 2 BY RT PCR (HOSPITAL ORDER, PERFORMED IN  HOSPITAL LAB)  CULTURE, BLOOD (ROUTINE X 2)  CULTURE, BLOOD (ROUTINE X 2)  LACTIC ACID, PLASMA  PROCALCITONIN  FIBRINOGEN  LACTIC ACID, PLASMA    EKG EKG Interpretation  Date/Time:  Tuesday January 07 2020 14:16:19 EDT Ventricular Rate:  129 PR Interval:    QRS Duration: 81 QT Interval:  287 QTC Calculation: 421 R Axis:   -56 Text Interpretation: Sinus tachycardia Abnormal R-wave progression, late transition Inferior infarct, old tachycardia new from previous Confirmed by Frederick Peers [phone]) on 01/07/2020 3:14:34 PM   Radiology No results found.  Procedures Procedures (including critical care time)  Medications Ordered in ED Medications  lactated ringers bolus 1,000 mL (has no administration in time range)    ED Course  I have reviewed the triage vital signs and the nursing notes.  Pertinent labs & imaging results that were available during my care of the patient were reviewed by me and considered in my medical decision making (see chart for details).    MDM Rules/Calculators/A&P                          Patient was febrile and tachycardic, no respiratory distress.  O2 saturation in the mid 90s on 3L [place].  No pleuritic chest pain to suggest PE.  Lab work shows mild AKI with creatinine 1.55, AST 47, normal CBC, normal lactate, elevated inflammatory markers consistent with known Covid infection.  Gave IV fluid bolus, had already received Tylenol from EMS.  Chest x-ray is pending but I suspect hypoxia is related to pulmonary changes from COVID-19.  Discussed admission for hypoxic respiratory failure with [facility] hospitalist, Dr. Rito Ehrlich.   Julie Mcguire was evaluated in Emergency Department on 01/07/2020 for the symptoms described in the history of present illness. She was evaluated in the context  of the global COVID-19 pandemic, which necessitated consideration that the patient might be at risk for infection with the SARS-CoV-2 virus that causes COVID-19. Institutional protocols and algorithms that pertain to the evaluation of patients at risk for COVID-19 are in a state of rapid change based on information released by regulatory bodies including the CDC and federal and state organizations. These policies and algorithms were followed during the patient's care in the ED.  Final Clinical Impression(s) / ED Diagnoses Final diagnoses:  Acute hypoxemic respiratory failure due to COVID-19 [facility])  AKI (acute kidney injury) [facility])    Rx / DC Orders ED Discharge Orders    None       Faaris Arizpe, Ambrose Finland, MD 01/07/20 1615

## 2020-01-07 NOTE — ED Notes (Signed)
Pt recently had a dark maroon stool with a positive hemocult. This RN asked provider if IV protonix and an H&H/T&S would be appropriate. Awaiting order at this time.

## 2020-01-07 NOTE — ED Notes (Signed)
Provider aware of large amount of mucous/coagulted stool and that pt BP have also been low.

## 2020-01-07 NOTE — H&P (Signed)
[facility] History and Physical  Julie Mcguire OMV:[id number] DOB: April 06, 1956 DOA: 01/07/2020   PCP: Elias Else, MD  Specialists: None  Chief Complaint: Shortness of breath  HPI: STEPHENY Mcguire is a 64 y.o. female with a past medical history of hypertension who was exposed to her daughter who was sick with Covid last week.  Patient's symptoms started on Saturday consisting of body aches fatigue headache.  Denies any GI symptoms such as nausea vomiting or diarrhea.  Subsequently she developed a cough and also had shortness of breath.  Initially with exertion subsequently at rest.  She decided to come to the hospital for evaluation.  She has not been vaccinated against COVID-19.  Denies any chest pain.  Had cough with clear expectoration.  In the emergency department patient was found to have oxygen saturation in the 80s.  She was placed on oxygen.  Evaluation revealed pneumonia.  Inflammatory markers were elevated.  Patient was hospitalized for further management.  Home Medications: Prior to Admission medications   Medication Sig Start Date End Date Taking? Authorizing Provider  acetaminophen (TYLENOL) 500 MG tablet Take 1,000 mg by mouth as needed for moderate pain or headache.    Yes [provider]  amLODipine (NORVASC) 10 MG tablet Take 10 mg by mouth daily. 01/07/20  Yes [provider]  diclofenac (VOLTAREN) 75 MG EC tablet Take 75 mg by mouth 2 (two) times daily. 01/06/20  Yes [provider]  metoprolol succinate (TOPROL-XL) 100 MG 24 hr tablet Take 100 mg by mouth daily. 08/17/17  Yes [provider]  oxybutynin (DITROPAN) 5 MG tablet Take 5 mg by mouth 2 (two) times daily. 01/06/20  Yes [provider]  valsartan-hydrochlorothiazide (DIOVAN-HCT) 320-25 MG tablet Take 1 tablet by mouth daily. 01/07/20  Yes [provider]  aspirin 81 MG chewable tablet Chew 1 tablet (81 mg total) by mouth 2 (two) times daily. Patient not  taking: Reported on 01/07/2020 09/23/17   Kathryne Hitch, MD  methocarbamol (ROBAXIN) 500 MG tablet Take 1 tablet (500 mg total) by mouth every 6 (six) hours as needed for muscle spasms. Patient not taking: Reported on 01/07/2020 09/23/17   Kathryne Hitch, MD  oxyCODONE (OXY IR/ROXICODONE) 5 MG immediate release tablet Take 1-2 tablets (5-10 mg total) by mouth every 4 (four) hours as needed for moderate pain (pain score 4-6). Patient not taking: Reported on 01/07/2020 09/23/17   Kathryne Hitch, MD    Allergies:  Allergies  Allergen Reactions  . Codeine Nausea And Vomiting    Past Medical History: Past Medical History:  Diagnosis Date  . Hypertension     Past Surgical History:  Procedure Laterality Date  . ABDOMINAL HYSTERECTOMY    . APPENDECTOMY    . CHOLECYSTECTOMY    . TONSILLECTOMY    . TOTAL HIP ARTHROPLASTY Left 09/22/2017   Procedure: LEFT TOTAL HIP ARTHROPLASTY ANTERIOR APPROACH;  Surgeon: Kathryne Hitch, MD;  Location: [facility];  Service: Orthopedics;  Laterality: Left;    Social History: Lives with her husband.  Denies any history of smoking alcohol use or illicit drug use.  Usually independent with daily activities.  Family History: There is history of high blood pressure in the family.  Review of Systems - History obtained from the patient General ROS: positive for  - fatigue Psychological ROS: negative Ophthalmic ROS: negative ENT ROS: negative Allergy and Immunology ROS: negative Hematological and Lymphatic ROS: negative Endocrine ROS: negative Respiratory ROS: As in HPI Cardiovascular ROS:  As in HPI Gastrointestinal ROS: no abdominal pain, change in bowel habits, or black or bloody stools Genito-Urinary ROS: no dysuria, trouble voiding, or hematuria Musculoskeletal ROS: negative Neurological ROS: no TIA or stroke symptoms Dermatological ROS: negative  Physical Examination  Vitals:   01/07/20 1545 01/07/20 1600 01/07/20 1630  01/07/20 1645  BP: 119/70 110/66 (!) 96/59 (!) 99/58  Pulse: (!) 117 (!) 120 (!) 112 (!) 114  Resp: (!) 22 (!) 23 (!) 24 (!) 23  Temp:   99.1 F (37.3 C)   TempSrc:   Oral   SpO2: 93% 93% 90% 93%  Weight:      Height:        BP (!) 99/58   Pulse (!) 114   Temp 99.1 F (37.3 C) (Oral)   Resp (!) 23   Ht 5\' 4"  (1.626 m)   Wt 117.9 kg   SpO2 93%   BMI 44.63 kg/m   General appearance: alert, cooperative, appears stated age, no distress and morbidly obese Head: Normocephalic, without obvious abnormality, atraumatic Eyes: conjunctivae/corneas clear. PERRL, EOM's intact.  Throat: Dry mucous membranes Neck: no adenopathy, no carotid bruit, no JVD, supple, symmetrical, trachea midline and thyroid not enlarged, symmetric, no tenderness/mass/nodules Resp: Tachypneic at rest.  No use of accessory muscles.  Crackles bilaterally left more than right.  No wheezing or rhonchi. Cardio: S1-S2 is tachycardic regular.  No S3-S4.  No rubs murmurs or bruit. GI: soft, non-tender; bowel sounds normal; no masses,  no organomegaly Extremities: Venous stasis dermatitis noted both lower extremities.  Mild edema. Pulses: 2+ and symmetric Skin: Skin color, texture, turgor normal. No rashes or lesions Lymph nodes: Cervical, supraclavicular, and axillary nodes normal. Neurologic: Alert and oriented x3.  Cranial nerves II through XII intact.  Motor strength equal bilateral upper and lower extremities.   Labs on Admission: I have personally reviewed following labs and imaging studies  CBC: Recent Labs  Lab 01/07/20 1427  WBC 6.5  NEUTROABS 5.5  HGB 13.6  HCT 41.1  MCV 91.7  PLT 153   Basic Metabolic Panel: Recent Labs  Lab 01/07/20 1427  NA 134*  K 3.4*  CL 99  CO2 22  GLUCOSE 151*  BUN 35*  CREATININE 1.55*  CALCIUM 8.1*   GFR: Estimated Creatinine Clearance: 48.1 mL/min (A) (by C-G formula based on SCr of 1.55 mg/dL (H)). Liver Function Tests: Recent Labs  Lab 01/07/20 1427  AST  47*  ALT 25  ALKPHOS 64  BILITOT 0.6  PROT 7.0  ALBUMIN 3.6   Lipid Profile: Recent Labs    01/07/20 1427  TRIG 166*   Anemia Panel: Recent Labs    01/07/20 1427  FERRITIN 726*     Radiological Exams on Admission: DG Chest Port 1 View  Result Date: 01/07/2020 CLINICAL DATA:  Shortness of breath and cough with COVID-19 positivity, initial encounter EXAM: PORTABLE CHEST 1 VIEW COMPARISON:  05/05/2004 FINDINGS: Cardiac shadow is enlarged. Aortic calcifications are noted. The lungs are well aerated bilaterally. Patchy airspace opacities are noted left greater than right consistent with the given clinical history of COVID-19 positivity. No bony abnormality is noted. IMPRESSION: Patchy airspace opacities consistent with the given clinical history of COVID-19 positivity. Aortic Atherosclerosis (ICD10-I70.0). Electronically Signed   By: 05/07/2004 M.D.   On: 01/07/2020 16:25    My interpretation of Electrocardiogram: Sinus tachycardia in the 120s.  Left axis deviation.  No concerning ST or T wave changes.  Intervals appear to be normal.   Problem  List  Principal Problem:   Acute respiratory failure (HCC) Active Problems:   Pneumonia due to COVID-19 virus   Essential hypertension   ARF (acute renal failure) (HCC)   Obesity, Class III, BMI 40-49.9 (morbid obesity) (HCC)   Hyponatremia   Assessment: This is a 64 year old Caucasian female with past medical history of hypertension who comes in with a few day history of cough and worsening shortness of breath.  She tested positive for COVID-19 on Sunday.  She has exposure to COVID-19 last week.  Chest x-ray shows patchy airspace opacities.  She has acute respiratory failure with hypoxia.  Repeat COVID-19 test was ordered again in the ED and is pending.  Plan:  Acute Hypoxic Resp. Failure/Pneumonia due to COVID-19   Recent Labs  Lab 01/07/20 1427  DDIMER 2.36*  FERRITIN 726*  CRP 8.3*  ALT 25  PROCALCITON 0.23     Objective findings: Fever: Temperature of 102.8 F. Oxygen requirements: Currently saturating in the early 90s on 3 L/min.  COVID 19 Therapeutics: Antibacterials: None.  Procalcitonin 0.23. Remdesivir: Day 1 today Steroids: Solu-Medrol Diuretics: None Actemra/Baricitinib: Will be initiated on baricitinib due to high risk of decompensation.  Renally dosed PUD Prophylaxis: Initiate Pepcid DVT Prophylaxis: Subcutaneous heparin  From a respiratory standpoint patient is currently stable on 3 L of oxygen by nasal cannula saturating in the early 90s.  However she is morbidly obese.  She is at high risk for decompensation.  Patient will be started on remdesivir and steroids.  Elevated D-dimer is most likely due to COVID-19.  Continue to monitor trends.  Monitor glucose levels daily.  May need to initiate SSI if they are noted to be greater than 180.  Baricitinib was also discussed with the patient. Based on ACTT-2 and COV-BARRIER trials and other available data, baricitinib is being used under EUA by the FDA. The patient has no ESRD, known history of TB, severe neutropenia (ANC <500) or lymphopenia (ALC <200), or severe LFT elevations. They are not on DMARDs or probenecid, and are not pregnant. The option to use/refuse baricitinib treatment under FDA authorization (not approval), the significant known and potential risks and benefits, the extent to which these are unknown, and information regarding all available alternatives were discussed in detail. Specifically the risk of VTE and secondary infections were discussed in detail with the patient and/or HCPOA. They consent to proceed with treatment.  Patient encouraged to stay in prone position as much as possible.  Incentive spirometer.  Mobilization.  Continue to trend inflammatory markers.  The treatment plan and use of medications and known side effects were discussed with patient/family. Some of the medications used are based on case  reports/anecdotal data.  All other medications being used in the management of COVID-19 based on limited study data.  Complete risks and long-term side effects are unknown, however in the best clinical judgment they seem to be of some benefit.  Patient/family wanted to proceed with treatment options provided.  Acute kidney injury with mild hyponatremia and hypokalemia Most likely due to poor oral intake the last several days.  She is noted to be dehydrated.  She has dry mucous membranes.  She will be given IV fluids for the next 12 hours.  Recheck labs tomorrow morning.  Monitor urine output.  Check UA.  History of essential hypertension  Hold her antihypertensives since her blood pressure is borderline low.  She will be given IV fluids as discussed above.   DVT Prophylaxis: Subcutaneous heparin Code Status: Full code  Family Communication: Discussed with the patient Disposition: Hopefully return home when improved Consults called: None Admission Status: Status is: Inpatient  Remains inpatient appropriate because:IV treatments appropriate due to intensity of illness or inability to take PO and Inpatient level of care appropriate due to severity of illness   Dispo: The patient is from: Home              Anticipated d/c is to: Home              Anticipated d/c date is: > 3 days              Patient currently is not medically stable to d/c.  Severity of Illness: The appropriate patient status for this patient is INPATIENT. Inpatient status is judged to be reasonable and necessary in order to provide the required intensity of service to ensure the patient's safety. The patient's presenting symptoms, physical exam findings, and initial radiographic and laboratory data in the context of their chronic comorbidities is felt to place them at high risk for further clinical deterioration. Furthermore, it is not anticipated that the patient will be medically stable for discharge from the hospital within  2 midnights of admission. The following factors support the patient status of inpatient.   " The patient's presenting symptoms include shortness of breath. " The worrisome physical exam findings include tachypnea. " The initial radiographic and laboratory data are worrisome because of pneumonia. " The chronic co-morbidities include morbid obesity.   * I certify that at the point of admission it is my clinical judgment that the patient will require inpatient hospital care spanning beyond 2 midnights from the point of admission due to high intensity of service, high risk for further deterioration and high frequency of surveillance required.*  Further management decisions will depend on results of further testing and patient's response to treatment.   Audrey Thull [facility]  [facility] Pager on [email or web address]  01/07/2020, 6:08 PM

## 2020-01-07 NOTE — ED Notes (Signed)
ED Provider at bedside. 

## 2020-01-08 DIAGNOSIS — J9601 Acute respiratory failure with hypoxia: Secondary | ICD-10-CM

## 2020-01-08 DIAGNOSIS — E871 Hypo-osmolality and hyponatremia: Secondary | ICD-10-CM

## 2020-01-08 DIAGNOSIS — N179 Acute kidney failure, unspecified: Secondary | ICD-10-CM

## 2020-01-08 DIAGNOSIS — U071 COVID-19: Principal | ICD-10-CM

## 2020-01-08 DIAGNOSIS — J1282 Pneumonia due to coronavirus disease 2019: Secondary | ICD-10-CM

## 2020-01-08 LAB — URINALYSIS, ROUTINE W REFLEX MICROSCOPIC
Bilirubin Urine: NEGATIVE
Glucose, UA: NEGATIVE mg/dL
Ketones, ur: NEGATIVE mg/dL
Nitrite: POSITIVE — AB
Protein, ur: >=300 mg/dL — AB
Specific Gravity, Urine: 1.018 (ref 1.005–1.030)
pH: 5 (ref 5.0–8.0)

## 2020-01-08 LAB — COMPREHENSIVE METABOLIC PANEL
ALT: 25 U/L (ref 0–44)
AST: 50 U/L — ABNORMAL HIGH (ref 15–41)
Albumin: 3.3 g/dL — ABNORMAL LOW (ref 3.5–5.0)
Alkaline Phosphatase: 62 U/L (ref 38–126)
Anion gap: 10 (ref 5–15)
BUN: 41 mg/dL — ABNORMAL HIGH (ref 8–23)
CO2: 23 mmol/L (ref 22–32)
Calcium: 8.1 mg/dL — ABNORMAL LOW (ref 8.9–10.3)
Chloride: 106 mmol/L (ref 98–111)
Creatinine, Ser: 1.59 mg/dL — ABNORMAL HIGH (ref 0.44–1.00)
GFR calc Af Amer: 40 mL/min — ABNORMAL LOW (ref >60)
GFR calc non Af Amer: 35 mL/min — ABNORMAL LOW (ref >60)
Glucose, Bld: 162 mg/dL — ABNORMAL HIGH (ref 70–99)
Potassium: 3.9 mmol/L (ref 3.5–5.1)
Sodium: 139 mmol/L (ref 135–145)
Total Bilirubin: 0.4 mg/dL (ref 0.3–1.2)
Total Protein: 6.5 g/dL (ref 6.5–8.1)

## 2020-01-08 LAB — CBC WITH DIFFERENTIAL/PLATELET
Abs Immature Granulocytes: 0.03 10*3/uL (ref 0.00–0.07)
Basophils Absolute: 0 10*3/uL (ref 0.0–0.1)
Basophils Relative: 0 %
Eosinophils Absolute: 0 10*3/uL (ref 0.0–0.5)
Eosinophils Relative: 0 %
HCT: 38.2 % (ref 36.0–46.0)
Hemoglobin: 12.7 g/dL (ref 12.0–15.0)
Immature Granulocytes: 1 %
Lymphocytes Relative: 11 %
Lymphs Abs: 0.5 10*3/uL — ABNORMAL LOW (ref 0.7–4.0)
MCH: 30.5 pg (ref 26.0–34.0)
MCHC: 33.2 g/dL (ref 30.0–36.0)
MCV: 91.8 fL (ref 80.0–100.0)
Monocytes Absolute: 0.1 10*3/uL (ref 0.1–1.0)
Monocytes Relative: 2 %
Neutro Abs: 4.1 10*3/uL (ref 1.7–7.7)
Neutrophils Relative %: 86 %
Platelets: 143 10*3/uL — ABNORMAL LOW (ref 150–400)
RBC: 4.16 MIL/uL (ref 3.87–5.11)
RDW: 15 % (ref 11.5–15.5)
WBC: 4.8 10*3/uL (ref 4.0–10.5)
nRBC: 0 % (ref 0.0–0.2)

## 2020-01-08 LAB — HIV ANTIBODY (ROUTINE TESTING W REFLEX): HIV Screen 4th Generation wRfx: NONREACTIVE

## 2020-01-08 LAB — D-DIMER, QUANTITATIVE: D-Dimer, Quant: 1.69 ug/mL-FEU — ABNORMAL HIGH (ref 0.00–0.50)

## 2020-01-08 LAB — MAGNESIUM: Magnesium: 1.9 mg/dL (ref 1.7–2.4)

## 2020-01-08 LAB — C-REACTIVE PROTEIN: CRP: 7.6 mg/dL — ABNORMAL HIGH (ref <1.0)

## 2020-01-08 MED ORDER — ORAL CARE MOUTH RINSE
15.0000 mL | Freq: Two times a day (BID) | OROMUCOSAL | Status: DC
  Administered 2020-01-09 – 2020-01-10 (×4): 15 mL via OROMUCOSAL

## 2020-01-08 NOTE — ED Notes (Signed)
ED TO INPATIENT HANDOFF REPORT  ED Nurse Name and Phone #: Sharene SkeansJeneen Kennan Detter [phone]  S Name/Age/Gender Julie Mcguire 64 y.o. female Room/Bed: WA16/WA16  Code Status   Code Status: Full Code  Home/SNF/Other Home Patient oriented to: self, place, time and situation Is this baseline? Yes   Triage Complete: Triage complete  Chief Complaint Acute respiratory failure (HCC) [J96.00]  Triage Note Pt arrives EMS from home with complaints of SHOB, cough, fever. Pt reports a positive COVID test on Sunday. Pt reports she began having increased SHOB last 2 days. Per EMS: Pt was 84% on RA upon arrival. Pt placed on 3L of O2 via [place]- Sats up to 94% on 3L. EMS administered 1000mg  Tylenol PO.     Allergies Allergies  Allergen Reactions  . Codeine Nausea And Vomiting    Level of Care/Admitting Diagnosis ED Disposition    ED Disposition Condition Comment   Admit  [facility] [[id number]]  Level of Care: Telemetry [5]  Admit to tele based on following criteria: Complex arrhythmia (Bradycardia/Tachycardia)  [facility] or [facility] if equivalent level of care is available:: No  Covid Evaluation: Confirmed COVID Positive  Diagnosis: Acute respiratory failure (HCC) [518.81.ICD-9-CM]  Admitting Physician: Osvaldo ShipperKRISHNAN, GOKUL [[id number]]  Attending Physician: Osvaldo ShipperKRISHNAN, GOKUL [[id number]]  Estimated length of stay: 3 - 4 days  Certification:: I certify this patient will need inpatient services for at least 2 midnights       B Medical/Surgery History Past Medical History:  Diagnosis Date  . Hypertension    Past Surgical History:  Procedure Laterality Date  . ABDOMINAL HYSTERECTOMY    . APPENDECTOMY    . CHOLECYSTECTOMY    . TONSILLECTOMY    . TOTAL HIP ARTHROPLASTY Left 09/22/2017   Procedure: LEFT TOTAL HIP ARTHROPLASTY ANTERIOR APPROACH;  Surgeon: Kathryne HitchBlackman, Christopher Y, MD;  Location: [facility];  Service: Orthopedics;  Laterality: Left;      A IV Location/Drains/Wounds Patient Lines/Drains/Airways Status    Active Line/Drains/Airways    Name Placement date Placement time Site Days   Peripheral IV 01/07/20 Right Antecubital 01/07/20  1516  Antecubital  1   Incision (Closed) 09/22/17 Hip Left 09/22/17  1423   838          Intake/Output Last 24 hours  Intake/Output Summary (Last 24 hours) at 01/08/2020 2007 Last data filed at 01/08/2020 1400 Gross per 24 hour  Intake 801.75 ml  Output --  Net 801.75 ml    Labs/Imaging Results for orders placed or performed during the hospital encounter of 01/07/20 (from the past 48 hour(s))  Lactic acid, plasma     Status: None   Collection Time: 01/07/20  2:27 PM  Result Value Ref Range   Lactic Acid, Venous 1.3 0.5 - 1.9 mmol/L    Comment: Performed at [facility], [address] [place], [place] [id number]  CBC WITH DIFFERENTIAL     Status: Abnormal   Collection Time: 01/07/20  2:27 PM  Result Value Ref Range   WBC 6.5 4.0 - 10.5 K/uL   RBC 4.48 3.87 - 5.11 MIL/uL   Hemoglobin 13.6 12.0 - 15.0 g/dL   HCT 81.141.1 36 - 46 %   MCV 91.7 80.0 - 100.0 fL   MCH 30.4 26.0 - 34.0 pg   MCHC 33.1 30.0 - 36.0 g/dL   RDW 91.414.9 78.211.5 - 95.615.5 %   Platelets 153 150 - 400 K/uL   nRBC 0.0 0.0 - 0.2 %  Neutrophils Relative % 84 %   Neutro Abs 5.5 1.7 - 7.7 K/uL   Lymphocytes Relative 9 %   Lymphs Abs 0.6 (L) 0.7 - 4.0 K/uL   Monocytes Relative 6 %   Monocytes Absolute 0.4 0 - 1 K/uL   Eosinophils Relative 0 %   Eosinophils Absolute 0.0 0 - 0 K/uL   Basophils Relative 0 %   Basophils Absolute 0.0 0 - 0 K/uL   Immature Granulocytes 1 %   Abs Immature Granulocytes 0.04 0.00 - 0.07 K/uL    Comment: Performed at [facility], [address], [place], [place] [address]  Comprehensive metabolic panel     Status: Abnormal   Collection Time: 01/07/20  2:27 PM  Result Value Ref Range   Sodium 134 (L) 135 - 145 mmol/L   Potassium 3.4 (L) 3.5 - 5.1 mmol/L    Chloride 99 98 - 111 mmol/L   CO2 22 22 - 32 mmol/L   Glucose, Bld 151 (H) 70 - 99 mg/dL    Comment: Glucose reference range applies only to samples taken after fasting for at least 8 hours.   BUN 35 (H) 8 - 23 mg/dL   Creatinine, Ser 1.02 (H) 0.44 - 1.00 mg/dL   Calcium 8.1 (L) 8.9 - 10.3 mg/dL   Total Protein 7.0 6.5 - 8.1 g/dL   Albumin 3.6 3.5 - 5.0 g/dL   AST 47 (H) 15 - 41 U/L   ALT 25 0 - 44 U/L   Alkaline Phosphatase 64 38 - 126 U/L   Total Bilirubin 0.6 0.3 - 1.2 mg/dL   GFR calc non Af Amer 36 (L) >60 mL/min   GFR calc Af Amer 41 (L) >60 mL/min   Anion gap 13 5 - 15    Comment: Performed at [facility], [address], [place], [place] [address]  D-dimer, quantitative     Status: Abnormal   Collection Time: 01/07/20  2:27 PM  Result Value Ref Range   D-Dimer, Quant 2.36 (H) 0.00 - 0.50 ug/mL-FEU    Comment: (NOTE) At the manufacturer cut-off of 0.50 ug/mL FEU, this assay has been documented to exclude PE with a sensitivity and negative predictive value of 97 to 99%.  At this time, this assay has not been approved by the FDA to exclude DVT/VTE. Results should be correlated with clinical presentation. Performed at [facility], [address], [place], [place] [address]   Procalcitonin     Status: None   Collection Time: 01/07/20  2:27 PM  Result Value Ref Range   Procalcitonin 0.23 ng/mL    Comment:        Interpretation: PCT (Procalcitonin) <= 0.5 ng/mL: Systemic infection (sepsis) is not likely. Local bacterial infection is possible. (NOTE)       Sepsis PCT Algorithm           Lower Respiratory Tract                                      Infection PCT Algorithm    ----------------------------     ----------------------------         PCT < 0.25 ng/mL                PCT < 0.10 ng/mL          Strongly encourage  Strongly discourage   discontinuation of antibiotics    initiation of antibiotics     ----------------------------     -----------------------------       PCT 0.25 - 0.50 ng/mL            PCT 0.10 - 0.25 ng/mL               OR       >80% decrease in PCT            Discourage initiation of                                            antibiotics      Encourage discontinuation           of antibiotics    ----------------------------     -----------------------------         PCT >= 0.50 ng/mL              PCT 0.26 - 0.50 ng/mL               AND        <80% decrease in PCT             Encourage initiation of                                             antibiotics       Encourage continuation           of antibiotics    ----------------------------     -----------------------------        PCT >= 0.50 ng/mL                  PCT > 0.50 ng/mL               AND         increase in PCT                  Strongly encourage                                      initiation of antibiotics    Strongly encourage escalation           of antibiotics                                     -----------------------------                                           PCT <= 0.25 ng/mL                                                 OR                                        >  80% decrease in PCT                                      Discontinue / Do not initiate                                             antibiotics  Performed at [facility], [address] [place], [place] [address]   Lactate dehydrogenase     Status: Abnormal   Collection Time: 01/07/20  2:27 PM  Result Value Ref Range   LDH 335 (H) 98 - 192 U/L    Comment: Performed at [facility], [address], [place], [place] [address]  Ferritin     Status: Abnormal   Collection Time: 01/07/20  2:27 PM  Result Value Ref Range   Ferritin 726 (H) 11 - 307 ng/mL    Comment: Performed at [facility], [address] [place], [place] [address]  Triglycerides     Status: Abnormal    Collection Time: 01/07/20  2:27 PM  Result Value Ref Range   Triglycerides 166 (H) <150 mg/dL    Comment: Performed at [facility], [address], [place], [place] [address]  Fibrinogen     Status: None   Collection Time: 01/07/20  2:27 PM  Result Value Ref Range   Fibrinogen 379 210 - 475 mg/dL    Comment: Performed at [facility], [address], [place], [place] [address]  C-reactive protein     Status: Abnormal   Collection Time: 01/07/20  2:27 PM  Result Value Ref Range   CRP 8.3 (H) <1.0 mg/dL    Comment: Performed at [facility], [address], [place], [place] [address]  Blood Culture (routine x 2)     Status: None (Preliminary result)   Collection Time: 01/07/20  2:30 PM   Specimen: BLOOD RIGHT HAND  Result Value Ref Range   Specimen Description      BLOOD RIGHT HAND Performed at [facility], [address], [place], [place] [address]    Special Requests      BOTTLES DRAWN AEROBIC AND ANAEROBIC Blood Culture adequate volume Performed at [facility], [address] [place], [place] [address]    Culture      NO GROWTH < 24 HOURS Performed at [facility], [address], [place], [place] [address]    Report Status PENDING   Blood Culture (routine x 2)     Status: None (Preliminary result)   Collection Time: 01/07/20  3:03 PM   Specimen: BLOOD  Result Value Ref Range   Specimen Description      BLOOD RIGHT ANTECUBITAL Performed at [facility], [address], [place], [place] [address]    Special Requests      BOTTLES DRAWN AEROBIC AND ANAEROBIC Blood Culture results may not be optimal due to an excessive volume of blood received in culture bottles Performed at [facility], [address], [place], [place] [address]    Culture      NO GROWTH < 24 HOURS Performed at [facility], [address],  [place], [place] [address]    Report Status PENDING  SARS Coronavirus 2 by RT PCR (hospital order, performed in [facility] hospital lab) Nasopharyngeal Nasopharyngeal Swab     Status: Abnormal   Collection Time: 01/07/20  3:09 PM   Specimen: Nasopharyngeal Swab  Result Value Ref Range   SARS Coronavirus 2 POSITIVE (A) NEGATIVE    Comment: RESULT CALLED TO, READ BACK BY AND VERIFIED WITH: WEST,S. RN  01/07/20 BILLINGSLEY,L (NOTE) SARS-CoV-2 target nucleic acids are DETECTED  SARS-CoV-2 RNA is generally detectable in upper respiratory specimens  during the acute phase of infection.  Positive results are indicative  of the presence of the identified virus, but do not rule out bacterial infection or co-infection with other pathogens not detected by the test.  Clinical correlation with patient history and  other diagnostic information is necessary to determine patient infection status.  The expected result is negative.  Fact Sheet for Patients:   BoilerBrush.com.cy   Fact Sheet for Healthcare Providers:   https://pope.com/    This test is not yet approved or cleared by the [place] FDA and  has been authorized for detection and/or diagnosis of SARS-CoV-2 by FDA under an Emergency Use Authorization (EUA).  This EUA will remain in effect (meaning thi s test can be used) for the duration of  the COVID-19 declaration under Section 564(b)(1) of the Act, 21 U.S.C. section 360-bbb-3(b)(1), unless the authorization is terminated or revoked sooner.  Performed at [facility], [address] [place], [place] [address]   Lactic acid, plasma     Status: None   Collection Time: 01/07/20  5:05 PM  Result Value Ref Range   Lactic Acid, Venous 1.0 0.5 - 1.9 mmol/L    Comment: Performed at [facility], [address], [place], [place] [address]  POC occult blood, ED RN will collect     Status: Abnormal    Collection Time: 01/07/20  7:02 PM  Result Value Ref Range   Fecal Occult Bld POSITIVE (A) NEGATIVE  HIV Antibody (routine testing w rflx)     Status: None   Collection Time: 01/08/20  5:00 AM  Result Value Ref Range   HIV Screen 4th Generation wRfx Non Reactive Non Reactive    Comment: Performed at [facility], [address], [place], [place] [address]  CBC with Differential/Platelet     Status: Abnormal   Collection Time: 01/08/20  5:00 AM  Result Value Ref Range   WBC 4.8 4.0 - 10.5 K/uL   RBC 4.16 3.87 - 5.11 MIL/uL   Hemoglobin 12.7 12.0 - 15.0 g/dL   HCT 91.4 36 - 46 %   MCV 91.8 80.0 - 100.0 fL   MCH 30.5 26.0 - 34.0 pg   MCHC 33.2 30.0 - 36.0 g/dL   RDW 78.2 95.6 - 21.3 %   Platelets 143 (L) 150 - 400 K/uL   nRBC 0.0 0.0 - 0.2 %   Neutrophils Relative % 86 %   Neutro Abs 4.1 1.7 - 7.7 K/uL   Lymphocytes Relative 11 %   Lymphs Abs 0.5 (L) 0.7 - 4.0 K/uL   Monocytes Relative 2 %   Monocytes Absolute 0.1 0 - 1 K/uL   Eosinophils Relative 0 %   Eosinophils Absolute 0.0 0 - 0 K/uL   Basophils Relative 0 %   Basophils Absolute 0.0 0 - 0 K/uL   Immature Granulocytes 1 %   Abs Immature Granulocytes 0.03 0.00 - 0.07 K/uL   Reactive, Benign Lymphocytes PRESENT  Comment: Performed at [facility], [address], [place], [place] [address]  Comprehensive metabolic panel     Status: Abnormal   Collection Time: 01/08/20  5:00 AM  Result Value Ref Range   Sodium 139 135 - 145 mmol/L   Potassium 3.9 3.5 - 5.1 mmol/L   Chloride 106 98 - 111 mmol/L   CO2 23 22 - 32 mmol/L   Glucose, Bld 162 (H) 70 - 99 mg/dL    Comment: Glucose reference range applies only to samples taken after fasting for at least 8 hours.   BUN 41 (H) 8 - 23 mg/dL   Creatinine, Ser 8.11 (H) 0.44 - 1.00 mg/dL   Calcium 8.1 (L) 8.9 - 10.3 mg/dL   Total Protein 6.5 6.5 - 8.1 g/dL   Albumin 3.3 (L) 3.5 - 5.0 g/dL   AST 50 (H) 15 - 41 U/L   ALT 25 0 - 44 U/L   Alkaline Phosphatase  62 38 - 126 U/L   Total Bilirubin 0.4 0.3 - 1.2 mg/dL   GFR calc non Af Amer 35 (L) >60 mL/min   GFR calc Af Amer 40 (L) >60 mL/min   Anion gap 10 5 - 15    Comment: Performed at [facility], [address] [place], [place] [address]  C-reactive protein     Status: Abnormal   Collection Time: 01/08/20  5:00 AM  Result Value Ref Range   CRP 7.6 (H) <1.0 mg/dL    Comment: Performed at [facility], [address], [place], [place] [address]  D-dimer, quantitative (not at [facility])     Status: Abnormal   Collection Time: 01/08/20  5:00 AM  Result Value Ref Range   D-Dimer, Quant 1.69 (H) 0.00 - 0.50 ug/mL-FEU    Comment: (NOTE) At the manufacturer cut-off of 0.50 ug/mL FEU, this assay has been documented to exclude PE with a sensitivity and negative predictive value of 97 to 99%.  At this time, this assay has not been approved by the FDA to exclude DVT/VTE. Results should be correlated with clinical presentation. Performed at [facility], [address], [place], [place] [address]   Magnesium     Status: None   Collection Time: 01/08/20  5:00 AM  Result Value Ref Range   Magnesium 1.9 1.7 - 2.4 mg/dL    Comment: Performed at [facility], [address], [place], [place] [address]   DG Chest Port 1 View  Result Date: 01/07/2020 CLINICAL DATA:  Shortness of breath and cough with COVID-19 positivity, initial encounter EXAM: PORTABLE CHEST 1 VIEW COMPARISON:  05/05/2004 FINDINGS: Cardiac shadow is enlarged. Aortic calcifications are noted. The lungs are well aerated bilaterally. Patchy airspace opacities are noted left greater than right consistent with the given clinical history of COVID-19 positivity. No bony abnormality is noted. IMPRESSION: Patchy airspace opacities consistent with the given clinical history of COVID-19 positivity. Aortic Atherosclerosis (ICD10-I70.0). Electronically Signed   By: Alcide Clever  M.D.   On: 01/07/2020 16:25    Pending Labs Unresulted Labs (From admission, onward)          Start     Ordered   01/08/20 0500  CBC with Differential/Platelet  Daily,   R      01/07/20 1802   01/08/20 0500  Comprehensive metabolic panel  Daily,   R      01/07/20 1802   01/08/20 0500  C-reactive protein  Daily,   R  01/07/20 1802   01/08/20 0500  D-dimer, quantitative (not at [facility])  Daily,   R      01/07/20 1802   01/08/20 0500  Magnesium  Daily,   R      01/07/20 1802   01/07/20 1819  Urinalysis, Routine w reflex microscopic Urine, Clean Catch  Once,   STAT        01/07/20 1818          Vitals/Pain Today's Vitals   01/08/20 1600 01/08/20 1803 01/08/20 1830 01/08/20 1900  BP: 137/83 120/77 125/86 130/84  Pulse: (!) 118 (!) 120 (!) 116 (!) 117  Resp: (!) 22 20  20   Temp:      TempSrc:      SpO2: 94% 92% 92% 98%  Weight:      Height:      PainSc:        Isolation Precautions Airborne and Contact precautions  Medications Medications  oxybutynin (DITROPAN) tablet 5 mg (5 mg Oral Given 01/08/20 1017)  0.9 %  sodium chloride infusion ( Intravenous Stopped 01/08/20 0655)  remdesivir 200 mg in sodium chloride 0.9% 250 mL IVPB (0 mg Intravenous Stopped 01/07/20 1923)    Followed by  remdesivir 100 mg in sodium chloride 0.9 % 100 mL IVPB (0 mg Intravenous Stopped 01/08/20 1400)  guaiFENesin-dextromethorphan (ROBITUSSIN DM) 100-10 MG/5ML syrup 10 mL (has no administration in time range)  ascorbic acid (VITAMIN C) tablet 500 mg (500 mg Oral Given 01/08/20 1016)  zinc sulfate capsule 220 mg (220 mg Oral Given 01/08/20 1016)  acetaminophen (TYLENOL) tablet 650 mg (650 mg Oral Given 01/07/20 2255)  ondansetron (ZOFRAN) tablet 4 mg (has no administration in time range)    Or  ondansetron (ZOFRAN) injection 4 mg (has no administration in time range)  baricitinib (OLUMIANT) tablet 2 mg (2 mg Oral Given 01/08/20 1016)  methylPREDNISolone sodium succinate (SOLU-MEDROL) 125 mg/2 mL  injection 60 mg (60 mg Intravenous Given 01/08/20 1907)  famotidine (PEPCID) tablet 20 mg (20 mg Oral Given 01/08/20 1016)  pantoprazole (PROTONIX) injection 40 mg (40 mg Intravenous Given 01/07/20 2308)  lactated ringers bolus 1,000 mL (0 mLs Intravenous Stopped 01/07/20 1857)  potassium chloride SA (KLOR-CON) CR tablet 20 mEq (20 mEq Oral Given 01/07/20 2252)    Mobility walks with person assist Low fall risk   Focused Assessments Pulmonary Assessment Handoff:  Lung sounds:   O2 Device: Nasal Cannula O2 Flow Rate (L/min): 3 L/min      R Recommendations: See Admitting Provider Note  Report given to: Kirsten RN  Additional Notes:

## 2020-01-08 NOTE — Progress Notes (Signed)
Patient refused measurements of the MASD at this time due to the pain.

## 2020-01-08 NOTE — Progress Notes (Signed)
PROGRESS NOTE    Julie Mcguire  UYE:[id number] DOB: 1955-10-15 DOA: 01/07/2020 PCP: Elias Else, MD   Brief Narrative:  Julie Mcguire is a 64 y.o. female with a past medical history of hypertension who was exposed to her daughter who was sick with Covid last week.  Patient's symptoms started on Saturday consisting of body aches fatigue headache.  Denies any GI symptoms such as nausea vomiting or diarrhea.  Subsequently she developed a cough and also had shortness of breath.  Initially with exertion subsequently at rest.  She decided to come to the hospital for evaluation.  She has not been vaccinated against COVID-19.  Denies any chest pain.  Had cough with clear expectoration. In the emergency department patient was found to have oxygen saturation in the 80s.  She was placed on oxygen.  Evaluation revealed pneumonia.  Inflammatory markers were elevated.  Patient was hospitalized for further management.   Assessment & Plan:   Principal Problem:   Acute respiratory failure (HCC) Active Problems:   Pneumonia due to COVID-19 virus   Essential hypertension   ARF (acute renal failure) (HCC)   Obesity, Class III, BMI 40-49.9 (morbid obesity) (HCC)   Hyponatremia  Acute hypoxic respiratory failure secondary to covid-19 pneumonia, POA - Continue supplemental oxygen as required - keep sats 88-92%, transient hypoxia with exertion into high 70s/low80s allowed SpO2: 98 % O2 Flow Rate (L/min): 3 L/min - Continue Remdesivir 8/24-28 - Continue solumedrol 8/24-9/2 - Antibacterials: None.  Procalcitonin 0.23 - Diuretics: None - Continue Baricitinib: Will be initiated on baricitinib due to high risk of decompensation.  Renally dose PUD Prophylaxis: Initiate Pepcid DVT Prophylaxis: Heparin subQ stopped as below  Acute GI bleed given maroon stool reported overnight - FOBT positive  - H/H this am stable - remains WNL - no further episodes of bleeding noted - Hold anticoagulation  Acute  kidney injury with mild hyponatremia and hypokalemia - Most likely due to poor oral intake - Dry mucous membranes/appears dehydrated on exam - Continue IVF as PO intake improves  History of essential hypertension  Hold her antihypertensives since her blood pressure is borderline low.  She will be given IV fluids as discussed above.  DVT Prophylaxis: Currently on hold given GI bleed as above Code Status: Full code Family Communication: Discussed with the patient  Status Inpatient  - Remains inpatient appropriate because:IV treatments appropriate due to intensity of illness or inability to take PO and Inpatient level of care appropriate due to severity of illness  - Dispo: The patient is from: Home  Anticipated d/c is to: Home  Anticipated d/c date is: > 3 days  Patient currently is not medically stable to d/c.  Consultants:   None  Procedures:   None  Antimicrobials:  Remdesivir   Subjective: No acute issues/events noted other than maroon stool late yesterday evening, patient denies any abdominal pain, painful defecation, nausea, vomiting, diarrhea, headache, chest pain, fevers or chills.  Objective: Vitals:   01/08/20 0519 01/08/20 0601 01/08/20 0700 01/08/20 0730  BP: (!) 134/92 (!) 159/91 (!) 151/80 (!) 151/95  Pulse: 95 94 91 99  Resp: 20 17 19 18   Temp:      TempSrc:      SpO2: 95% 94% 97% 93%  Weight:      Height:        Intake/Output Summary (Last 24 hours) at 01/08/2020 0821 Last data filed at 01/08/2020 0655 Gross per 24 hour  Intake 2704.98 ml  Output --  Net  2704.98 ml   Filed Weights   01/07/20 1420  Weight: 117.9 kg    Examination:  General exam: Appears calm and comfortable  Respiratory system: Clear to auscultation. Respiratory effort normal. Cardiovascular system: S1 & S2 heard, RRR. No JVD, murmurs, rubs, gallops or clicks. No pedal edema. Gastrointestinal system: Abdomen is nondistended, soft and  nontender. No organomegaly or masses felt. Normal bowel sounds heard. Central nervous system: Alert and oriented. No focal neurological deficits. Extremities: Symmetric 5 x 5 power. Skin: No rashes, lesions or ulcers Psychiatry: Judgement and insight appear normal. Mood & affect appropriate.     Data Reviewed: I have personally reviewed following labs and imaging studies  CBC: Recent Labs  Lab 01/07/20 1427 01/08/20 0500  WBC 6.5 4.8  NEUTROABS 5.5 4.1  HGB 13.6 12.7  HCT 41.1 38.2  MCV 91.7 91.8  PLT 153 143*   Basic Metabolic Panel: Recent Labs  Lab 01/07/20 1427 01/08/20 0500  NA 134* 139  K 3.4* 3.9  CL 99 106  CO2 22 23  GLUCOSE 151* 162*  BUN 35* 41*  CREATININE 1.55* 1.59*  CALCIUM 8.1* 8.1*  MG  --  1.9   GFR: Estimated Creatinine Clearance: 46.9 mL/min (A) (by C-G formula based on SCr of 1.59 mg/dL (H)). Liver Function Tests: Recent Labs  Lab 01/07/20 1427 01/08/20 0500  AST 47* 50*  ALT 25 25  ALKPHOS 64 62  BILITOT 0.6 0.4  PROT 7.0 6.5  ALBUMIN 3.6 3.3*   No results for input(s): LIPASE, AMYLASE in the last 168 hours. No results for input(s): AMMONIA in the last 168 hours. Coagulation Profile: No results for input(s): INR, PROTIME in the last 168 hours. Cardiac Enzymes: No results for input(s): CKTOTAL, CKMB, CKMBINDEX, TROPONINI in the last 168 hours. BNP (last 3 results) No results for input(s): PROBNP in the last 8760 hours. HbA1C: No results for input(s): HGBA1C in the last 72 hours. CBG: No results for input(s): GLUCAP in the last 168 hours. Lipid Profile: Recent Labs    01/07/20 1427  TRIG 166*   Thyroid Function Tests: No results for input(s): TSH, T4TOTAL, FREET4, T3FREE, THYROIDAB in the last 72 hours. Anemia Panel: Recent Labs    01/07/20 1427  FERRITIN 726*   Sepsis Labs: Recent Labs  Lab 01/07/20 1427 01/07/20 1705  PROCALCITON 0.23  --   LATICACIDVEN 1.3 1.0    Recent Results (from the past 240 hour(s))    Blood Culture (routine x 2)     Status: None (Preliminary result)   Collection Time: 01/07/20  2:30 PM   Specimen: BLOOD RIGHT HAND  Result Value Ref Range Status   Specimen Description   Final    BLOOD RIGHT HAND Performed at [facility], [address], [place], [place] [address]    Special Requests   Final    BOTTLES DRAWN AEROBIC AND ANAEROBIC Blood Culture adequate volume Performed at [facility], [address], [place], [place] [address]    Culture   Final    NO GROWTH < 24 HOURS Performed at [facility], [address], [place], [place] [address]    Report Status PENDING  Incomplete  Blood Culture (routine x 2)     Status: None (Preliminary result)   Collection Time: 01/07/20  3:03 PM   Specimen: BLOOD  Result Value Ref Range Status   Specimen Description   Final    BLOOD RIGHT ANTECUBITAL Performed at [facility], [address] [place], [place] [address]    Special Requests   Final    BOTTLES DRAWN AEROBIC AND ANAEROBIC Blood Culture results may not be optimal due to an excessive volume of blood received in culture bottles Performed at [facility], [address] [place], [place] [address]    Culture   Final    NO GROWTH < 24 HOURS Performed at [facility], [address], [place], [place] [address]    Report Status PENDING  Incomplete  SARS Coronavirus 2 by RT PCR (hospital order, performed in [facility] hospital lab) Nasopharyngeal Nasopharyngeal Swab     Status: Abnormal   Collection Time: 01/07/20  3:09 PM   Specimen: Nasopharyngeal Swab  Result Value Ref Range Status   SARS Coronavirus 2 POSITIVE (A) NEGATIVE Final    Comment: RESULT CALLED TO, READ BACK BY AND VERIFIED WITH: WEST,S. RN @1816  01/07/20 BILLINGSLEY,L (NOTE) SARS-CoV-2 target nucleic acids are DETECTED  SARS-CoV-2 RNA is generally detectable in upper respiratory specimens  during the acute phase  of infection.  Positive results are indicative  of the presence of the identified virus, but do not rule out bacterial infection or co-infection with other pathogens not detected by the test.  Clinical correlation with patient history and  other diagnostic information is necessary to determine patient infection status.  The expected result is negative.  Fact Sheet for Patients:   01/09/20   Fact Sheet for Healthcare Providers:   BoilerBrush.com.cy    This test is not yet approved or cleared by the https://pope.com/ FDA and  has been authorized for detection and/or diagnosis of SARS-CoV-2 by FDA under an Emergency Use Authorization (EUA).  This EUA will remain in effect (meaning thi s test can be used) for the duration of  the COVID-19 declaration under Section 564(b)(1) of the Act, 21 U.S.C. section 360-bbb-3(b)(1), unless the authorization is terminated or revoked sooner.  Performed at [facility], [address] [place], [place]       Radiology Studies: [facility] Chest Port 1 View  Result Date: 01/07/2020 CLINICAL DATA:  Shortness of breath and cough with COVID-19 positivity, initial encounter EXAM: PORTABLE CHEST 1 VIEW COMPARISON:  05/05/2004 FINDINGS: Cardiac shadow is enlarged. Aortic calcifications are noted. The lungs are well aerated bilaterally. Patchy airspace opacities are noted left greater than right consistent with the given clinical history of COVID-19 positivity. No bony abnormality is noted. IMPRESSION: Patchy airspace opacities consistent with the given clinical history of COVID-19 positivity. Aortic Atherosclerosis (ICD10-I70.0). Electronically Signed   By: 05/07/2004 M.D.   On: 01/07/2020 16:25    Scheduled Meds: . vitamin C  500 mg Oral Daily  . baricitinib  2 mg Oral Daily  . famotidine  20 mg Oral Daily  . heparin  5,000 Units Subcutaneous Q8H  . methylPREDNISolone (SOLU-MEDROL)  injection  60 mg Intravenous Q12H  . oxybutynin  5 mg Oral BID  . pantoprazole (PROTONIX) IV  40 mg Intravenous QHS  . zinc sulfate  220 mg Oral Daily   Continuous Infusions: . remdesivir 100 mg in NS 100 mL       LOS: 1 day   Time spent: 01/09/2020  , DO [facility]  If 7PM-7AM, please contact night-coverage [email or web address]  01/08/2020, 8:21 AM

## 2020-01-09 LAB — COMPREHENSIVE METABOLIC PANEL
ALT: 25 U/L (ref 0–44)
AST: 45 U/L — ABNORMAL HIGH (ref 15–41)
Albumin: 3.2 g/dL — ABNORMAL LOW (ref 3.5–5.0)
Alkaline Phosphatase: 55 U/L (ref 38–126)
Anion gap: 11 (ref 5–15)
BUN: 39 mg/dL — ABNORMAL HIGH (ref 8–23)
CO2: 23 mmol/L (ref 22–32)
Calcium: 8.7 mg/dL — ABNORMAL LOW (ref 8.9–10.3)
Chloride: 107 mmol/L (ref 98–111)
Creatinine, Ser: 1.21 mg/dL — ABNORMAL HIGH (ref 0.44–1.00)
GFR calc Af Amer: 56 mL/min — ABNORMAL LOW (ref >60)
GFR calc non Af Amer: 48 mL/min — ABNORMAL LOW (ref >60)
Glucose, Bld: 149 mg/dL — ABNORMAL HIGH (ref 70–99)
Potassium: 4 mmol/L (ref 3.5–5.1)
Sodium: 141 mmol/L (ref 135–145)
Total Bilirubin: 0.1 mg/dL — ABNORMAL LOW (ref 0.3–1.2)
Total Protein: 6.4 g/dL — ABNORMAL LOW (ref 6.5–8.1)

## 2020-01-09 LAB — CBC WITH DIFFERENTIAL/PLATELET
Abs Immature Granulocytes: 0.03 10*3/uL (ref 0.00–0.07)
Basophils Absolute: 0 10*3/uL (ref 0.0–0.1)
Basophils Relative: 0 %
Eosinophils Absolute: 0 10*3/uL (ref 0.0–0.5)
Eosinophils Relative: 0 %
HCT: 38.6 % (ref 36.0–46.0)
Hemoglobin: 12.5 g/dL (ref 12.0–15.0)
Immature Granulocytes: 1 %
Lymphocytes Relative: 18 %
Lymphs Abs: 1.1 10*3/uL (ref 0.7–4.0)
MCH: 29.9 pg (ref 26.0–34.0)
MCHC: 32.4 g/dL (ref 30.0–36.0)
MCV: 92.3 fL (ref 80.0–100.0)
Monocytes Absolute: 0.4 10*3/uL (ref 0.1–1.0)
Monocytes Relative: 8 %
Neutro Abs: 4.3 10*3/uL (ref 1.7–7.7)
Neutrophils Relative %: 73 %
Platelets: 157 10*3/uL (ref 150–400)
RBC: 4.18 MIL/uL (ref 3.87–5.11)
RDW: 14.8 % (ref 11.5–15.5)
WBC: 5.8 10*3/uL (ref 4.0–10.5)
nRBC: 0 % (ref 0.0–0.2)

## 2020-01-09 LAB — C-REACTIVE PROTEIN: CRP: 3.7 mg/dL — ABNORMAL HIGH (ref <1.0)

## 2020-01-09 LAB — D-DIMER, QUANTITATIVE: D-Dimer, Quant: 1.59 ug/mL-FEU — ABNORMAL HIGH (ref 0.00–0.50)

## 2020-01-09 LAB — MAGNESIUM: Magnesium: 1.9 mg/dL (ref 1.7–2.4)

## 2020-01-09 NOTE — Progress Notes (Signed)
PROGRESS NOTE    Julie Mcguire  GYK:[id number] DOB: 11/14/1955 DOA: 01/07/2020 PCP: Elias Else, MD   Brief Narrative:  Julie Mcguire is a 64 y.o. female with a past medical history of hypertension who was exposed to her daughter who was sick with Covid last week.  Patient's symptoms started on Saturday consisting of body aches fatigue headache.  Denies any GI symptoms such as nausea vomiting or diarrhea.  Subsequently she developed a cough and also had shortness of breath.  Initially with exertion subsequently at rest.  She decided to come to the hospital for evaluation.  She has not been vaccinated against COVID-19.  Denies any chest pain.  Had cough with clear expectoration. In the emergency department patient was found to have oxygen saturation in the 80s.  She was placed on oxygen.  Evaluation revealed pneumonia.  Inflammatory markers were elevated.  Patient was hospitalized for further management.   Assessment & Plan:   Principal Problem:   Acute respiratory failure (HCC) Active Problems:   Pneumonia due to COVID-19 virus   Essential hypertension   ARF (acute renal failure) (HCC)   Obesity, Class III, BMI 40-49.9 (morbid obesity) (HCC)   Hyponatremia   Acute hypoxic respiratory failure secondary to covid-19 pneumonia, POA - Continue supplemental oxygen as required - keep sats 88-92%, transient hypoxia with exertion into high 70s/low80s allowed SpO2: 92 % O2 Flow Rate (L/min): 3 L/min - Continue Remdesivir 8/24-28 - Continue solumedrol 8/24-9/2 - Antibacterials: None.  Procalcitonin 0.23 - Diuretics: None - Continue Baricitinib 8/24-ongoing - PUD Prophylaxis: Initiate Pepcid - DVT Prophylaxis: Heparin subQ stopped as below Recent Labs    01/07/20 1427 01/08/20 0500 01/09/20 0332  DDIMER 2.36* 1.69* 1.59*  FERRITIN 726*  --   --   LDH 335*  --   --   CRP 8.3* 7.6* 3.7*   Acute GI bleed given maroon stool reported overnight - FOBT positive  - H/H this am stable  - remains WNL - no further episodes of bleeding noted - Hold anticoagulation Hemoglobin & Hematocrit     Component Value Date/Time   HGB 12.5 01/09/2020 0332   HCT 38.6 01/09/2020 0332   Acute kidney injury with mild hyponatremia and hypokalemia, without history of CKD - Most likely due to poor oral intake - Dry mucous membranes/appears dehydrated on exam - Continue IVF as PO intake improves Lab Results  Component Value Date   CREATININE 1.21 (H) 01/09/2020   CREATININE 1.59 (H) 01/08/2020   CREATININE 1.55 (H) 01/07/2020    History of essential hypertension  Hold her antihypertensives since her blood pressure is borderline low.  She will be given IV fluids as discussed above.  DVT Prophylaxis: Currently on hold given GI bleed as above Code Status: Full code Family Communication: Discussed with the patient  Status Inpatient  - Remains inpatient appropriate because:IV treatments appropriate due to intensity of illness or inability to take PO and Inpatient level of care appropriate due to severity of illness  - Dispo: The patient is from: Home  Anticipated d/c is to: Home  Anticipated d/c date is: > 3 days  Patient currently is not medically stable to d/c.  Consultants:   None  Procedures:   None  Antimicrobials:  Remdesivir   Subjective: No acute issues/events noted overnight, continues to feel somewhat fatigued weak worse with exertion, dyspnea somewhat improving but again worse with exertion, she otherwise declines nausea, vomiting, diarrhea, headache, chest pain, fevers or chills.  Objective: Vitals:  01/09/20 0054 01/09/20 0459 01/09/20 0901 01/09/20 1321  BP: 126/62 130/75 133/79 123/82  Pulse: 91 83  89  Resp: 15 15 16 18   Temp: 98.2 F (36.8 C) 97.6 F (36.4 C) 98.1 F (36.7 C) 98.2 F (36.8 C)  TempSrc: Oral  Oral   SpO2: 92% 92% 93% 92%  Weight:      Height:       No intake or output data in the 24 hours  ending 01/09/20 1530 Filed Weights   01/07/20 1420 01/08/20 2109  Weight: 117.9 kg 119 kg    Examination:  General exam: Appears calm and comfortable  Respiratory system: Diminished breath sounds bilaterally, normal respiratory effort, without accessory muscle use Cardiovascular system: S1 & S2 heard, RRR. No JVD, murmurs, rubs, gallops or clicks. No pedal edema. Gastrointestinal system: Abdomen is nondistended, soft and nontender. No organomegaly or masses felt. Normal bowel sounds heard. Central nervous system: Alert and oriented. No focal neurological deficits. Extremities: Symmetric 5 x 5 power. Skin: No rashes, lesions or ulcers Psychiatry: Judgement and insight appear normal. Mood & affect appropriate.    Data Reviewed: I have personally reviewed following labs and imaging studies  CBC: Recent Labs  Lab 01/07/20 1427 01/08/20 0500 01/09/20 0332  WBC 6.5 4.8 5.8  NEUTROABS 5.5 4.1 4.3  HGB 13.6 12.7 12.5  HCT 41.1 38.2 38.6  MCV 91.7 91.8 92.3  PLT 153 143* 157   Basic Metabolic Panel: Recent Labs  Lab 01/07/20 1427 01/08/20 0500 01/09/20 0332  NA 134* 139 141  K 3.4* 3.9 4.0  CL 99 106 107  CO2 22 23 23   GLUCOSE 151* 162* 149*  BUN 35* 41* 39*  CREATININE 1.55* 1.59* 1.21*  CALCIUM 8.1* 8.1* 8.7*  MG  --  1.9 1.9   GFR: Estimated Creatinine Clearance: 62 mL/min (A) (by C-G formula based on SCr of 1.21 mg/dL (H)). Liver Function Tests: Recent Labs  Lab 01/07/20 1427 01/08/20 0500 01/09/20 0332  AST 47* 50* 45*  ALT 25 25 25   ALKPHOS 64 62 55  BILITOT 0.6 0.4 0.1*  PROT 7.0 6.5 6.4*  ALBUMIN 3.6 3.3* 3.2*   No results for input(s): LIPASE, AMYLASE in the last 168 hours. No results for input(s): AMMONIA in the last 168 hours. Coagulation Profile: No results for input(s): INR, PROTIME in the last 168 hours. Cardiac Enzymes: No results for input(s): CKTOTAL, CKMB, CKMBINDEX, TROPONINI in the last 168 hours. BNP (last 3 results) No results for  input(s): PROBNP in the last 8760 hours. HbA1C: No results for input(s): HGBA1C in the last 72 hours. CBG: No results for input(s): GLUCAP in the last 168 hours. Lipid Profile: Recent Labs    01/07/20 1427  TRIG 166*   Thyroid Function Tests: No results for input(s): TSH, T4TOTAL, FREET4, T3FREE, THYROIDAB in the last 72 hours. Anemia Panel: Recent Labs    01/07/20 1427  FERRITIN 726*   Sepsis Labs: Recent Labs  Lab 01/07/20 1427 01/07/20 1705  PROCALCITON 0.23  --   LATICACIDVEN 1.3 1.0    Recent Results (from the past 240 hour(s))  Blood Culture (routine x 2)     Status: None (Preliminary result)   Collection Time: 01/07/20  2:30 PM   Specimen: BLOOD RIGHT HAND  Result Value Ref Range Status   Specimen Description   Final    BLOOD RIGHT HAND Performed at [facility], [address] [place], [place] [id number]    Special Requests   Final  BOTTLES DRAWN AEROBIC AND ANAEROBIC Blood Culture adequate volume Performed at [facility], [address] [place], [place] [address]    Culture   Final    NO GROWTH 2 DAYS Performed at [facility], [address], [place], [place] [address]    Report Status PENDING  Incomplete  Blood Culture (routine x 2)     Status: None (Preliminary result)   Collection Time: 01/07/20  3:03 PM   Specimen: BLOOD  Result Value Ref Range Status   Specimen Description   Final    BLOOD RIGHT ANTECUBITAL Performed at [facility], [address], [place], [place] [address]    Special Requests   Final    BOTTLES DRAWN AEROBIC AND ANAEROBIC Blood Culture results may not be optimal due to an excessive volume of blood received in culture bottles Performed at [facility], [address], [place], [place] [address]    Culture   Final    NO GROWTH 2 DAYS Performed at [facility], [address], [place], [place] [address]    Report Status PENDING   Incomplete  SARS Coronavirus 2 by RT PCR (hospital order, performed in [facility] hospital lab) Nasopharyngeal Nasopharyngeal Swab     Status: Abnormal   Collection Time: 01/07/20  3:09 PM   Specimen: Nasopharyngeal Swab  Result Value Ref Range Status   SARS Coronavirus 2 POSITIVE (A) NEGATIVE Final    Comment: RESULT CALLED TO, READ BACK BY AND VERIFIED WITH: WEST,S. RN @1816  01/07/20 BILLINGSLEY,L (NOTE) SARS-CoV-2 target nucleic acids are DETECTED  SARS-CoV-2 RNA is generally detectable in upper respiratory specimens  during the acute phase of infection.  Positive results are indicative  of the presence of the identified virus, but do not rule out bacterial infection or co-infection with other pathogens not detected by the test.  Clinical correlation with patient history and  other diagnostic information is necessary to determine patient infection status.  The expected result is negative.  Fact Sheet for Patients:   01/09/20   Fact Sheet for Healthcare Providers:   BoilerBrush.com.cy    This test is not yet approved or cleared by the https://pope.com/ FDA and  has been authorized for detection and/or diagnosis of SARS-CoV-2 by FDA under an Emergency Use Authorization (EUA).  This EUA will remain in effect (meaning thi s test can be used) for the duration of  the COVID-19 declaration under Section 564(b)(1) of the Act, 21 U.S.C. section 360-bbb-3(b)(1), unless the authorization is terminated or revoked sooner.  Performed at [facility], [address], [place], [place]       Radiology Studies: [facility] Chest Port 1 View  Result Date: 01/07/2020 CLINICAL DATA:  Shortness of breath and cough with COVID-19 positivity, initial encounter EXAM: PORTABLE CHEST 1 VIEW COMPARISON:  05/05/2004 FINDINGS: Cardiac shadow is enlarged. Aortic calcifications are noted. The lungs are well aerated bilaterally.  Patchy airspace opacities are noted left greater than right consistent with the given clinical history of COVID-19 positivity. No bony abnormality is noted. IMPRESSION: Patchy airspace opacities consistent with the given clinical history of COVID-19 positivity. Aortic Atherosclerosis (ICD10-I70.0). Electronically Signed   By: 05/07/2004 M.D.   On: 01/07/2020 16:25    Scheduled Meds: . vitamin C  500 mg Oral Daily  . baricitinib  2 mg Oral Daily  . famotidine  20 mg Oral Daily  . mouth rinse  15 mL Mouth Rinse BID  .  methylPREDNISolone (SOLU-MEDROL) injection  60 mg Intravenous Q12H  . oxybutynin  5 mg Oral BID  . pantoprazole (PROTONIX) IV  40 mg Intravenous QHS  . zinc sulfate  220 mg Oral Daily   Continuous Infusions: . remdesivir 100 mg in NS 100 mL 100 mg (01/09/20 1033)     LOS: 2 days   Time spent:  Azucena Fallen, DO [facility]  If 7PM-7AM, please contact night-coverage [email or web address]  01/09/2020, 3:30 PM

## 2020-01-09 NOTE — Progress Notes (Signed)
SATURATION QUALIFICATIONS: (This note is used to comply with regulatory documentation for home oxygen)  Patient Saturations on Room Air at Rest =87%  Patient Saturations on Room Air while Ambulating = 79%  Patient Saturations on 3 Liters of oxygen while Ambulating = 90%  Please briefly explain why patient needs home oxygen:

## 2020-01-10 DIAGNOSIS — I10 Essential (primary) hypertension: Secondary | ICD-10-CM

## 2020-01-10 LAB — CBC WITH DIFFERENTIAL/PLATELET
Abs Immature Granulocytes: 0.07 10*3/uL (ref 0.00–0.07)
Basophils Absolute: 0 10*3/uL (ref 0.0–0.1)
Basophils Relative: 0 %
Eosinophils Absolute: 0 10*3/uL (ref 0.0–0.5)
Eosinophils Relative: 0 %
HCT: 40.6 % (ref 36.0–46.0)
Hemoglobin: 13 g/dL (ref 12.0–15.0)
Immature Granulocytes: 1 %
Lymphocytes Relative: 13 %
Lymphs Abs: 1 10*3/uL (ref 0.7–4.0)
MCH: 30 pg (ref 26.0–34.0)
MCHC: 32 g/dL (ref 30.0–36.0)
MCV: 93.5 fL (ref 80.0–100.0)
Monocytes Absolute: 0.7 10*3/uL (ref 0.1–1.0)
Monocytes Relative: 9 %
Neutro Abs: 5.6 10*3/uL (ref 1.7–7.7)
Neutrophils Relative %: 77 %
Platelets: 226 10*3/uL (ref 150–400)
RBC: 4.34 MIL/uL (ref 3.87–5.11)
RDW: 14.9 % (ref 11.5–15.5)
WBC: 7.3 10*3/uL (ref 4.0–10.5)
nRBC: 0 % (ref 0.0–0.2)

## 2020-01-10 LAB — C-REACTIVE PROTEIN: CRP: 2.2 mg/dL — ABNORMAL HIGH (ref <1.0)

## 2020-01-10 LAB — COMPREHENSIVE METABOLIC PANEL
ALT: 45 U/L — ABNORMAL HIGH (ref 0–44)
AST: 73 U/L — ABNORMAL HIGH (ref 15–41)
Albumin: 3.5 g/dL (ref 3.5–5.0)
Alkaline Phosphatase: 62 U/L (ref 38–126)
Anion gap: 12 (ref 5–15)
BUN: 41 mg/dL — ABNORMAL HIGH (ref 8–23)
CO2: 24 mmol/L (ref 22–32)
Calcium: 9.2 mg/dL (ref 8.9–10.3)
Chloride: 107 mmol/L (ref 98–111)
Creatinine, Ser: 1.23 mg/dL — ABNORMAL HIGH (ref 0.44–1.00)
GFR calc Af Amer: 55 mL/min — ABNORMAL LOW (ref >60)
GFR calc non Af Amer: 47 mL/min — ABNORMAL LOW (ref >60)
Glucose, Bld: 151 mg/dL — ABNORMAL HIGH (ref 70–99)
Potassium: 4.2 mmol/L (ref 3.5–5.1)
Sodium: 143 mmol/L (ref 135–145)
Total Bilirubin: 0.6 mg/dL (ref 0.3–1.2)
Total Protein: 6.8 g/dL (ref 6.5–8.1)

## 2020-01-10 LAB — MAGNESIUM: Magnesium: 2.2 mg/dL (ref 1.7–2.4)

## 2020-01-10 LAB — D-DIMER, QUANTITATIVE: D-Dimer, Quant: 1.06 ug/mL-FEU — ABNORMAL HIGH (ref 0.00–0.50)

## 2020-01-10 MED ORDER — PREDNISONE 10 MG PO TABS: ORAL_TABLET | ORAL | 0 refills | Status: AC

## 2020-01-10 NOTE — Progress Notes (Signed)
Pt advised that she wants to go home with or without approval from doctor. MD notified that pt requesting to leave AMA. Pt states that she is read to go because she has a family who all have covid at home and the "bed and chair are not comfortable to sleep in". MD ordered repeat home O2 eval on 4L for O2 qualifications. Pt remained at 90% on RA while at rest but desat to 86% while ambulating requiring 4L to return to 88%. Pt maintained 91% on 4L while ambulating and resting. MD notified and discharge order placed. Pt notified that per MD she requires O2 for discharge and if unable to have case management deliver O2 to bedside tonight she will need to stay another night to get O2. Pt stated regardless she is leaving tonight. RN and charge RN educated pt that leaving AMA she will not be eligible to get O2, medications or referrals. Pt stated she understood and requested to still leave AMA if O2 is not delivered tonight. MD notified and oncoming night RN advised. Pt is in room getting dressed and has called ride. Awaiting call from case management on time of O2 delivery.

## 2020-01-10 NOTE — Progress Notes (Signed)
DC home with husband who brought the O2, He is also Covid +. Pt understands dc instructions.

## 2020-01-10 NOTE — Discharge Summary (Signed)
Physician Discharge Summary  Julie Mcguire YNW:[id number] DOB: 1956-02-27 DOA: 01/07/2020  PCP: Elias Else, MD  Admit date: 01/07/2020  Discharge date: 01/10/2020  Admitted From: Home Disposition: Home  Recommendations for Outpatient Follow-up:  1. Follow up with PCP in 1-2 weeks 2. Please obtain BMP/CBC in one week 3. Please follow up on the following pending results:  Home Health: None Equipment/Devices: Oxygen, 4 L  Discharge Condition: Stable CODE STATUS: Full Diet recommendation: Regular diet, advance as tolerated  Brief/Interim Summary: Julian Reil a 64 y.o.femalewith a past medical history of hypertension who was exposed to her daughter who was sick with Covid last week. Patient's symptoms started on Saturday consisting of body aches fatigue headache. Denies any GI symptoms such as nausea vomiting or diarrhea. Subsequently she developeda cough and also had shortness of breath. Initially with exertion subsequently at rest. She decided to come to the hospital for evaluation. She has not been vaccinated against COVID-19. Denies any chest pain. Had cough with clear expectoration. In the emergency department patient was found to have oxygen saturation in the 80s. She was placed on oxygen. Evaluation revealed pneumonia. Inflammatory markers were elevated. Patient was hospitalized for further management.  Patient admitted as above with acute hypoxic respiratory failure in setting of COVID-19 pneumonia.  Initially having mostly GI symptoms and body aches she ultimately had worsening respiratory status.  In the hospital she was placed on oxygen to maintain saturations, she was treated with Remdesivir, steroids as well as baricitinib with markedly improving labs, symptoms and hypoxia.  At this point patient is able to ambulate on 4 L nasal cannula and maintain sats above 90%.  She is otherwise stable and agreeable for discharge home.  Patient has completed Remdesivir  course but will continue steroid taper at discharge.  Would recommend close follow-up with PCP, tele visit would certainly be reasonable, to continue to wean oxygen as tolerated.  Lengthy discussion at bedside about need for further quarantining, she will need to remain quarantined at home away from Covid negative family members for 21 days per CDC guidelines.  Lengthy discussion that patient should also return back to the hospital should she have worsening respiratory status, chest pain or dyspnea with exertion.  Discharge Diagnoses:  Principal Problem:   Acute respiratory failure (HCC) Active Problems:   Pneumonia due to COVID-19 virus   Essential hypertension   ARF (acute renal failure) (HCC)   Obesity, Class III, BMI 40-49.9 (morbid obesity) (HCC)   Hyponatremia    Discharge Instructions  Discharge Instructions    Call MD for:  difficulty breathing, headache or visual disturbances   Complete by: As directed    Call MD for:  persistant dizziness or light-headedness   Complete by: As directed    Diet - low sodium heart healthy   Complete by: As directed    Discharge instructions   Complete by: As directed    ?   Person Under Monitoring Name: ELIZBETH POSA  Location: [address] [place] [address]   Infection Prevention Recommendations for Individuals Confirmed to have, or Being Evaluated for, 2019 Novel Coronavirus (COVID-19) Infection Who Receive Care at Home  Individuals who are confirmed to have, or are being evaluated for, COVID-19 should follow the prevention steps below until a healthcare provider or local or state health department says they can return to normal activities.  Stay home except to get medical care You should restrict activities outside your home, except for getting medical care.  Do not go to work, school, or public areas, and do not use public transportation or taxis.  Call ahead before visiting your doctor Before your medical  appointment, call the healthcare provider and tell them that you have, or are being evaluated for, COVID-19 infection. This will help the healthcare provider's office take steps to keep other people from getting infected. Ask your healthcare provider to call the local or state health department.  Monitor your symptoms Seek prompt medical attention if your illness is worsening (e.g., difficulty breathing). Before going to your medical appointment, call the healthcare provider and tell them that you have, or are being evaluated for, COVID-19 infection. Ask your healthcare provider to call the local or state health department.  Wear a facemask You should wear a facemask that covers your nose and mouth when you are in the same room with other people and when you visit a healthcare provider. People who live with or visit you should also wear a facemask while they are in the same room with you.  Separate yourself from other people in your home As much as possible, you should stay in a different room from other people in your home. Also, you should use a separate bathroom, if available.  Avoid sharing household items You should not share dishes, drinking glasses, cups, eating utensils, towels, bedding, or other items with other people in your home. After using these items, you should wash them thoroughly with soap and water.  Cover your coughs and sneezes Cover your mouth and nose with a tissue when you cough or sneeze, or you can cough or sneeze into your sleeve. Throw used tissues in a lined trash can, and immediately wash your hands with soap and water for at least 20 seconds or use an alcohol-based hand rub.  Wash your [employer] your hands often and thoroughly with soap and water for at least 20 seconds. You can use an alcohol-based hand sanitizer if soap and water are not available and if your hands are not visibly dirty. Avoid touching your eyes, nose, and mouth with unwashed  hands.   Prevention Steps for Caregivers and Household Members of Individuals Confirmed to have, or Being Evaluated for, COVID-19 Infection Being Cared for in the Home  If you live with, or provide care at home for, a person confirmed to have, or being evaluated for, COVID-19 infection please follow these guidelines to prevent infection:  Follow healthcare provider's instructions Make sure that you understand and can help the patient follow any healthcare provider instructions for all care.  Provide for the patient's basic needs You should help the patient with basic needs in the home and provide support for getting groceries, prescriptions, and other personal needs.  Monitor the patient's symptoms If they are getting sicker, call his or her medical provider and tell them that the patient has, or is being evaluated for, COVID-19 infection. This will help the healthcare provider's office take steps to keep other people from getting infected. Ask the healthcare provider to call the local or state health department.  Limit the number of people who have contact with the patient If possible, have only one caregiver for the patient. Other household members should stay in another home or place of residence. If this is not possible, they should stay in another room, or be separated from the patient as much as possible. Use a separate bathroom, if available. Restrict visitors who do not have an essential need to be in the home.  Keep older adults, very young children, and other sick people away from the patient Keep older adults, very young children, and those who have compromised immune systems or chronic health conditions away from the patient. This includes people with chronic heart, lung, or kidney conditions, diabetes, and cancer.  Ensure good ventilation Make sure that shared spaces in the home have good air flow, such as from an air conditioner or an opened window, weather  permitting.  Wash your hands often Wash your hands often and thoroughly with soap and water for at least 20 seconds. You can use an alcohol based hand sanitizer if soap and water are not available and if your hands are not visibly dirty. Avoid touching your eyes, nose, and mouth with unwashed hands. Use disposable paper towels to dry your hands. If not available, use dedicated cloth towels and replace them when they become wet.  Wear a facemask and gloves Wear a disposable facemask at all times in the room and gloves when you touch or have contact with the patient's blood, body fluids, and/or secretions or excretions, such as sweat, saliva, sputum, nasal mucus, vomit, urine, or feces.  Ensure the mask fits over your nose and mouth tightly, and do not touch it during use. Throw out disposable facemasks and gloves after using them. Do not reuse. Wash your hands immediately after removing your facemask and gloves. If your personal clothing becomes contaminated, carefully remove clothing and launder. Wash your hands after handling contaminated clothing. Place all used disposable facemasks, gloves, and other waste in a lined container before disposing them with other household waste. Remove gloves and wash your hands immediately after handling these items.  Do not share dishes, glasses, or other household items with the patient Avoid sharing household items. You should not share dishes, drinking glasses, cups, eating utensils, towels, bedding, or other items with a patient who is confirmed to have, or being evaluated for, COVID-19 infection. After the person uses these items, you should wash them thoroughly with soap and water.  Wash laundry thoroughly Immediately remove and wash clothes or bedding that have blood, body fluids, and/or secretions or excretions, such as sweat, saliva, sputum, nasal mucus, vomit, urine, or feces, on them. Wear gloves when handling laundry from the patient. Read and  follow directions on labels of laundry or clothing items and detergent. In general, wash and dry with the warmest temperatures recommended on the label.  Clean all areas the individual has used often Clean all touchable surfaces, such as counters, tabletops, doorknobs, bathroom fixtures, toilets, phones, keyboards, tablets, and bedside tables, every day. Also, clean any surfaces that may have blood, body fluids, and/or secretions or excretions on them. Wear gloves when cleaning surfaces the patient has come in contact with. Use a diluted bleach solution (e.g., dilute bleach with 1 part bleach and 10 parts water) or a household disinfectant with a label that says EPA-registered for coronaviruses. To make a bleach solution at home, add 1 tablespoon of bleach to 1 quart (4 cups) of water. For a larger supply, add  cup of bleach to 1 gallon (16 cups) of water. Read labels of cleaning products and follow recommendations provided on product labels. Labels contain instructions for safe and effective use of the cleaning product including precautions you should take when applying the product, such as wearing gloves or eye protection and making sure you have good ventilation during use of the product. Remove gloves and wash hands immediately after cleaning.  Monitor yourself for signs  and symptoms of illness Caregivers and household members are considered close contacts, should monitor their health, and will be asked to limit movement outside of the home to the extent possible. Follow the monitoring steps for close contacts listed on the symptom monitoring form.   ? If you have additional questions, contact your local health department or call the epidemiologist on call at [phone] (available 24/7). ? This guidance is subject to change. For the most up-to-date guidance from [facility], please refer to their website: [email or web address]   Increase activity  slowly   Complete by: As directed    No wound care   Complete by: As directed      Allergies as of 01/10/2020      Reactions   Codeine Nausea And Vomiting      Medication List    STOP taking these medications   aspirin 81 MG chewable tablet   diclofenac 75 MG EC tablet Commonly known as: VOLTAREN   oxyCODONE 5 MG immediate release tablet Commonly known as: Oxy IR/ROXICODONE     TAKE these medications   acetaminophen 500 MG tablet Commonly known as: TYLENOL Take 1,000 mg by mouth as needed for moderate pain or headache.   amLODipine 10 MG tablet Commonly known as: NORVASC Take 10 mg by mouth daily.   methocarbamol 500 MG tablet Commonly known as: ROBAXIN Take 1 tablet (500 mg total) by mouth every 6 (six) hours as needed for muscle spasms.   metoprolol succinate 100 MG 24 hr tablet Commonly known as: TOPROL-XL Take 100 mg by mouth daily.   oxybutynin 5 MG tablet Commonly known as: DITROPAN Take 5 mg by mouth 2 (two) times daily.   predniSONE 10 MG tablet Commonly known as: DELTASONE Take 4 tablets (40 mg total) by mouth daily for 3 days, THEN 3 tablets (30 mg total) daily for 3 days, THEN 2 tablets (20 mg total) daily for 3 days, THEN 1 tablet (10 mg total) daily for 3 days. Start taking on: January 10, 2020   valsartan-hydrochlorothiazide 320-25 MG tablet Commonly known as: DIOVAN-HCT Take 1 tablet by mouth daily.            Durable Medical Equipment  (From admission, onward)         Start     Ordered   01/10/20 1816  DME Oxygen  Once       Question Answer Comment  Length of Need 6 Months   Mode or (Route) Nasal cannula   Liters per Minute 4   Frequency Continuous (stationary and portable oxygen unit needed)   Oxygen conserving device No   Oxygen delivery system Gas      01/10/20 1816          Allergies  Allergen Reactions  . Codeine Nausea And Vomiting    Consultations: None  Procedures/Studies: DG Chest Port 1 View  Result  Date: 01/07/2020 CLINICAL DATA:  Shortness of breath and cough with COVID-19 positivity, initial encounter EXAM: PORTABLE CHEST 1 VIEW COMPARISON:  05/05/2004 FINDINGS: Cardiac shadow is enlarged. Aortic calcifications are noted. The lungs are well aerated bilaterally. Patchy airspace opacities are noted left greater than right consistent with the given clinical history of COVID-19 positivity. No bony abnormality is noted. IMPRESSION: Patchy airspace opacities consistent with the given clinical history of COVID-19 positivity. Aortic Atherosclerosis (ICD10-I70.0). Electronically Signed   By: Alcide Clever M.D.   On: 01/07/2020 16:25      Subjective: No acute issues or events overnight, patient does have ongoing  dyspnea with exertion but markedly improving from previous.  She overtly denies nausea, vomiting, diarrhea, constipation, headache, fevers, chills.   Discharge Exam: Vitals:   01/10/20 0342 01/10/20 1502  BP: 123/78 138/83  Pulse: 93 98  Resp: 20 18  Temp: 97.6 F (36.4 C) 98.1 F (36.7 C)  SpO2: 93% 94%   Vitals:   01/09/20 1321 01/09/20 2024 01/10/20 0342 01/10/20 1502  BP: 123/82 (!) 150/83 123/78 138/83  Pulse: 89 (!) 108 93 98  Resp: 18 20 20 18   Temp: 98.2 F (36.8 C) 98.4 F (36.9 C) 97.6 F (36.4 C) 98.1 F (36.7 C)  TempSrc: Oral Oral Oral Oral  SpO2: 92% 93% 93% 94%  Weight:      Height:        General: Pt is alert, awake, not in acute distress Cardiovascular: RRR, S1/S2 +, no rubs, no gallops Respiratory: CTA bilaterally, no wheezing, no rhonchi Abdominal: Soft, NT, ND, bowel sounds + Extremities: no edema, no cyanosis    The results of significant diagnostics from this hospitalization (including imaging, microbiology, ancillary and laboratory) are listed below for reference.     Microbiology: Recent Results (from the past 240 hour(s))  Blood Culture (routine x 2)     Status: None (Preliminary result)   Collection Time: 01/07/20  2:30 PM   Specimen:  BLOOD RIGHT HAND  Result Value Ref Range Status   Specimen Description   Final    BLOOD RIGHT HAND Performed at [facility], [address], [place], [place]    Special Requests   Final    BOTTLES DRAWN AEROBIC AND ANAEROBIC Blood Culture adequate volume Performed at [facility], [address], [place], [place]    Culture   Final    NO GROWTH 3 DAYS Performed at [facility], [address] [place], [place]    Report Status PENDING  Incomplete  Blood Culture (routine x 2)     Status: None (Preliminary result)   Collection Time: 01/07/20  3:03 PM   Specimen: BLOOD  Result Value Ref Range Status   Specimen Description   Final    BLOOD RIGHT ANTECUBITAL Performed at [facility], [address], [place], [place]    Special Requests   Final    BOTTLES DRAWN AEROBIC AND ANAEROBIC Blood Culture results may not be optimal due to an excessive volume of blood received in culture bottles Performed at [facility], [address] [place], [place]    Culture   Final    NO GROWTH 3 DAYS Performed at [facility], [address] [place], [place]    Report Status PENDING  Incomplete  SARS Coronavirus 2 by RT PCR (hospital order, performed in [facility] hospital lab) Nasopharyngeal Nasopharyngeal Swab     Status: Abnormal   Collection Time: 01/07/20  3:09 PM   Specimen: Nasopharyngeal Swab  Result Value Ref Range Status   SARS Coronavirus 2 POSITIVE (A) NEGATIVE Final    Comment: RESULT CALLED TO, READ BACK BY AND VERIFIED WITH: WEST,S. RN @1816  01/07/20 BILLINGSLEY,L (NOTE) SARS-CoV-2 target nucleic acids are DETECTED  SARS-CoV-2 RNA is generally detectable in upper respiratory specimens  during the acute phase of infection.  Positive results are indicative  of the presence of the identified virus, but do not rule out bacterial  infection or co-infection with other pathogens not detected by the test.  Clinical  correlation with patient history and  other diagnostic information is necessary to determine patient infection status.  The expected result is negative.  Fact Sheet for Patients:   BoilerBrush.com.cyhttps://www.fda.gov/media/136312/download   Fact Sheet for Healthcare Providers:   https://pope.com/https://www.fda.gov/media/136313/download    This test is not yet approved or cleared by the [place] FDA and  has been authorized for detection and/or diagnosis of SARS-CoV-2 by FDA under an Emergency Use Authorization (EUA).  This EUA will remain in effect (meaning thi s test can be used) for the duration of  the COVID-19 declaration under Section 564(b)(1) of the Act, 21 U.S.C. section 360-bbb-3(b)(1), unless the authorization is terminated or revoked sooner.  Performed at [facility], [address] [place], [place] [id number]      Labs: BNP (last 3 results) No results for input(s): BNP in the last 8760 hours. Basic Metabolic Panel: Recent Labs  Lab 01/07/20 1427 01/08/20 0500 01/09/20 0332 01/10/20 0341  NA 134* 139 141 143  K 3.4* 3.9 4.0 4.2  CL 99 106 107 107  CO2 22 23 23 24   GLUCOSE 151* 162* 149* 151*  BUN 35* 41* 39* 41*  CREATININE 1.55* 1.59* 1.21* 1.23*  CALCIUM 8.1* 8.1* 8.7* 9.2  MG  --  1.9 1.9 2.2   Liver Function Tests: Recent Labs  Lab 01/07/20 1427 01/08/20 0500 01/09/20 0332 01/10/20 0341  AST 47* 50* 45* 73*  ALT 25 25 25  45*  ALKPHOS 64 62 55 62  BILITOT 0.6 0.4 0.1* 0.6  PROT 7.0 6.5 6.4* 6.8  ALBUMIN 3.6 3.3* 3.2* 3.5   No results for input(s): LIPASE, AMYLASE in the last 168 hours. No results for input(s): AMMONIA in the last 168 hours. CBC: Recent Labs  Lab 01/07/20 1427 01/08/20 0500 01/09/20 0332 01/10/20 0341  WBC 6.5 4.8 5.8 7.3  NEUTROABS 5.5 4.1 4.3 5.6  HGB 13.6 12.7 12.5 13.0  HCT 41.1 38.2 38.6 40.6  MCV 91.7 91.8 92.3 93.5  PLT 153 143* 157  226   Cardiac Enzymes: No results for input(s): CKTOTAL, CKMB, CKMBINDEX, TROPONINI in the last 168 hours. BNP: Invalid input(s): POCBNP CBG: No results for input(s): GLUCAP in the last 168 hours. D-Dimer Recent Labs    01/09/20 0332 01/10/20 0341  DDIMER 1.59* 1.06*   Hgb A1c No results for input(s): HGBA1C in the last 72 hours. Lipid Profile No results for input(s): CHOL, HDL, LDLCALC, TRIG, CHOLHDL, LDLDIRECT in the last 72 hours. Thyroid function studies No results for input(s): TSH, T4TOTAL, T3FREE, THYROIDAB in the last 72 hours.  Invalid input(s): FREET3 Anemia work up No results for input(s): VITAMINB12, FOLATE, FERRITIN, TIBC, IRON, RETICCTPCT in the last 72 hours. Urinalysis    Component Value Date/Time   COLORURINE YELLOW 01/07/2020 1819   APPEARANCEUR TURBID (A) 01/07/2020 1819   LABSPEC 1.018 01/07/2020 1819   PHURINE 5.0 01/07/2020 1819   GLUCOSEU NEGATIVE 01/07/2020 1819   HGBUR MODERATE (A) 01/07/2020 1819   BILIRUBINUR NEGATIVE 01/07/2020 1819   KETONESUR NEGATIVE 01/07/2020 1819   PROTEINUR >=300 (A) 01/07/2020 1819   NITRITE POSITIVE (A) 01/07/2020 1819   LEUKOCYTESUR TRACE (A) 01/07/2020 1819   Sepsis Labs Invalid input(s): PROCALCITONIN,  WBC,  LACTICIDVEN Microbiology Recent Results (from the past 240 hour(s))  Blood Culture (routine x 2)     Status: None (Preliminary result)   Collection Time: 01/07/20  2:30 PM   Specimen: BLOOD RIGHT HAND  Result Value Ref Range Status   Specimen Description   Final  BLOOD RIGHT HAND Performed at [facility], [address] [place], [place] [address]    Special Requests   Final    BOTTLES DRAWN AEROBIC AND ANAEROBIC Blood Culture adequate volume Performed at [facility], [address] [place], [place] [address]    Culture   Final    NO GROWTH 3 DAYS Performed at [facility], [address], [place], [place] [address]    Report Status PENDING   Incomplete  Blood Culture (routine x 2)     Status: None (Preliminary result)   Collection Time: 01/07/20  3:03 PM   Specimen: BLOOD  Result Value Ref Range Status   Specimen Description   Final    BLOOD RIGHT ANTECUBITAL Performed at [facility], [address], [place], [place] [address]    Special Requests   Final    BOTTLES DRAWN AEROBIC AND ANAEROBIC Blood Culture results may not be optimal due to an excessive volume of blood received in culture bottles Performed at [facility], [address], [place], [place] [address]    Culture   Final    NO GROWTH 3 DAYS Performed at [facility], [address] [place], [place] [address]    Report Status PENDING  Incomplete  SARS Coronavirus 2 by RT PCR (hospital order, performed in [facility] hospital lab) Nasopharyngeal Nasopharyngeal Swab     Status: Abnormal   Collection Time: 01/07/20  3:09 PM   Specimen: Nasopharyngeal Swab  Result Value Ref Range Status   SARS Coronavirus 2 POSITIVE (A) NEGATIVE Final    Comment: RESULT CALLED TO, READ BACK BY AND VERIFIED WITH: WEST,S. RN @1816  01/07/20 BILLINGSLEY,L (NOTE) SARS-CoV-2 target nucleic acids are DETECTED  SARS-CoV-2 RNA is generally detectable in upper respiratory specimens  during the acute phase of infection.  Positive results are indicative  of the presence of the identified virus, but do not rule out bacterial infection or co-infection with other pathogens not detected by the test.  Clinical correlation with patient history and  other diagnostic information is necessary to determine patient infection status.  The expected result is negative.  Fact Sheet for Patients:   01/09/20   Fact Sheet for Healthcare Providers:   BoilerBrush.com.cy    This test is not yet approved or cleared by the https://pope.com/ FDA and  has been authorized for detection and/or diagnosis of  SARS-CoV-2 by FDA under an Emergency Use Authorization (EUA).  This EUA will remain in effect (meaning thi s test can be used) for the duration of  the COVID-19 declaration under Section 564(b)(1) of the Act, 21 U.S.C. section 360-bbb-3(b)(1), unless the authorization is terminated or revoked sooner.  Performed at [facility], [address], [place], [place]      Time coordinating discharge: Over 30 minutes  SIGNED:   [id number], DO [facility] 01/10/2020, 6:16 PM Pager   If 7PM-7AM, please contact night-coverage [email or web address]

## 2020-01-10 NOTE — Progress Notes (Signed)
TOC CM spoke pt and states husband is at home to receive oxygen concentrator. Contacted [employer]. Faxed paperwork for oxygen. They will deliver portable and husband will bring with him when he picks pt up. Isidoro Donning RN CCM, [facility] TOC CM [phone]

## 2020-01-10 NOTE — Progress Notes (Signed)
SATURATION QUALIFICATIONS: (This note is used to comply with regulatory documentation for home oxygen)  Patient Saturations on Room Air at Rest =87%  Patient Saturations on Room Air while Ambulating = 79%  Patient Saturations on 3 Liters of oxygen while Ambulating = 84%  Please briefly explain why patient needs home oxygen: Pt also noted to be tachycardic HR in 130's when desats with activity.

## 2020-01-12 LAB — CULTURE, BLOOD (ROUTINE X 2)
Culture: NO GROWTH
Culture: NO GROWTH
Special Requests: ADEQUATE

## 2020-01-14 ENCOUNTER — Emergency Department (HOSPITAL_COMMUNITY): Payer: BC Managed Care – PPO

## 2020-01-14 ENCOUNTER — Inpatient Hospital Stay (HOSPITAL_COMMUNITY)
Admission: EM | Admit: 2020-01-14 | Discharge: 2020-02-14 | DRG: 871 | Disposition: E | Payer: BC Managed Care – PPO | Attending: Internal Medicine | Admitting: Internal Medicine

## 2020-01-14 ENCOUNTER — Encounter (HOSPITAL_COMMUNITY): Payer: Self-pay

## 2020-01-14 DIAGNOSIS — N179 Acute kidney failure, unspecified: Secondary | ICD-10-CM | POA: Diagnosis not present

## 2020-01-14 DIAGNOSIS — Z87891 Personal history of nicotine dependence: Secondary | ICD-10-CM

## 2020-01-14 DIAGNOSIS — B957 Other staphylococcus as the cause of diseases classified elsewhere: Secondary | ICD-10-CM | POA: Diagnosis not present

## 2020-01-14 DIAGNOSIS — D539 Nutritional anemia, unspecified: Secondary | ICD-10-CM | POA: Diagnosis not present

## 2020-01-14 DIAGNOSIS — Z66 Do not resuscitate: Secondary | ICD-10-CM | POA: Diagnosis not present

## 2020-01-14 DIAGNOSIS — D649 Anemia, unspecified: Secondary | ICD-10-CM | POA: Diagnosis present

## 2020-01-14 DIAGNOSIS — R6510 Systemic inflammatory response syndrome (SIRS) of non-infectious origin without acute organ dysfunction: Secondary | ICD-10-CM | POA: Diagnosis not present

## 2020-01-14 DIAGNOSIS — R1310 Dysphagia, unspecified: Secondary | ICD-10-CM | POA: Diagnosis present

## 2020-01-14 DIAGNOSIS — I4892 Unspecified atrial flutter: Secondary | ICD-10-CM | POA: Diagnosis not present

## 2020-01-14 DIAGNOSIS — R0600 Dyspnea, unspecified: Secondary | ICD-10-CM

## 2020-01-14 DIAGNOSIS — J80 Acute respiratory distress syndrome: Secondary | ICD-10-CM | POA: Diagnosis present

## 2020-01-14 DIAGNOSIS — I10 Essential (primary) hypertension: Secondary | ICD-10-CM | POA: Diagnosis present

## 2020-01-14 DIAGNOSIS — A4189 Other specified sepsis: Secondary | ICD-10-CM | POA: Diagnosis present

## 2020-01-14 DIAGNOSIS — K30 Functional dyspepsia: Secondary | ICD-10-CM | POA: Diagnosis present

## 2020-01-14 DIAGNOSIS — E46 Unspecified protein-calorie malnutrition: Secondary | ICD-10-CM | POA: Diagnosis present

## 2020-01-14 DIAGNOSIS — J159 Unspecified bacterial pneumonia: Secondary | ICD-10-CM | POA: Diagnosis present

## 2020-01-14 DIAGNOSIS — G9341 Metabolic encephalopathy: Secondary | ICD-10-CM | POA: Diagnosis not present

## 2020-01-14 DIAGNOSIS — I4891 Unspecified atrial fibrillation: Secondary | ICD-10-CM | POA: Diagnosis present

## 2020-01-14 DIAGNOSIS — R000 Tachycardia, unspecified: Secondary | ICD-10-CM | POA: Diagnosis present

## 2020-01-14 DIAGNOSIS — Z7189 Other specified counseling: Secondary | ICD-10-CM

## 2020-01-14 DIAGNOSIS — G92 Toxic encephalopathy: Secondary | ICD-10-CM | POA: Diagnosis not present

## 2020-01-14 DIAGNOSIS — F419 Anxiety disorder, unspecified: Secondary | ICD-10-CM | POA: Diagnosis present

## 2020-01-14 DIAGNOSIS — Z96642 Presence of left artificial hip joint: Secondary | ICD-10-CM | POA: Diagnosis present

## 2020-01-14 DIAGNOSIS — R040 Epistaxis: Secondary | ICD-10-CM | POA: Diagnosis not present

## 2020-01-14 DIAGNOSIS — D6959 Other secondary thrombocytopenia: Secondary | ICD-10-CM | POA: Diagnosis not present

## 2020-01-14 DIAGNOSIS — R0603 Acute respiratory distress: Secondary | ICD-10-CM

## 2020-01-14 DIAGNOSIS — Z79899 Other long term (current) drug therapy: Secondary | ICD-10-CM | POA: Diagnosis not present

## 2020-01-14 DIAGNOSIS — I110 Hypertensive heart disease with heart failure: Secondary | ICD-10-CM | POA: Diagnosis present

## 2020-01-14 DIAGNOSIS — E876 Hypokalemia: Secondary | ICD-10-CM | POA: Diagnosis not present

## 2020-01-14 DIAGNOSIS — E441 Mild protein-calorie malnutrition: Secondary | ICD-10-CM | POA: Diagnosis not present

## 2020-01-14 DIAGNOSIS — Z515 Encounter for palliative care: Secondary | ICD-10-CM | POA: Diagnosis not present

## 2020-01-14 DIAGNOSIS — Z781 Physical restraint status: Secondary | ICD-10-CM

## 2020-01-14 DIAGNOSIS — D696 Thrombocytopenia, unspecified: Secondary | ICD-10-CM | POA: Diagnosis present

## 2020-01-14 DIAGNOSIS — R7401 Elevation of levels of liver transaminase levels: Secondary | ICD-10-CM | POA: Diagnosis not present

## 2020-01-14 DIAGNOSIS — E870 Hyperosmolality and hypernatremia: Secondary | ICD-10-CM | POA: Diagnosis present

## 2020-01-14 DIAGNOSIS — F05 Delirium due to known physiological condition: Secondary | ICD-10-CM | POA: Diagnosis not present

## 2020-01-14 DIAGNOSIS — U071 COVID-19: Secondary | ICD-10-CM | POA: Diagnosis present

## 2020-01-14 DIAGNOSIS — J9601 Acute respiratory failure with hypoxia: Secondary | ICD-10-CM | POA: Diagnosis present

## 2020-01-14 DIAGNOSIS — T380X5A Adverse effect of glucocorticoids and synthetic analogues, initial encounter: Secondary | ICD-10-CM | POA: Diagnosis not present

## 2020-01-14 DIAGNOSIS — R739 Hyperglycemia, unspecified: Secondary | ICD-10-CM | POA: Diagnosis not present

## 2020-01-14 DIAGNOSIS — I361 Nonrheumatic tricuspid (valve) insufficiency: Secondary | ICD-10-CM | POA: Diagnosis not present

## 2020-01-14 DIAGNOSIS — I5031 Acute diastolic (congestive) heart failure: Secondary | ICD-10-CM | POA: Diagnosis not present

## 2020-01-14 DIAGNOSIS — R7989 Other specified abnormal findings of blood chemistry: Secondary | ICD-10-CM | POA: Diagnosis not present

## 2020-01-14 DIAGNOSIS — J189 Pneumonia, unspecified organism: Secondary | ICD-10-CM

## 2020-01-14 DIAGNOSIS — Z90710 Acquired absence of both cervix and uterus: Secondary | ICD-10-CM

## 2020-01-14 DIAGNOSIS — Z6841 Body Mass Index (BMI) 40.0 and over, adult: Secondary | ICD-10-CM | POA: Diagnosis not present

## 2020-01-14 DIAGNOSIS — Z4659 Encounter for fitting and adjustment of other gastrointestinal appliance and device: Secondary | ICD-10-CM

## 2020-01-14 DIAGNOSIS — I480 Paroxysmal atrial fibrillation: Secondary | ICD-10-CM | POA: Diagnosis not present

## 2020-01-14 DIAGNOSIS — Z9049 Acquired absence of other specified parts of digestive tract: Secondary | ICD-10-CM | POA: Diagnosis not present

## 2020-01-14 DIAGNOSIS — R0602 Shortness of breath: Secondary | ICD-10-CM

## 2020-01-14 DIAGNOSIS — K449 Diaphragmatic hernia without obstruction or gangrene: Secondary | ICD-10-CM | POA: Diagnosis present

## 2020-01-14 DIAGNOSIS — R4182 Altered mental status, unspecified: Secondary | ICD-10-CM | POA: Diagnosis not present

## 2020-01-14 DIAGNOSIS — Z0189 Encounter for other specified special examinations: Secondary | ICD-10-CM

## 2020-01-14 DIAGNOSIS — R799 Abnormal finding of blood chemistry, unspecified: Secondary | ICD-10-CM | POA: Diagnosis present

## 2020-01-14 DIAGNOSIS — D72828 Other elevated white blood cell count: Secondary | ICD-10-CM | POA: Diagnosis not present

## 2020-01-14 DIAGNOSIS — N390 Urinary tract infection, site not specified: Secondary | ICD-10-CM | POA: Diagnosis not present

## 2020-01-14 DIAGNOSIS — J1282 Pneumonia due to coronavirus disease 2019: Secondary | ICD-10-CM | POA: Diagnosis present

## 2020-01-14 DIAGNOSIS — R410 Disorientation, unspecified: Secondary | ICD-10-CM | POA: Diagnosis present

## 2020-01-14 DIAGNOSIS — G9340 Encephalopathy, unspecified: Secondary | ICD-10-CM | POA: Diagnosis not present

## 2020-01-14 LAB — CBC WITH DIFFERENTIAL/PLATELET
Abs Immature Granulocytes: 1.53 10*3/uL — ABNORMAL HIGH (ref 0.00–0.07)
Basophils Absolute: 0.1 10*3/uL (ref 0.0–0.1)
Basophils Relative: 1 %
Eosinophils Absolute: 0 10*3/uL (ref 0.0–0.5)
Eosinophils Relative: 0 %
HCT: 39.9 % (ref 36.0–46.0)
Hemoglobin: 13 g/dL (ref 12.0–15.0)
Immature Granulocytes: 8 %
Lymphocytes Relative: 3 %
Lymphs Abs: 0.6 10*3/uL — ABNORMAL LOW (ref 0.7–4.0)
MCH: 30.4 pg (ref 26.0–34.0)
MCHC: 32.6 g/dL (ref 30.0–36.0)
MCV: 93.2 fL (ref 80.0–100.0)
Monocytes Absolute: 1.5 10*3/uL — ABNORMAL HIGH (ref 0.1–1.0)
Monocytes Relative: 7 %
Neutro Abs: 16.4 10*3/uL — ABNORMAL HIGH (ref 1.7–7.7)
Neutrophils Relative %: 81 %
Platelets: 467 10*3/uL — ABNORMAL HIGH (ref 150–400)
RBC: 4.28 MIL/uL (ref 3.87–5.11)
RDW: 14.6 % (ref 11.5–15.5)
WBC: 20.1 10*3/uL — ABNORMAL HIGH (ref 4.0–10.5)
nRBC: 0.1 % (ref 0.0–0.2)

## 2020-01-14 LAB — COMPREHENSIVE METABOLIC PANEL
ALT: 41 U/L (ref 0–44)
AST: 29 U/L (ref 15–41)
Albumin: 3.1 g/dL — ABNORMAL LOW (ref 3.5–5.0)
Alkaline Phosphatase: 59 U/L (ref 38–126)
Anion gap: 13 (ref 5–15)
BUN: 12 mg/dL (ref 8–23)
CO2: 27 mmol/L (ref 22–32)
Calcium: 8.8 mg/dL — ABNORMAL LOW (ref 8.9–10.3)
Chloride: 98 mmol/L (ref 98–111)
Creatinine, Ser: 0.84 mg/dL (ref 0.44–1.00)
GFR calc Af Amer: >60 mL/min (ref >60)
GFR calc non Af Amer: >60 mL/min (ref >60)
Glucose, Bld: 134 mg/dL — ABNORMAL HIGH (ref 70–99)
Potassium: 3.6 mmol/L (ref 3.5–5.1)
Sodium: 138 mmol/L (ref 135–145)
Total Bilirubin: 0.9 mg/dL (ref 0.3–1.2)
Total Protein: 7 g/dL (ref 6.5–8.1)

## 2020-01-14 LAB — FIBRINOGEN: Fibrinogen: 748 mg/dL — ABNORMAL HIGH (ref 210–475)

## 2020-01-14 LAB — CBG MONITORING, ED: Glucose-Capillary: 169 mg/dL — ABNORMAL HIGH (ref 70–99)

## 2020-01-14 LAB — FERRITIN: Ferritin: 738 ng/mL — ABNORMAL HIGH (ref 11–307)

## 2020-01-14 LAB — LACTIC ACID, PLASMA: Lactic Acid, Venous: 1.8 mmol/L (ref 0.5–1.9)

## 2020-01-14 LAB — C-REACTIVE PROTEIN: CRP: 12.5 mg/dL — ABNORMAL HIGH (ref <1.0)

## 2020-01-14 LAB — LACTATE DEHYDROGENASE: LDH: 378 U/L — ABNORMAL HIGH (ref 98–192)

## 2020-01-14 LAB — PROCALCITONIN: Procalcitonin: <0.1 ng/mL

## 2020-01-14 LAB — ABO/RH: ABO/RH(D): A POS

## 2020-01-14 LAB — D-DIMER, QUANTITATIVE: D-Dimer, Quant: 1.41 ug/mL-FEU — ABNORMAL HIGH (ref 0.00–0.50)

## 2020-01-14 LAB — TRIGLYCERIDES: Triglycerides: 120 mg/dL (ref <150)

## 2020-01-14 MED ORDER — GUAIFENESIN-DM 100-10 MG/5ML PO SYRP
10.0000 mL | ORAL_SOLUTION | ORAL | Status: DC | PRN
  Administered 2020-01-15 – 2020-01-22 (×6): 10 mL via ORAL
  Filled 2020-01-14 (×8): qty 10

## 2020-01-14 MED ORDER — ASCORBIC ACID 500 MG PO TABS
500.0000 mg | ORAL_TABLET | Freq: Every day | ORAL | Status: DC
  Administered 2020-01-14 – 2020-01-22 (×9): 500 mg via ORAL
  Filled 2020-01-14 (×10): qty 1

## 2020-01-14 MED ORDER — POLYETHYLENE GLYCOL 3350 17 G PO PACK
17.0000 g | PACK | Freq: Every day | ORAL | Status: DC | PRN
  Administered 2020-01-22: 17 g via ORAL
  Filled 2020-01-14: qty 1

## 2020-01-14 MED ORDER — METHOCARBAMOL 500 MG PO TABS
500.0000 mg | ORAL_TABLET | Freq: Four times a day (QID) | ORAL | Status: DC | PRN
  Administered 2020-01-15 – 2020-01-22 (×5): 500 mg via ORAL
  Filled 2020-01-14 (×6): qty 1

## 2020-01-14 MED ORDER — ENOXAPARIN SODIUM 40 MG/0.4ML ~~LOC~~ SOLN: 40.0000 mg | SUBCUTANEOUS | Status: DC

## 2020-01-14 MED ORDER — BARICITINIB 2 MG PO TABS
4.0000 mg | ORAL_TABLET | Freq: Every day | ORAL | Status: DC
  Administered 2020-01-14 – 2020-01-18 (×5): 4 mg via ORAL
  Filled 2020-01-14 (×5): qty 2

## 2020-01-14 MED ORDER — ENOXAPARIN SODIUM 60 MG/0.6ML ~~LOC~~ SOLN
60.0000 mg | SUBCUTANEOUS | Status: DC
  Administered 2020-01-15 – 2020-02-05 (×21): 60 mg via SUBCUTANEOUS
  Filled 2020-01-14 (×23): qty 0.6

## 2020-01-14 MED ORDER — SODIUM CHLORIDE 0.9 % IV SOLN
200.0000 mg | Freq: Once | INTRAVENOUS | Status: AC
  Administered 2020-01-14: 200 mg via INTRAVENOUS
  Filled 2020-01-14: qty 200

## 2020-01-14 MED ORDER — METOPROLOL SUCCINATE ER 25 MG PO TB24
100.0000 mg | ORAL_TABLET | Freq: Every day | ORAL | Status: DC
  Administered 2020-01-14 – 2020-01-23 (×9): 100 mg via ORAL
  Filled 2020-01-14: qty 1
  Filled 2020-01-14: qty 2
  Filled 2020-01-14: qty 1
  Filled 2020-01-14: qty 4
  Filled 2020-01-14: qty 1
  Filled 2020-01-14: qty 2
  Filled 2020-01-14: qty 4
  Filled 2020-01-14: qty 1
  Filled 2020-01-14: qty 2
  Filled 2020-01-14: qty 4

## 2020-01-14 MED ORDER — METHYLPREDNISOLONE SODIUM SUCC 125 MG IJ SOLR
60.0000 mg | Freq: Four times a day (QID) | INTRAMUSCULAR | Status: DC
  Administered 2020-01-14 – 2020-01-22 (×32): 60 mg via INTRAVENOUS
  Filled 2020-01-14 (×32): qty 2

## 2020-01-14 MED ORDER — SODIUM CHLORIDE 0.9 % IV SOLN
100.0000 mg | Freq: Every day | INTRAVENOUS | Status: AC
  Administered 2020-01-15 – 2020-01-18 (×4): 100 mg via INTRAVENOUS
  Filled 2020-01-14 (×4): qty 20

## 2020-01-14 MED ORDER — ZINC SULFATE 220 (50 ZN) MG PO CAPS
220.0000 mg | ORAL_CAPSULE | Freq: Every day | ORAL | Status: DC
  Administered 2020-01-14 – 2020-01-22 (×9): 220 mg via ORAL
  Filled 2020-01-14 (×10): qty 1

## 2020-01-14 MED ORDER — ALBUTEROL SULFATE HFA 108 (90 BASE) MCG/ACT IN AERS
2.0000 | INHALATION_SPRAY | Freq: Four times a day (QID) | RESPIRATORY_TRACT | Status: DC
  Administered 2020-01-14 – 2020-01-22 (×28): 2 via RESPIRATORY_TRACT
  Filled 2020-01-14 (×2): qty 6.7

## 2020-01-14 MED ORDER — OXYBUTYNIN CHLORIDE 5 MG PO TABS
5.0000 mg | ORAL_TABLET | Freq: Two times a day (BID) | ORAL | Status: DC
  Administered 2020-01-14 – 2020-01-22 (×17): 5 mg via ORAL
  Filled 2020-01-14 (×18): qty 1

## 2020-01-14 MED ORDER — INSULIN ASPART 100 UNIT/ML ~~LOC~~ SOLN
0.0000 [IU] | Freq: Three times a day (TID) | SUBCUTANEOUS | Status: DC
  Administered 2020-01-15 (×2): 1 [IU] via SUBCUTANEOUS
  Administered 2020-01-15: 2 [IU] via SUBCUTANEOUS
  Administered 2020-01-16 – 2020-01-17 (×3): 1 [IU] via SUBCUTANEOUS
  Administered 2020-01-17: 2 [IU] via SUBCUTANEOUS
  Administered 2020-01-17: 5 [IU] via SUBCUTANEOUS
  Administered 2020-01-18: 2 [IU] via SUBCUTANEOUS
  Administered 2020-01-18: 5 [IU] via SUBCUTANEOUS
  Administered 2020-01-18: 1 [IU] via SUBCUTANEOUS
  Administered 2020-01-19: 5 [IU] via SUBCUTANEOUS
  Administered 2020-01-19: 2 [IU] via SUBCUTANEOUS
  Administered 2020-01-19: 3 [IU] via SUBCUTANEOUS
  Administered 2020-01-20: 2 [IU] via SUBCUTANEOUS
  Administered 2020-01-20: 1 [IU] via SUBCUTANEOUS
  Administered 2020-01-20 – 2020-01-23 (×9): 2 [IU] via SUBCUTANEOUS
  Administered 2020-01-23: 1 [IU] via SUBCUTANEOUS
  Administered 2020-01-24: 2 [IU] via SUBCUTANEOUS
  Administered 2020-01-24 – 2020-01-25 (×3): 1 [IU] via SUBCUTANEOUS
  Administered 2020-01-25 (×2): 2 [IU] via SUBCUTANEOUS
  Administered 2020-01-26: 1 [IU] via SUBCUTANEOUS
  Administered 2020-01-26 – 2020-01-28 (×5): 2 [IU] via SUBCUTANEOUS
  Administered 2020-01-28 (×2): 1 [IU] via SUBCUTANEOUS
  Administered 2020-01-29: 2 [IU] via SUBCUTANEOUS
  Administered 2020-01-29 – 2020-01-30 (×2): 1 [IU] via SUBCUTANEOUS
  Administered 2020-01-30: 2 [IU] via SUBCUTANEOUS
  Administered 2020-01-30: 3 [IU] via SUBCUTANEOUS
  Administered 2020-01-31 (×2): 2 [IU] via SUBCUTANEOUS
  Administered 2020-01-31: 1 [IU] via SUBCUTANEOUS
  Administered 2020-02-01: 3 [IU] via SUBCUTANEOUS
  Administered 2020-02-01 – 2020-02-02 (×3): 1 [IU] via SUBCUTANEOUS
  Administered 2020-02-02 – 2020-02-03 (×2): 2 [IU] via SUBCUTANEOUS
  Administered 2020-02-03: 3 [IU] via SUBCUTANEOUS
  Administered 2020-02-03: 1 [IU] via SUBCUTANEOUS
  Administered 2020-02-04: 2 [IU] via SUBCUTANEOUS
  Administered 2020-02-04 (×2): 1 [IU] via SUBCUTANEOUS
  Filled 2020-01-14: qty 0.09

## 2020-01-14 MED ORDER — AMLODIPINE BESYLATE 10 MG PO TABS
10.0000 mg | ORAL_TABLET | Freq: Every day | ORAL | Status: DC
  Administered 2020-01-14 – 2020-01-22 (×8): 10 mg via ORAL
  Filled 2020-01-14: qty 1
  Filled 2020-01-14: qty 2
  Filled 2020-01-14 (×5): qty 1
  Filled 2020-01-14: qty 2
  Filled 2020-01-14: qty 1
  Filled 2020-01-14: qty 2

## 2020-01-14 NOTE — Progress Notes (Signed)
TOC CM received referral to follow up on oxygen. Pt was arranged with oxygen at last ED visit with [employer]. Pt may need COVID remote home program to follow up with her at time of dc. Isidoro Donning RN CCM, [facility] TOC CM [phone]

## 2020-01-14 NOTE — H&P (Signed)
History and Physical    Julie Mcguire:[id number] DOB: Oct 12, 1955 DOA: 01/04/2020  PCP: Elias Else, MD   Patient coming from: Home    Chief Complaint: Worsening shortness of breath  HPI: Julie Mcguire is a 64 y.o. female with medical history significant of recent Covid pneumonia, hypertension, moderate obesity who presents to the emergency room today with complaints of worsening shortness of breath.  She was just discharged from here about 4 days ago after  she was managed for Covid pneumonia.  She was discharged on 4 L of oxygen per minute.  Immediately after discharge, she was feeling okay but she again became short of breath, became weak, developed cough and thus presented to the emergency department.  On her last admission, she was treated with steroids, remdesivir and  baricitinib. On EMS arrival, she was hypoxic and was saturating 75% on 6 L of nasal cannula.  Saturation good to 85% with 15 L nonrebreather mask.  Patient was then brought to the emergency department.  As expected, patient has not been vaccinated. Patient seen and examined at the bedside this afternoon in the emergency department.  After put on nonrebreather, she is feeling better but she was hardly able to speak in full sentences during my evaluation and says she is not feeling good.  She is maintaining her saturation on nonrebreather.  She is coughing and bringing up yellow-greenish sputum. She she also mentioned that her husband has Covid at home but not sick as her.  She says she is feeling very weak, has some stomach upset but denies having any nausea, vomiting, diarrhea, headache or dysuria.   ED Course: Found to be tachypneic, tachycardic, hypertensive on arrival.    Lab work showed elevated inflammatory markers with CRP in the range of 12.  Chest x-ray showed moderate to marked severe bilateral patchy infiltrates.  Patient admitted for the management of severe Covid pneumonia.  Review of Systems: As per HPI  otherwise 10 point review of systems negative.    Past Medical History:  Diagnosis Date  . Hypertension     Past Surgical History:  Procedure Laterality Date  . ABDOMINAL HYSTERECTOMY    . APPENDECTOMY    . CHOLECYSTECTOMY    . TONSILLECTOMY    . TOTAL HIP ARTHROPLASTY Left 09/22/2017   Procedure: LEFT TOTAL HIP ARTHROPLASTY ANTERIOR APPROACH;  Surgeon: Kathryne Hitch, MD;  Location: [facility];  Service: Orthopedics;  Laterality: Left;     reports that she quit smoking about 24 years ago. She has a 15.00 pack-year smoking history. She has never used smokeless tobacco. She reports current alcohol use. She reports that she does not use drugs.  Allergies  Allergen Reactions  . Codeine Nausea And Vomiting    History reviewed. No pertinent family history.   Prior to Admission medications   Medication Sig Start Date End Date Taking? Authorizing Provider  acetaminophen (TYLENOL) 500 MG tablet Take 1,000 mg by mouth as needed for moderate pain or headache.     [provider]  amLODipine (NORVASC) 10 MG tablet Take 10 mg by mouth daily. 01/07/20   [provider]  methocarbamol (ROBAXIN) 500 MG tablet Take 1 tablet (500 mg total) by mouth every 6 (six) hours as needed for muscle spasms. Patient not taking: Reported on 01/07/2020 09/23/17   Kathryne Hitch, MD  metoprolol succinate (TOPROL-XL) 100 MG 24 hr tablet Take 100 mg by mouth daily. 08/17/17   [provider]  oxybutynin (DITROPAN) 5 MG  tablet Take 5 mg by mouth 2 (two) times daily. 01/06/20   [provider]  predniSONE (DELTASONE) 10 MG tablet Take 4 tablets (40 mg total) by mouth daily for 3 days, THEN 3 tablets (30 mg total) daily for 3 days, THEN 2 tablets (20 mg total) daily for 3 days, THEN 1 tablet (10 mg total) daily for 3 days. 01/10/20 01/22/20  Azucena Fallen, MD  valsartan-hydrochlorothiazide (DIOVAN-HCT) 320-25 MG tablet Take 1 tablet by mouth daily. 01/07/20   [provider]    Physical Exam: Vitals:   01/10/2020 1543 12/15/2019 1550 12/23/2019 1615 01/13/2020 1630  BP:  (!) 147/85 (!) 156/90 107/74  Pulse:   (!) 105 (!) 123  Resp:   (!) 22 (!) 23  Temp:      TempSrc:      SpO2: 93% 93% 96% 93%    Constitutional: Dyspneic, weak, morbidly obese Vitals:   12/25/2019 1543 12/31/2019 1550 12/19/2019 1615 01/11/2020 1630  BP:  (!) 147/85 (!) 156/90 107/74  Pulse:   (!) 105 (!) 123  Resp:   (!) 22 (!) 23  Temp:      TempSrc:      SpO2: 93% 93% 96% 93%   Eyes: PERRL, lids and conjunctivae normal ENMT: Mucous membranes are moist.  Neck: normal, supple, no masses, no thyromegaly Respiratory: In moderate respiratory distress, decreased bilateral air entry, no frank wheezes or crackles heard given the quality of the stethoscope and noise in the room.   Cardiovascular: Sinus tachycardia, no murmurs / rubs / gallops. No extremity edema.  Abdomen: no tenderness, no masses palpated. No hepatosplenomegaly. Bowel sounds positive.  Musculoskeletal: no clubbing / cyanosis. No joint deformity upper and lower extremities.  Skin: no rashes, lesions, ulcers. No induration Neurologic: CN 2-12 grossly intact.  Strength 5/5 in all 4.  Psychiatric: Normal judgment and insight. Alert and oriented x 3.  Foley Catheter:None  Labs on Admission: I have personally reviewed following labs and imaging studies  CBC: Recent Labs  Lab 01/08/20 0500 01/09/20 0332 01/10/20 0341 01/03/2020 1554  WBC 4.8 5.8 7.3 20.1*  NEUTROABS 4.1 4.3 5.6 PENDING  HGB 12.7 12.5 13.0 13.0  HCT 38.2 38.6 40.6 39.9  MCV 91.8 92.3 93.5 93.2  PLT 143* 157 226 467*   Basic Metabolic Panel: Recent Labs  Lab 01/08/20 0500 01/09/20 0332 01/10/20 0341 12/30/2019 1554  NA 139 141 143 138  K 3.9 4.0 4.2 3.6  CL 106 107 107 98  CO2 23 23 24 27   GLUCOSE 162* 149* 151* 134*  BUN 41* 39* 41* 12  CREATININE 1.59* 1.21* 1.23* 0.84  CALCIUM 8.1* 8.7* 9.2 8.8*  MG 1.9 1.9 2.2  --    GFR: Estimated  Creatinine Clearance: 89.3 mL/min (by C-G formula based on SCr of 0.84 mg/dL). Liver Function Tests: Recent Labs  Lab 01/08/20 0500 01/09/20 0332 01/10/20 0341 01/01/2020 1554  AST 50* 45* 73* 29  ALT 25 25 45* 41  ALKPHOS 62 55 62 59  BILITOT 0.4 0.1* 0.6 0.9  PROT 6.5 6.4* 6.8 7.0  ALBUMIN 3.3* 3.2* 3.5 3.1*   No results for input(s): LIPASE, AMYLASE in the last 168 hours. No results for input(s): AMMONIA in the last 168 hours. Coagulation Profile: No results for input(s): INR, PROTIME in the last 168 hours. Cardiac Enzymes: No results for input(s): CKTOTAL, CKMB, CKMBINDEX, TROPONINI in the last 168 hours. BNP (last 3 results) No results for input(s): PROBNP in the last 8760 hours. HbA1C: No  results for input(s): HGBA1C in the last 72 hours. CBG: No results for input(s): GLUCAP in the last 168 hours. Lipid Profile: Recent Labs    27-Jan-2020 1554  TRIG 120   Thyroid Function Tests: No results for input(s): TSH, T4TOTAL, FREET4, T3FREE, THYROIDAB in the last 72 hours. Anemia Panel: Recent Labs    01/27/20 1554  FERRITIN 738*   Urine analysis:    Component Value Date/Time   COLORURINE YELLOW 01/07/2020 1819   APPEARANCEUR TURBID (A) 01/07/2020 1819   LABSPEC 1.018 01/07/2020 1819   PHURINE 5.0 01/07/2020 1819   GLUCOSEU NEGATIVE 01/07/2020 1819   HGBUR MODERATE (A) 01/07/2020 1819   BILIRUBINUR NEGATIVE 01/07/2020 1819   KETONESUR NEGATIVE 01/07/2020 1819   PROTEINUR >=300 (A) 01/07/2020 1819   NITRITE POSITIVE (A) 01/07/2020 1819   LEUKOCYTESUR TRACE (A) 01/07/2020 1819    Radiological Exams on Admission: DG Chest Port 1 View  Result Date: 2020-01-27 CLINICAL DATA:  COVID positive with shortness of breath. EXAM: PORTABLE CHEST 1 VIEW COMPARISON:  January 07, 2020 FINDINGS: Moderate to marked severity bilateral patchy multifocal infiltrates are seen. The heart size and mediastinal contours are within normal limits. Degenerative changes seen throughout the  thoracic spine. IMPRESSION: Moderate to marked severity bilateral patchy multifocal infiltrates. Electronically Signed   By: Aram Candela M.D.   On: 01-27-20 16:59     Assessment/Plan Principal Problem:   Acute on chronic respiratory failure with hypoxia (HCC) Active Problems:   Pneumonia due to COVID-19 virus   Essential hypertension   Obesity, Class III, BMI 40-49.9 (morbid obesity) (HCC)   Acute respiratory failure with hypoxia (HCC)   Acute on  respiratory failure with hypoxia: Secondary to Covid pneumonia.  She was recently discharged on 4 L of oxygen per minute.  Hypoxic on nasal cannula and had to be put on nonrebreather.  We will closely monitor her  respiratory status.  Covid pneumonia: She is unvaccinated.Patient was admitted on 01/07/2020 for the management of Covid pneumonia and was discharged on 01/10/2020 after she improved.  She was discharged on 4 L of oxygen and  steroid taper.  She was initially exposed to Covid from her daughter who was also sick.    She was treated with Solu-Medrol, remdesivir, baricitinib. Chest x-ray this time showed moderate to marked severe bilateral patchy infiltrates.  We will restart Solu-Medrol, remdesivir and baricitinib on this admission because of her severity of illness.  Continue to monitor inflammatory markers.  Continue DVT prophylaxis with Lovenox.  Continue supplemental oxygen as needed.  Currently on nonrebreather. Continue vitamins, cough medications.  Leukocytosis: Most likely secondary to steroids.Currently she is afebrile. procalcitonin is negative.  Continue to monitor the trend  Hypertension: Hypertensive and tachycardic on presentation.  She was on blood pressure medications including metoprolol at home.  Will resume metoprolol and amlodipine.  Morbid obesity: BMI 45.  Severity of Illness: The appropriate patient status for this patient is INPATIENT.   DVT prophylaxis: Lovenox Code Status: Full Family Communication:  Call and discussed with husband on phone.Husband requests regular  updates  Consults called: None     Burnadette Pop MD [facility]  01-27-20, 5:32 PM

## 2020-01-14 NOTE — ED Notes (Signed)
Julie Mcguire, husband would like an update [phone].

## 2020-01-14 NOTE — ED Provider Notes (Signed)
[facility]-EMERGENCY DEPT Provider Note   CSN: [id number] Arrival date & time: 12/31/2019  1448     History Chief Complaint  Patient presents with   Shortness of Breath    Julie Mcguire is a 64 y.o. female.  HPI    Patient presents 4 days after being discharged following admission for coronavirus now with respiratory distress. Patient notes that on discharge she was okay, but declined over the following 4 days, and today is dyspneic, weak.  No focal pain.  No obvious fever. EMS reports the patient was hypoxic, 75% on 6 L nasal cannula, improved to 85% with 15 L nonrebreather mask.  Patient also received epinephrine from EMS. Patient did not receive her coronavirus vaccine.  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     No family history on file.  Social History   Tobacco Use   Smoking status: Former Smoker    Packs/day: 1.00    Years: 15.00    Pack years: 15.00    Quit date: 05/17/1995     Years since quitting: 24.6   Smokeless tobacco: Never Used  Vaping Use   Vaping Use: Never used  Substance Use Topics   Alcohol use: Yes    Comment: occas   Drug use: Never    Home Medications Prior to Admission medications   Medication Sig Start Date End Date Taking? Authorizing Provider  acetaminophen (TYLENOL) 500 MG tablet Take 1,000 mg by mouth as needed for moderate pain or headache.     [provider]  amLODipine (NORVASC) 10 MG tablet Take 10 mg by mouth daily. 01/07/20   [provider]  methocarbamol (ROBAXIN) 500 MG tablet Take 1 tablet (500 mg total) by mouth every 6 (six) hours as needed for muscle spasms. Patient not taking: Reported on 01/07/2020 09/23/17   Kathryne Hitch, MD  metoprolol succinate (TOPROL-XL) 100 MG 24 hr tablet Take 100 mg by mouth daily. 08/17/17   [provider]  oxybutynin (DITROPAN) 5 MG tablet Take 5 mg by mouth 2 (two) times daily. 01/06/20   [provider]  predniSONE (DELTASONE) 10 MG tablet Take 4 tablets (40 mg total) by mouth daily for 3 days, THEN 3 tablets (30 mg total) daily for 3 days, THEN 2 tablets (20 mg total) daily for 3 days, THEN 1 tablet (10 mg total) daily for 3 days. 01/10/20 01/22/20  Azucena Fallen, MD  valsartan-hydrochlorothiazide (DIOVAN-HCT) 320-25 MG tablet Take 1 tablet by mouth daily. 01/07/20  [provider]    Allergies    Codeine  Review of Systems   Review of Systems  Constitutional:       Per HPI, otherwise negative  HENT:       Per HPI, otherwise negative  Respiratory:       Per HPI, otherwise negative  Cardiovascular:       Per HPI, otherwise negative  Gastrointestinal: Negative for vomiting.  Endocrine:       Negative aside from HPI  Genitourinary:       Neg aside from HPI   Musculoskeletal:       Per HPI, otherwise negative  Skin: Negative.   Allergic/Immunologic: Negative for immunocompromised state.  Neurological: Positive for weakness.  Negative for syncope.    Physical Exam Updated Vital Signs BP (!) 147/85 (BP Location: Right Arm)    Pulse (!) 104    Temp 98.9 F (37.2 C) (Oral)    Resp (!) 32    SpO2 93%   Physical Exam Vitals and nursing note reviewed.  Constitutional:      General: She is in acute distress.     Appearance: She is well-developed. She is ill-appearing and diaphoretic.  HENT:     Head: Normocephalic and atraumatic.  Eyes:     Conjunctiva/sclera: Conjunctivae normal.  Cardiovascular:     Rate and Rhythm: Regular rhythm. Tachycardia present.  Pulmonary:     Effort: Tachypnea and respiratory distress present.  Abdominal:     General: There is no distension.  Skin:    General: Skin is warm.     Coloration: Skin is not pale.  Neurological:     Mental Status: She is alert and oriented to person, place, and time.     Cranial Nerves: No cranial nerve deficit.     ED Results / Procedures / Treatments   Labs (all labs ordered are listed, but only abnormal results are displayed) Labs Reviewed  CULTURE, BLOOD (ROUTINE X 2)  CULTURE, BLOOD (ROUTINE X 2)  LACTIC ACID, PLASMA  LACTIC ACID, PLASMA  CBC WITH DIFFERENTIAL/PLATELET  COMPREHENSIVE METABOLIC PANEL  D-DIMER, QUANTITATIVE (NOT AT [facility])  PROCALCITONIN  LACTATE DEHYDROGENASE  FERRITIN  TRIGLYCERIDES  FIBRINOGEN  C-REACTIVE PROTEIN    EKG None  Radiology No results found.  Procedures Procedures (including critical care time)  Medications Ordered in ED Medications - No data to display  ED Course  I have reviewed the triage vital signs and the nursing notes.  Pertinent labs & imaging results that were available during my care of the patient were reviewed by me and considered in my medical decision making (see chart for details).    Chart review after initial evaluation notable for discharge summary from 4 days ago describing her admission for coronavirus, seemingly improved, discharged on home oxygen.   MDM  Rules/Calculators/A&P Adult female nonvaccinated presents after recent discharge following admission for coronavirus, now in respiratory distress requiring supplemental oxygen via nonrebreather mask rather than her nasal cannula she was using on discharge. Given the patient's respiratory distress, evidence for SIRS, she required readmission to the hospital for ongoing coronavirus therapy.  Julie Mcguire was evaluated in Emergency Department on 08-Feb-2020 for the symptoms described in the history of present illness. She was evaluated in the context of the global COVID-19 pandemic, which necessitated consideration that the patient might be at risk for infection with the SARS-CoV-2 virus that causes COVID-19. Institutional protocols and algorithms that pertain to the evaluation of patients at risk for COVID-19  are in a state of rapid change based on information released by regulatory bodies including the CDC and federal and state organizations. These policies and algorithms were followed during the patient's care in the ED.  Final Clinical Impression(s) / ED Diagnoses Final diagnoses:  Respiratory distress  SIRS (systemic inflammatory response syndrome) (HCC)  COVID-19   CRITICAL CARE Performed by: Gerhard Munch Total critical care time: 35 minutes Critical care time was exclusive of separately billable procedures and treating other patients. Critical care was necessary to treat or prevent imminent or life-threatening deterioration. Critical care was time spent personally by me on the following activities: development of treatment plan with patient and/or surrogate as well as nursing, discussions with consultants, evaluation of patient's response to treatment, examination of patient, obtaining history from patient or surrogate, ordering and performing treatments and interventions, ordering and review of laboratory studies, ordering and review of radiographic studies, pulse oximetry and re-evaluation  of patient's condition.    Gerhard Munch, MD 12/23/2019 2311

## 2020-01-14 NOTE — ED Triage Notes (Signed)
Pt arrived via EMS, from home, COVID (+), seen recently, d/c with home o2, spo2 75% on 6L [place] at home. 85% on 15L NRB Only able to speak in short sentences.   0.3 mg epi IM by EMS

## 2020-01-15 ENCOUNTER — Other Ambulatory Visit: Payer: Self-pay

## 2020-01-15 LAB — BLOOD CULTURE ID PANEL (REFLEXED) - BCID2

## 2020-01-15 LAB — CBC WITH DIFFERENTIAL/PLATELET
Abs Immature Granulocytes: 0.81 10*3/uL — ABNORMAL HIGH (ref 0.00–0.07)
Basophils Absolute: 0.1 10*3/uL (ref 0.0–0.1)
Basophils Relative: 1 %
Eosinophils Absolute: 0 10*3/uL (ref 0.0–0.5)
Eosinophils Relative: 0 %
HCT: 39.7 % (ref 36.0–46.0)
Hemoglobin: 12.6 g/dL (ref 12.0–15.0)
Immature Granulocytes: 8 %
Lymphocytes Relative: 4 %
Lymphs Abs: 0.5 10*3/uL — ABNORMAL LOW (ref 0.7–4.0)
MCH: 29.8 pg (ref 26.0–34.0)
MCHC: 31.7 g/dL (ref 30.0–36.0)
MCV: 93.9 fL (ref 80.0–100.0)
Monocytes Absolute: 0.2 10*3/uL (ref 0.1–1.0)
Monocytes Relative: 2 %
Neutro Abs: 9.2 10*3/uL — ABNORMAL HIGH (ref 1.7–7.7)
Neutrophils Relative %: 85 %
Platelets: 447 10*3/uL — ABNORMAL HIGH (ref 150–400)
RBC: 4.23 MIL/uL (ref 3.87–5.11)
RDW: 14.6 % (ref 11.5–15.5)
WBC: 10.7 10*3/uL — ABNORMAL HIGH (ref 4.0–10.5)
nRBC: 0 % (ref 0.0–0.2)

## 2020-01-15 LAB — CBG MONITORING, ED
Glucose-Capillary: 144 mg/dL — ABNORMAL HIGH (ref 70–99)
Glucose-Capillary: 136 mg/dL — ABNORMAL HIGH (ref 70–99)
Glucose-Capillary: 165 mg/dL — ABNORMAL HIGH (ref 70–99)
Glucose-Capillary: 180 mg/dL — ABNORMAL HIGH (ref 70–99)

## 2020-01-15 LAB — COMPREHENSIVE METABOLIC PANEL
ALT: 42 U/L (ref 0–44)
AST: 34 U/L (ref 15–41)
Albumin: 2.9 g/dL — ABNORMAL LOW (ref 3.5–5.0)
Alkaline Phosphatase: 65 U/L (ref 38–126)
Anion gap: 12 (ref 5–15)
BUN: 18 mg/dL (ref 8–23)
CO2: 25 mmol/L (ref 22–32)
Calcium: 8.6 mg/dL — ABNORMAL LOW (ref 8.9–10.3)
Chloride: 101 mmol/L (ref 98–111)
Creatinine, Ser: 0.73 mg/dL (ref 0.44–1.00)
GFR calc Af Amer: >60 mL/min (ref >60)
GFR calc non Af Amer: >60 mL/min (ref >60)
Glucose, Bld: 158 mg/dL — ABNORMAL HIGH (ref 70–99)
Potassium: 4.3 mmol/L (ref 3.5–5.1)
Sodium: 138 mmol/L (ref 135–145)
Total Bilirubin: 0.7 mg/dL (ref 0.3–1.2)
Total Protein: 6.9 g/dL (ref 6.5–8.1)

## 2020-01-15 LAB — D-DIMER, QUANTITATIVE: D-Dimer, Quant: 1.72 ug/mL-FEU — ABNORMAL HIGH (ref 0.00–0.50)

## 2020-01-15 LAB — PHOSPHORUS: Phosphorus: 3.9 mg/dL (ref 2.5–4.6)

## 2020-01-15 LAB — MAGNESIUM: Magnesium: 2.2 mg/dL (ref 1.7–2.4)

## 2020-01-15 NOTE — ED Notes (Signed)
Pt has 2 inhalers at bedside (albuterol)

## 2020-01-15 NOTE — Progress Notes (Signed)
PHARMACY - PHYSICIAN COMMUNICATION CRITICAL VALUE ALERT - BLOOD CULTURE IDENTIFICATION (BCID)  Julie Mcguire is an 64 y.o. female who presented to [facility] on 2020/01/30 with a chief complaint of COVID pneumonia.  Assessment:  COVID PNA on remdesivir & baricitinib.  Afebrile. Anaerobic bottle of 1 set blood growing now growing [facility]- BCID + S. Epi (no resistance) -->likely contaminant.   Name of physician (or Provider) Contacted: Cherylin Mylar  Current antibiotics: none  Changes to prescribed antibiotics recommended:  None- Observe off antibiotics  Results for orders placed or performed during the hospital encounter of 01/30/20  Blood Culture ID Panel (Reflexed) (Collected: 2020-01-30  4:00 PM)  Result Value Ref Range   Enterococcus faecalis NOT DETECTED NOT DETECTED   Enterococcus Faecium NOT DETECTED NOT DETECTED   Listeria monocytogenes NOT DETECTED NOT DETECTED   Staphylococcus species DETECTED (A) NOT DETECTED   Staphylococcus aureus (BCID) NOT DETECTED NOT DETECTED   Staphylococcus epidermidis DETECTED (A) NOT DETECTED   Staphylococcus lugdunensis NOT DETECTED NOT DETECTED   Streptococcus species NOT DETECTED NOT DETECTED   Streptococcus agalactiae NOT DETECTED NOT DETECTED   Streptococcus pneumoniae NOT DETECTED NOT DETECTED   Streptococcus pyogenes NOT DETECTED NOT DETECTED   A.calcoaceticus-baumannii NOT DETECTED NOT DETECTED   Bacteroides fragilis NOT DETECTED NOT DETECTED   Enterobacterales NOT DETECTED NOT DETECTED   Enterobacter cloacae complex NOT DETECTED NOT DETECTED   Escherichia coli NOT DETECTED NOT DETECTED   Klebsiella aerogenes NOT DETECTED NOT DETECTED   Klebsiella oxytoca NOT DETECTED NOT DETECTED   Klebsiella pneumoniae NOT DETECTED NOT DETECTED   Proteus species NOT DETECTED NOT DETECTED   Salmonella species NOT DETECTED NOT DETECTED   Serratia marcescens NOT DETECTED NOT DETECTED   Haemophilus influenzae NOT DETECTED NOT DETECTED   Neisseria  meningitidis NOT DETECTED NOT DETECTED   Pseudomonas aeruginosa NOT DETECTED NOT DETECTED   Stenotrophomonas maltophilia NOT DETECTED NOT DETECTED   Candida albicans NOT DETECTED NOT DETECTED   Candida auris NOT DETECTED NOT DETECTED   Candida glabrata NOT DETECTED NOT DETECTED   Candida krusei NOT DETECTED NOT DETECTED   Candida parapsilosis NOT DETECTED NOT DETECTED   Candida tropicalis NOT DETECTED NOT DETECTED   Cryptococcus neoformans/gattii NOT DETECTED NOT DETECTED   Methicillin resistance mecA/C NOT DETECTED NOT DETECTED    Junita Push PharmD 01/15/2020  9:58 PM

## 2020-01-15 NOTE — ED Notes (Signed)
Pt's O2 sats dropped into the mid 70s when eating, as she has to remove the NRB.

## 2020-01-15 NOTE — ED Notes (Signed)
Attempted to call pt's husband, [name] with updates. Left voicemail. Pt has phone at the bedside

## 2020-01-15 NOTE — Progress Notes (Signed)
PROGRESS NOTE    Julie Mcguire  NWG:[id number] DOB: November 19, 1955 DOA: 01/16/2020 PCP: Elias Else, MD    Brief Narrative:  Julie Mcguire is a 64 y.o. female with medical history significant of recent Covid pneumonia, hypertension, moderate obesity who presented to the emergency room with complaints of worsening shortness of breath.   Recently discharged from the hospital on 01/10/2020 following treatment for Covid-19 viral pneumonia with steroids, remdesivir, and baricitinib.  She was discharged on 4 L of oxygen per minute. On EMS arrival, she was hypoxic and was saturating 75% on 6 L of nasal cannula.  Saturation good to 85% with 15 L nonrebreather mask.  Patient was then brought to the emergency department.  As expected, patient has not been vaccinated. She she also mentioned that her husband has Covid at home but not sick as her.  She says she is feeling very weak, has some stomach upset but denies having any nausea, vomiting, diarrhea, headache or dysuria.  In the ED, she was tachypneic, tachycardic, hypertensive on arrival.    Lab work showed elevated inflammatory markers with CRP in the range of 12. Chest x-ray showed moderate to marked severe bilateral patchy infiltrates. Patient admitted for the management of severe refractory Covid pneumonia.   Assessment & Plan:   Principal Problem:   Acute respiratory failure with hypoxia (HCC) Active Problems:   Pneumonia due to COVID-19 virus   Essential hypertension   Obesity, Class III, BMI 40-49.9 (morbid obesity) (HCC)   Acute hypoxic respiratory failure secondary to acute Covid-19 viral pneumonia during the ongoing 2020/2021 Covid 19 Pandemic - POA Patient representing to the ED following recent discharge 4 days prior for progressive shortness of breath despite treatment for Covid-19 with remdesivir, baricitinib and steroids.  Was discharged home on 4 L nasal cannula, notably hypoxic requiring 15 L NRB upon ED presentation.  Chest  x-ray with moderate to marked severity bilateral patchy multifocal infiltrates.  Procalcitonin less than 0.10. --COVID test: PCR + 01/07/2020 --CRP 2.2 on 8/26...12.5 --ddimer 1.06 on 8/26...1.72 --Remdesivir, plan 5-day course (Day 2/5) --Baricitinib 4mg  PO daily (Day 2/14 ) --Continue Solumedrol 60 mg IV every 6 hours --prone for 2-3hrs every 12hrs if able --Continue supplemental oxygen, titrate to maintain SPO2 greater than 92% --Continue supportive care with albuterol MDI prn, vitamin C, zinc, Tylenol, antitussives (benzonatate/ Mucinex/Tussionex) --Incentive spirometry, flutter valve --Follow CBC, CMP, D-dimer, ferritin, and CRP daily --Continue airborne/contact isolation precautions for 3 weeks from the day of diagnosis  The treatment plan and use of medications and known side effects were discussed with patient/family. Some of the medications used are based on case reports/anecdotal data.  All other medications being used in the management of COVID-19 based on limited study data.  Complete risks and long-term side effects are unknown, however in the best clinical judgment they seem to be of some benefit.  Patient wanted to proceed with treatment options provided.  Essential hypertension BP 125/71 this morning, well controlled. --Continue amlodipine 10 mg p.o. daily --Continue metoprolol succinate 100 mg p.o. daily  Morbid obesity BMI 45.03 kg/m.  Discussed with patient need for aggressive lifestyle changes, weight loss as this complicates all facets of care.   DVT prophylaxis: Lovenox Code Status: Full code Family Communication: Updated patient extensively at bedside  Disposition Plan:  Status is: Inpatient  Remains inpatient appropriate because:Ongoing diagnostic testing needed not appropriate for outpatient work up, Unsafe d/c plan, IV treatments appropriate due to intensity of illness or inability to take PO and  Inpatient level of care appropriate due to severity of  illness   Dispo: The patient is from: Home              Anticipated d/c is to: To be determined              Anticipated d/c date is: > 3 days              Patient currently is not medically stable to d/c.   Consultants:   None  Procedures:   None  Antimicrobials:   Remdesivir 8/31>>  Baricitinib 8/31>>   Subjective: Patient seen and examined bedside, resting comfortably but with observable respiratory distress.  On 13 L high flow nasal cannula with SPO2 89%.  No other complaints or concerns at this time.  Denies headache, no dizziness, no fever/chills/night sweats, no nausea cefonicid diarrhea, no chest pain, no palpitations, no abdominal pain.  No acute events overnight per nursing staff.  Objective: Vitals:   01/15/20 1300 01/15/20 1315 01/15/20 1330 01/15/20 1345  BP: 123/65     Pulse: 65 65 99 85  Resp: (!) 21 (!) 22 (!) 26 (!) 24  Temp:      TempSrc:      SpO2: (!) 82% (!) 83% (!) 85% (!) 84%   No intake or output data in the 24 hours ending 01/15/20 1501 There were no vitals filed for this visit.  Examination:  General exam: Appears calm and comfortable  Respiratory system: Decreased breath sounds bilaterally, slight increased work of breathing, on 13 L high flow nasal cannula with SPO2 89%. Cardiovascular system: S1 & S2 heard, RRR. No JVD, murmurs, rubs, gallops or clicks. No pedal edema. Gastrointestinal system: Abdomen is nondistended, soft and nontender. No organomegaly or masses felt. Normal bowel sounds heard. Central nervous system: Alert and oriented. No focal neurological deficits. Extremities: Symmetric 5 x 5 power. Skin: No rashes, lesions or ulcers Psychiatry: Judgement and insight appear normal. Mood & affect appropriate.     Data Reviewed: I have personally reviewed following labs and imaging studies  CBC: Recent Labs  Lab 01/09/20 0332 01/10/20 0341 12/19/2019 1554 01/15/20 0536  WBC 5.8 7.3 20.1* 10.7*  NEUTROABS 4.3 5.6 16.4* 9.2*   HGB 12.5 13.0 13.0 12.6  HCT 38.6 40.6 39.9 39.7  MCV 92.3 93.5 93.2 93.9  PLT 157 226 467* 447*   Basic Metabolic Panel: Recent Labs  Lab 01/09/20 0332 01/10/20 0341 01/04/2020 1554 01/15/20 0536  NA 141 143 138 138  K 4.0 4.2 3.6 4.3  CL 107 107 98 101  CO2 23 24 27 25   GLUCOSE 149* 151* 134* 158*  BUN 39* 41* 12 18  CREATININE 1.21* 1.23* 0.84 0.73  CALCIUM 8.7* 9.2 8.8* 8.6*  MG 1.9 2.2  --  2.2  PHOS  --   --   --  3.9   GFR: Estimated Creatinine Clearance: 93.7 mL/min (by C-G formula based on SCr of 0.73 mg/dL). Liver Function Tests: Recent Labs  Lab 01/09/20 0332 01/10/20 0341 12/26/2019 1554 01/15/20 0536  AST 45* 73* 29 34  ALT 25 45* 41 42  ALKPHOS 55 62 59 65  BILITOT 0.1* 0.6 0.9 0.7  PROT 6.4* 6.8 7.0 6.9  ALBUMIN 3.2* 3.5 3.1* 2.9*   No results for input(s): LIPASE, AMYLASE in the last 168 hours. No results for input(s): AMMONIA in the last 168 hours. Coagulation Profile: No results for input(s): INR, PROTIME in the last 168 hours. Cardiac Enzymes: No results for input(s):  CKTOTAL, CKMB, CKMBINDEX, TROPONINI in the last 168 hours. BNP (last 3 results) No results for input(s): PROBNP in the last 8760 hours. HbA1C: No results for input(s): HGBA1C in the last 72 hours. CBG: Recent Labs  Lab 01/04/2020 2323 01/15/20 0751 01/15/20 1159  GLUCAP 169* 144* 136*   Lipid Profile: Recent Labs    01/07/2020 1554  TRIG 120   Thyroid Function Tests: No results for input(s): TSH, T4TOTAL, FREET4, T3FREE, THYROIDAB in the last 72 hours. Anemia Panel: Recent Labs    01/08/2020 1554  FERRITIN 738*   Sepsis Labs: Recent Labs  Lab 01/04/2020 1554  PROCALCITON <0.10  LATICACIDVEN 1.8    Recent Results (from the past 240 hour(s))  Blood Culture (routine x 2)     Status: None   Collection Time: 01/07/20  2:30 PM   Specimen: BLOOD RIGHT HAND  Result Value Ref Range Status   Specimen Description   Final    BLOOD RIGHT HAND Performed at [facility], [address], [place], [place] [address]    Special Requests   Final    BOTTLES DRAWN AEROBIC AND ANAEROBIC Blood Culture adequate volume Performed at [facility], [address], [place], [place] [address]    Culture   Final    NO GROWTH 5 DAYS Performed at [facility], [address] [place], [place] [address]    Report Status 01/12/2020 FINAL  Final  Blood Culture (routine x 2)     Status: None   Collection Time: 01/07/20  3:03 PM   Specimen: BLOOD  Result Value Ref Range Status   Specimen Description   Final    BLOOD RIGHT ANTECUBITAL Performed at [facility], [address] [place], [place] [address]    Special Requests   Final    BOTTLES DRAWN AEROBIC AND ANAEROBIC Blood Culture results may not be optimal due to an excessive volume of blood received in culture bottles Performed at [facility], [address] [place], [place] [address]    Culture   Final    NO GROWTH 5 DAYS Performed at [facility], [address] [place], [place] [address]    Report Status 01/12/2020 FINAL  Final  SARS Coronavirus 2 by RT PCR (hospital order, performed in [place] hospital lab) Nasopharyngeal Nasopharyngeal Swab     Status: Abnormal   Collection Time: 01/07/20  3:09 PM   Specimen: Nasopharyngeal Swab  Result Value Ref Range Status   SARS Coronavirus 2 POSITIVE (A) NEGATIVE Final    Comment: RESULT CALLED TO, READ BACK BY AND VERIFIED WITH: WEST,S. RN @1816  01/07/20 BILLINGSLEY,L (NOTE) SARS-CoV-2 target nucleic acids are DETECTED  SARS-CoV-2 RNA is generally detectable in upper respiratory specimens  during the acute phase of infection.  Positive results are indicative  of the presence of the identified virus, but do not rule out bacterial infection or co-infection with other pathogens not detected by the test.  Clinical correlation with patient history and  other diagnostic  information is necessary to determine patient infection status.  The expected result is negative.  Fact Sheet for Patients:   01/09/20   Fact Sheet for Healthcare Providers:   BoilerBrush.com.cy    This test is not yet approved or cleared by the https://pope.com/ FDA and  has been authorized for detection and/or diagnosis of SARS-CoV-2 by FDA under an Emergency Use Authorization (EUA).  This EUA will remain in effect (meaning thi s  test can be used) for the duration of  the COVID-19 declaration under Section 564(b)(1) of the Act, 21 U.S.C. section 360-bbb-3(b)(1), unless the authorization is terminated or revoked sooner.  Performed at [facility], [address] [place], [place] [address]   Blood Culture (routine x 2)     Status: None (Preliminary result)   Collection Time: 2020-01-15  3:54 PM   Specimen: BLOOD  Result Value Ref Range Status   Specimen Description   Final    BLOOD RIGHT ANTECUBITAL Performed at [facility], [address], [place], [place] [address]    Special Requests   Final    BOTTLES DRAWN AEROBIC AND ANAEROBIC Blood Culture results may not be optimal due to an inadequate volume of blood received in culture bottles Performed at [facility], [address], [place], [place] [address]    Culture   Final    NO GROWTH < 12 HOURS Performed at [facility], [address] [place], [place] [address]    Report Status PENDING  Incomplete  Blood Culture (routine x 2)     Status: None (Preliminary result)   Collection Time: 01/15/2020  4:00 PM   Specimen: BLOOD LEFT HAND  Result Value Ref Range Status   Specimen Description   Final    BLOOD LEFT HAND Performed at [facility], [address], [place], [place] [address]    Special Requests   Final    BOTTLES DRAWN AEROBIC AND ANAEROBIC Blood Culture adequate volume Performed at  [facility], [address], [place], [place] [address]    Culture   Final    NO GROWTH < 12 HOURS Performed at [facility], [address] [place], [place] [address]    Report Status PENDING  Incomplete         Radiology Studies: DG Chest Port 1 View  Result Date: Jan 15, 2020 CLINICAL DATA:  COVID positive with shortness of breath. EXAM: PORTABLE CHEST 1 VIEW COMPARISON:  January 07, 2020 FINDINGS: Moderate to marked severity bilateral patchy multifocal infiltrates are seen. The heart size and mediastinal contours are within normal limits. Degenerative changes seen throughout the thoracic spine. IMPRESSION: Moderate to marked severity bilateral patchy multifocal infiltrates. Electronically Signed   By: Aram Candela M.D.   On: 01/15/2020 16:59        Scheduled Meds: . albuterol  2 puff Inhalation Q6H  . amLODipine  10 mg Oral Daily  . vitamin C  500 mg Oral Daily  . baricitinib  4 mg Oral Daily  . enoxaparin (LOVENOX) injection  60 mg Subcutaneous Q24H  . insulin aspart  0-9 Units Subcutaneous TID WC  . methylPREDNISolone (SOLU-MEDROL) injection  60 mg Intravenous Q6H  . metoprolol succinate  100 mg Oral Daily  . oxybutynin  5 mg Oral BID  . zinc sulfate  220 mg Oral Daily   Continuous Infusions: . remdesivir 100 mg in NS 100 mL 100 mg (01/15/20 0952)     LOS: 1 day    Time spent: 41 minutes spent on chart review, discussion with nursing staff, consultants, updating family and interview/physical exam; more than 50% of that time was spent in counseling and/or coordination of care.    Alvira Philips [place], DO [facility] Available via Epic secure chat 7am-7pm After these hours, please refer to coverage provider listed on [email or web address] 01/15/2020, 3:01 PM

## 2020-01-15 NOTE — ED Notes (Signed)
Attempted blood draw x2, unable to collect gold top.

## 2020-01-15 NOTE — ED Notes (Signed)
Pt eating lunch tray  

## 2020-01-15 DEATH — deceased

## 2020-01-16 ENCOUNTER — Inpatient Hospital Stay (HOSPITAL_COMMUNITY): Payer: BC Managed Care – PPO

## 2020-01-16 ENCOUNTER — Encounter (HOSPITAL_COMMUNITY): Payer: Self-pay | Admitting: Internal Medicine

## 2020-01-16 DIAGNOSIS — R7989 Other specified abnormal findings of blood chemistry: Secondary | ICD-10-CM

## 2020-01-16 DIAGNOSIS — U071 COVID-19: Secondary | ICD-10-CM

## 2020-01-16 LAB — COMPREHENSIVE METABOLIC PANEL
ALT: 44 U/L (ref 0–44)
AST: 26 U/L (ref 15–41)
Albumin: 2.8 g/dL — ABNORMAL LOW (ref 3.5–5.0)
Alkaline Phosphatase: 65 U/L (ref 38–126)
Anion gap: 10 (ref 5–15)
BUN: 25 mg/dL — ABNORMAL HIGH (ref 8–23)
CO2: 27 mmol/L (ref 22–32)
Calcium: 8.7 mg/dL — ABNORMAL LOW (ref 8.9–10.3)
Chloride: 104 mmol/L (ref 98–111)
Creatinine, Ser: 0.83 mg/dL (ref 0.44–1.00)
GFR calc Af Amer: >60 mL/min (ref >60)
GFR calc non Af Amer: >60 mL/min (ref >60)
Glucose, Bld: 173 mg/dL — ABNORMAL HIGH (ref 70–99)
Potassium: 3.8 mmol/L (ref 3.5–5.1)
Sodium: 141 mmol/L (ref 135–145)
Total Bilirubin: 0.7 mg/dL (ref 0.3–1.2)
Total Protein: 6.6 g/dL (ref 6.5–8.1)

## 2020-01-16 LAB — D-DIMER, QUANTITATIVE: D-Dimer, Quant: 3.77 ug/mL-FEU — ABNORMAL HIGH (ref 0.00–0.50)

## 2020-01-16 LAB — CBC WITH DIFFERENTIAL/PLATELET
Abs Immature Granulocytes: 0.85 10*3/uL — ABNORMAL HIGH (ref 0.00–0.07)
Basophils Absolute: 0 10*3/uL (ref 0.0–0.1)
Basophils Relative: 0 %
Eosinophils Absolute: 0 10*3/uL (ref 0.0–0.5)
Eosinophils Relative: 0 %
HCT: 39.9 % (ref 36.0–46.0)
Hemoglobin: 12.9 g/dL (ref 12.0–15.0)
Immature Granulocytes: 6 %
Lymphocytes Relative: 6 %
Lymphs Abs: 0.8 10*3/uL (ref 0.7–4.0)
MCH: 30 pg (ref 26.0–34.0)
MCHC: 32.3 g/dL (ref 30.0–36.0)
MCV: 92.8 fL (ref 80.0–100.0)
Monocytes Absolute: 0.6 10*3/uL (ref 0.1–1.0)
Monocytes Relative: 5 %
Neutro Abs: 11.4 10*3/uL — ABNORMAL HIGH (ref 1.7–7.7)
Neutrophils Relative %: 83 %
Platelets: 392 10*3/uL (ref 150–400)
RBC: 4.3 MIL/uL (ref 3.87–5.11)
RDW: 14.6 % (ref 11.5–15.5)
WBC: 13.8 10*3/uL — ABNORMAL HIGH (ref 4.0–10.5)
nRBC: 0 % (ref 0.0–0.2)

## 2020-01-16 LAB — MAGNESIUM: Magnesium: 2.3 mg/dL (ref 1.7–2.4)

## 2020-01-16 LAB — C-REACTIVE PROTEIN: CRP: 8.5 mg/dL — ABNORMAL HIGH (ref <1.0)

## 2020-01-16 LAB — PHOSPHORUS: Phosphorus: 3.6 mg/dL (ref 2.5–4.6)

## 2020-01-16 LAB — GLUCOSE, CAPILLARY
Glucose-Capillary: 158 mg/dL — ABNORMAL HIGH (ref 70–99)
Glucose-Capillary: 152 mg/dL — ABNORMAL HIGH (ref 70–99)

## 2020-01-16 LAB — CBG MONITORING, ED
Glucose-Capillary: 164 mg/dL — ABNORMAL HIGH (ref 70–99)
Glucose-Capillary: 146 mg/dL — ABNORMAL HIGH (ref 70–99)

## 2020-01-16 MED ORDER — SODIUM CHLORIDE (PF) 0.9 % IJ SOLN
INTRAMUSCULAR | Status: AC
  Filled 2020-01-16: qty 50

## 2020-01-16 MED ORDER — IOHEXOL 350 MG/ML SOLN
100.0000 mL | Freq: Once | INTRAVENOUS | Status: AC | PRN
  Administered 2020-01-16: 100 mL via INTRAVENOUS

## 2020-01-16 MED ORDER — ONDANSETRON HCL 4 MG/2ML IJ SOLN
4.0000 mg | Freq: Three times a day (TID) | INTRAMUSCULAR | Status: DC | PRN
  Administered 2020-01-17 – 2020-02-03 (×8): 4 mg via INTRAVENOUS
  Filled 2020-01-16 (×9): qty 2

## 2020-01-16 MED ORDER — FUROSEMIDE 10 MG/ML IJ SOLN
40.0000 mg | Freq: Two times a day (BID) | INTRAMUSCULAR | Status: DC
  Administered 2020-01-16 – 2020-01-18 (×4): 40 mg via INTRAVENOUS
  Filled 2020-01-16 (×4): qty 4

## 2020-01-16 NOTE — ED Notes (Signed)
Patient transported to CT 

## 2020-01-16 NOTE — ED Notes (Signed)
Pt moved to hospital bed.  O2 sat dropped to 52% and heart rate increased to 130s.  This is not a change in status per night shift RN.  Pt has non labored breathing and remains A & Ox4.

## 2020-01-16 NOTE — Progress Notes (Signed)
Lower extremity venous has been completed.   Preliminary results in CV Proc.   Blanch Media 01/16/2020 10:46 AM

## 2020-01-16 NOTE — Progress Notes (Signed)
PROGRESS NOTE    Julie Mcguire  OAC:[id number] DOB: [birth year] DOA: January 16, 2020 PCP: Elias Else, MD    Brief Narrative:  Julie Mcguire is a 64 y.o. female with medical history significant of recent Covid pneumonia, hypertension, moderate obesity who presented to the emergency room with complaints of worsening shortness of breath.   Recently discharged from the hospital on 01/10/2020 following treatment for Covid-19 viral pneumonia with steroids, remdesivir, and baricitinib.  She was discharged on 4 L of oxygen per minute. On EMS arrival, she was hypoxic and was saturating 75% on 6 L of nasal cannula.  Saturation good to 85% with 15 L nonrebreather mask.  Patient was then brought to the emergency department.  As expected, patient has not been vaccinated. She she also mentioned that her husband has Covid at home but not sick as her.  She says she is feeling very weak, has some stomach upset but denies having any nausea, vomiting, diarrhea, headache or dysuria.  In the ED, she was tachypneic, tachycardic, hypertensive on arrival.    Lab work showed elevated inflammatory markers with CRP in the range of 12. Chest x-ray showed moderate to marked severe bilateral patchy infiltrates. Patient admitted for the management of severe refractory Covid pneumonia.   Assessment & Plan:   Principal Problem:   Acute respiratory failure with hypoxia (HCC) Active Problems:   Pneumonia due to COVID-19 virus   Essential hypertension   Obesity, Class III, BMI 40-49.9 (morbid obesity) (HCC)   Acute hypoxic respiratory failure secondary to acute Covid-19 viral pneumonia during the ongoing 2020/2021 Covid 19 Pandemic - POA Patient representing to the ED following recent discharge 4 days prior for progressive shortness of breath despite treatment for Covid-19 with remdesivir, baricitinib and steroids.  Was discharged home on 4 L nasal cannula, notably hypoxic requiring 15 L NRB upon ED presentation.  Chest  x-ray with moderate to marked severity bilateral patchy multifocal infiltrates.  Procalcitonin less than 0.10. --COVID test: PCR + 01/07/2020 --CRP 2.2>12.5>8.5 --ddimer 1.06>1.72>3.77 --Remdesivir, plan 5-day course (Day #3/5) --Baricitinib 4mg  PO daily (Day #3/14) --Continue Solumedrol 60 mg IV every 6 hours --Start Lasix 40mg  IV BID --prone for 2-3hrs every 12hrs if able --Continue supplemental oxygen, titrate to maintain SPO2 greater than 92%, currently on 15 L NRB --Continue supportive care with albuterol MDI prn, vitamin C, zinc, Tylenol, antitussives (benzonatate/ Mucinex/Tussionex) --Incentive spirometry, flutter valve --Follow CBC, CMP, D-dimer, ferritin, and CRP daily --Continue airborne/contact isolation precautions for 3 weeks from the day of diagnosis  The treatment plan and use of medications and known side effects were discussed with patient/family. Some of the medications used are based on case reports/anecdotal data.  All other medications being used in the management of COVID-19 based on limited study data.  Complete risks and long-term side effects are unknown, however in the best clinical judgment they seem to be of some benefit.  Patient wanted to proceed with treatment options provided.  Elevated D-dimer --ddimer 1.06>1.72>3.77 --Vascular duplex venous ultrasound bilateral lower extremities negative for DVT --CT angiogram chest: Negative for PE  Essential hypertension BP 114/58 this morning, well controlled. --Continue amlodipine 10 mg p.o. daily --Continue metoprolol succinate 100 mg p.o. daily  Morbid obesity BMI 45.03 kg/m.  Discussed with patient need for aggressive lifestyle changes, weight loss as this complicates all facets of care.   DVT prophylaxis: Lovenox Code Status: Full code Family Communication: Updated patient extensively at bedside, updated patient spouse, via telephone this afternoon  Disposition Plan:  Status is: Inpatient  Remains  inpatient appropriate because:Ongoing diagnostic testing needed not appropriate for outpatient work up, Unsafe d/c plan, IV treatments appropriate due to intensity of illness or inability to take PO and Inpatient level of care appropriate due to severity of illness   Dispo: The patient is from: Home              Anticipated d/c is to: To be determined              Anticipated d/c date is: > 3 days              Patient currently is not medically stable to d/c.   Consultants:   None  Procedures:   None  Antimicrobials:   Remdesivir 8/31>>  Baricitinib 8/31>>   Subjective: Patient seen and examined bedside, resting comfortably but continues with significant dyspnea at rest.  Continues on 15 L high flow nasal cannula. No other complaints or concerns at this time.  Discussed with patient given continued hypoxia and rising D-dimer, will assess for DVT/PE with ultrasound lower extremities and CT angiogram chest.  Denies headache, no dizziness, no fever/chills/night sweats, no nausea cefonicid diarrhea, no chest pain, no palpitations, no abdominal pain.  No acute events overnight per nursing staff.  Objective: Vitals:   01/16/20 1151 01/16/20 1238 01/16/20 1300 01/16/20 1354  BP: (!) 110/56 125/61 (!) 112/51 (!) 112/51  Pulse: 91 89 88 (!) 104  Resp: (!) 21 (!) 21 20 16   Temp:      TempSrc:      SpO2: (!) 82% (!) 79% (!) 84% (!) 82%  Weight:      Height:        Intake/Output Summary (Last 24 hours) at 01/16/2020 1418 Last data filed at 01/15/2020 1852 Gross per 24 hour  Intake 100 ml  Output --  Net 100 ml   Filed Weights   01/15/20 1916  Weight: 119 kg    Examination:  General exam: Appears calm and comfortable  Respiratory system: Decreased breath sounds bilaterally, slight increased work of breathing, on 15 L NRB Cardiovascular system: S1 & S2 heard, RRR. No JVD, murmurs, rubs, gallops or clicks. No pedal edema. Gastrointestinal system: Abdomen is nondistended, soft  and nontender. No organomegaly or masses felt. Normal bowel sounds heard. Central nervous system: Alert and oriented. No focal neurological deficits. Extremities: Symmetric 5 x 5 power. Skin: No rashes, lesions or ulcers Psychiatry: Judgement and insight appear normal. Mood & affect appropriate.     Data Reviewed: I have personally reviewed following labs and imaging studies  CBC: Recent Labs  Lab 01/10/20 0341 01/07/2020 1554 01/15/20 0536 01/16/20 0234  WBC 7.3 20.1* 10.7* 13.8*  NEUTROABS 5.6 16.4* 9.2* 11.4*  HGB 13.0 13.0 12.6 12.9  HCT 40.6 39.9 39.7 39.9  MCV 93.5 93.2 93.9 92.8  PLT 226 467* 447* 392   Basic Metabolic Panel: Recent Labs  Lab 01/10/20 0341 12/15/2019 1554 01/15/20 0536 01/16/20 0234  NA 143 138 138 141  K 4.2 3.6 4.3 3.8  CL 107 98 101 104  CO2 24 27 25 27   GLUCOSE 151* 134* 158* 173*  BUN 41* 12 18 25*  CREATININE 1.23* 0.84 0.73 0.83  CALCIUM 9.2 8.8* 8.6* 8.7*  MG 2.2  --  2.2 2.3  PHOS  --   --  3.9 3.6   GFR: Estimated Creatinine Clearance: 90.3 mL/min (by C-G formula based on SCr of 0.83 mg/dL). Liver Function Tests: Recent Labs  Lab 01/10/20  6962 12/27/2019 1554 01/15/20 0536 01/16/20 0234  AST 73* 29 34 26  ALT 45* 41 42 44  ALKPHOS 62 59 65 65  BILITOT 0.6 0.9 0.7 0.7  PROT 6.8 7.0 6.9 6.6  ALBUMIN 3.5 3.1* 2.9* 2.8*   No results for input(s): LIPASE, AMYLASE in the last 168 hours. No results for input(s): AMMONIA in the last 168 hours. Coagulation Profile: No results for input(s): INR, PROTIME in the last 168 hours. Cardiac Enzymes: No results for input(s): CKTOTAL, CKMB, CKMBINDEX, TROPONINI in the last 168 hours. BNP (last 3 results) No results for input(s): PROBNP in the last 8760 hours. HbA1C: No results for input(s): HGBA1C in the last 72 hours. CBG: Recent Labs  Lab 01/15/20 1159 01/15/20 1709 01/15/20 2234 01/16/20 0756 01/16/20 1141  GLUCAP 136* 165* 180* 164* 146*   Lipid Profile: Recent Labs     12/15/2019 1554  TRIG 120   Thyroid Function Tests: No results for input(s): TSH, T4TOTAL, FREET4, T3FREE, THYROIDAB in the last 72 hours. Anemia Panel: Recent Labs    01/03/2020 1554  FERRITIN 738*   Sepsis Labs: Recent Labs  Lab 01/01/2020 1554  PROCALCITON <0.10  LATICACIDVEN 1.8    Recent Results (from the past 240 hour(s))  Blood Culture (routine x 2)     Status: None   Collection Time: 01/07/20  2:30 PM   Specimen: BLOOD RIGHT HAND  Result Value Ref Range Status   Specimen Description   Final    BLOOD RIGHT HAND Performed at [facility], [address], [place], [place] [address]    Special Requests   Final    BOTTLES DRAWN AEROBIC AND ANAEROBIC Blood Culture adequate volume Performed at [facility], [address], [place], [place] [address]    Culture   Final    NO GROWTH 5 DAYS Performed at [facility], [address], [place], [place] [address]    Report Status 01/12/2020 FINAL  Final  Blood Culture (routine x 2)     Status: None   Collection Time: 01/07/20  3:03 PM   Specimen: BLOOD  Result Value Ref Range Status   Specimen Description   Final    BLOOD RIGHT ANTECUBITAL Performed at [facility], [address], [place], [place] [address]    Special Requests   Final    BOTTLES DRAWN AEROBIC AND ANAEROBIC Blood Culture results may not be optimal due to an excessive volume of blood received in culture bottles Performed at [facility], [address] [place], [place] [address]    Culture   Final    NO GROWTH 5 DAYS Performed at [facility], [address] [place], [place] [address]    Report Status 01/12/2020 FINAL  Final  SARS Coronavirus 2 by RT PCR (hospital order, performed in [facility] hospital lab) Nasopharyngeal Nasopharyngeal Swab     Status: Abnormal   Collection Time: 01/07/20  3:09 PM   Specimen: Nasopharyngeal Swab  Result Value Ref Range Status    SARS Coronavirus 2 POSITIVE (A) NEGATIVE Final    Comment: RESULT CALLED TO, READ BACK BY AND VERIFIED WITH: WEST,S. RN  01/07/20 BILLINGSLEY,L (NOTE) SARS-CoV-2 target nucleic acids are DETECTED  SARS-CoV-2 RNA is generally detectable in upper respiratory specimens  during the acute phase of infection.  Positive results are indicative  of the presence of the identified virus, but do not rule out bacterial infection or co-infection with other pathogens not  detected by the test.  Clinical correlation with patient history and  other diagnostic information is necessary to determine patient infection status.  The expected result is negative.  Fact Sheet for Patients:   BoilerBrush.com.cyhttps://www.fda.gov/media/136312/download   Fact Sheet for Healthcare Providers:   https://pope.com/https://www.fda.gov/media/136313/download    This test is not yet approved or cleared by the [place] FDA and  has been authorized for detection and/or diagnosis of SARS-CoV-2 by FDA under an Emergency Use Authorization (EUA).  This EUA will remain in effect (meaning thi s test can be used) for the duration of  the COVID-19 declaration under Section 564(b)(1) of the Act, 21 U.S.C. section 360-bbb-3(b)(1), unless the authorization is terminated or revoked sooner.  Performed at [facility], [address] [place], [place] [address]   Blood Culture (routine x 2)     Status: None (Preliminary result)   Collection Time: 08/01/2019  3:54 PM   Specimen: BLOOD  Result Value Ref Range Status   Specimen Description   Final    BLOOD RIGHT ANTECUBITAL Performed at [facility], [address] [place], [place] [address]    Special Requests   Final    BOTTLES DRAWN AEROBIC AND ANAEROBIC Blood Culture results may not be optimal due to an inadequate volume of blood received in culture bottles Performed at [facility], [address] [place], [place] [address]    Culture   Final     NO GROWTH 2 DAYS Performed at [facility], [address] [place], [place] [address]    Report Status PENDING  Incomplete  Blood Culture (routine x 2)     Status: Abnormal (Preliminary result)   Collection Time: 08/01/2019  4:00 PM   Specimen: BLOOD LEFT HAND  Result Value Ref Range Status   Specimen Description   Final    BLOOD LEFT HAND Performed at [facility], [address] [place], [place] [address]    Special Requests   Final    BOTTLES DRAWN AEROBIC AND ANAEROBIC Blood Culture adequate volume Performed at [facility], [address] [place], [place] [address]    Culture  Setup Time   Final    GRAM POSITIVE COCCI IN CLUSTERS ANAEROBIC BOTTLE ONLY Organism ID to follow CRITICAL RESULT CALLED TO, READ BACK BY AND VERIFIED WITH: Damaris HippoM LILLISTON [facility] 01/15/20 2154 JDW Performed at [facility], [address] [place], [place] [address]    Culture STAPHYLOCOCCUS EPIDERMIDIS (A)  Final   Report Status PENDING  Incomplete  Blood Culture ID Panel (Reflexed)     Status: Abnormal   Collection Time: 08/01/2019  4:00 PM  Result Value Ref Range Status   Enterococcus faecalis NOT DETECTED NOT DETECTED Final   Enterococcus Faecium NOT DETECTED NOT DETECTED Final   Listeria monocytogenes NOT DETECTED NOT DETECTED Final   Staphylococcus species DETECTED (A) NOT DETECTED Final    Comment: CRITICAL RESULT CALLED TO, READ BACK BY AND VERIFIED WITH: Damaris HippoM LILLISTON [facility] 01/15/20 2154 JDW    Staphylococcus aureus (BCID) NOT DETECTED NOT DETECTED Final   Staphylococcus epidermidis DETECTED (A) NOT DETECTED Final    Comment: CRITICAL RESULT CALLED TO, READ BACK BY AND VERIFIED WITH: Damaris HippoM LILLISTON [facility] 01/15/20 2154 JDW    Staphylococcus lugdunensis NOT DETECTED NOT DETECTED Final   Streptococcus species NOT DETECTED NOT DETECTED Final   Streptococcus agalactiae NOT DETECTED NOT DETECTED Final   Streptococcus pneumoniae NOT DETECTED NOT DETECTED  Final  Streptococcus pyogenes NOT DETECTED NOT DETECTED Final   A.calcoaceticus-baumannii NOT DETECTED NOT DETECTED Final   Bacteroides fragilis NOT DETECTED NOT DETECTED Final   Enterobacterales NOT DETECTED NOT DETECTED Final   Enterobacter cloacae complex NOT DETECTED NOT DETECTED Final   Escherichia coli NOT DETECTED NOT DETECTED Final   Klebsiella aerogenes NOT DETECTED NOT DETECTED Final   Klebsiella oxytoca NOT DETECTED NOT DETECTED Final   Klebsiella pneumoniae NOT DETECTED NOT DETECTED Final   Proteus species NOT DETECTED NOT DETECTED Final   Salmonella species NOT DETECTED NOT DETECTED Final   Serratia marcescens NOT DETECTED NOT DETECTED Final   Haemophilus influenzae NOT DETECTED NOT DETECTED Final   Neisseria meningitidis NOT DETECTED NOT DETECTED Final   Pseudomonas aeruginosa NOT DETECTED NOT DETECTED Final   Stenotrophomonas maltophilia NOT DETECTED NOT DETECTED Final   Candida albicans NOT DETECTED NOT DETECTED Final   Candida auris NOT DETECTED NOT DETECTED Final   Candida glabrata NOT DETECTED NOT DETECTED Final   Candida krusei NOT DETECTED NOT DETECTED Final   Candida parapsilosis NOT DETECTED NOT DETECTED Final   Candida tropicalis NOT DETECTED NOT DETECTED Final   Cryptococcus neoformans/gattii NOT DETECTED NOT DETECTED Final   Methicillin resistance mecA/C NOT DETECTED NOT DETECTED Final    Comment: Performed at [facility], [address], [place], [place] [address]         Radiology Studies: DG Chest Port 1 View  Result Date: 02-09-20 CLINICAL DATA:  COVID positive with shortness of breath. EXAM: PORTABLE CHEST 1 VIEW COMPARISON:  January 07, 2020 FINDINGS: Moderate to marked severity bilateral patchy multifocal infiltrates are seen. The heart size and mediastinal contours are within normal limits. Degenerative changes seen throughout the thoracic spine. IMPRESSION: Moderate to marked severity bilateral patchy multifocal infiltrates.  Electronically Signed   By: Aram Candela M.D.   On: February 09, 2020 16:59   VAS [place] LOWER EXTREMITY VENOUS (DVT)  Result Date: 01/16/2020  Lower Venous DVTStudy Indications: Elevated ddimer, covid +.  Performing Technologist: Blanch Media RVS  Examination Guidelines: A complete evaluation includes B-mode imaging, spectral Doppler, color Doppler, and power Doppler as needed of all accessible portions of each vessel. Bilateral testing is considered an integral part of a complete examination. Limited examinations for reoccurring indications may be performed as noted. The reflux portion of the exam is performed with the patient in reverse Trendelenburg.  +---------+---------------+---------+-----------+----------+--------------+ RIGHT    CompressibilityPhasicitySpontaneityPropertiesThrombus Aging +---------+---------------+---------+-----------+----------+--------------+ CFV      Full           Yes      Yes                                 +---------+---------------+---------+-----------+----------+--------------+ SFJ      Full                                                        +---------+---------------+---------+-----------+----------+--------------+ FV Prox  Full                                                        +---------+---------------+---------+-----------+----------+--------------+ FV Mid  Full                                                        +---------+---------------+---------+-----------+----------+--------------+ FV DistalFull                                                        +---------+---------------+---------+-----------+----------+--------------+ PFV      Full                                                        +---------+---------------+---------+-----------+----------+--------------+ POP      Full           Yes      Yes                                  +---------+---------------+---------+-----------+----------+--------------+ PTV      Full                                                        +---------+---------------+---------+-----------+----------+--------------+ PERO     Full                                                        +---------+---------------+---------+-----------+----------+--------------+   +---------+---------------+---------+-----------+----------+--------------+ LEFT     CompressibilityPhasicitySpontaneityPropertiesThrombus Aging +---------+---------------+---------+-----------+----------+--------------+ CFV      Full           Yes      Yes                                 +---------+---------------+---------+-----------+----------+--------------+ SFJ      Full                                                        +---------+---------------+---------+-----------+----------+--------------+ FV Prox  Full                                                        +---------+---------------+---------+-----------+----------+--------------+ FV Mid                  Yes      Yes                                 +---------+---------------+---------+-----------+----------+--------------+  FV Distal               Yes      Yes                                 +---------+---------------+---------+-----------+----------+--------------+ PFV      Full                                                        +---------+---------------+---------+-----------+----------+--------------+ POP      Full           Yes      Yes                                 +---------+---------------+---------+-----------+----------+--------------+ PTV      Full                                                        +---------+---------------+---------+-----------+----------+--------------+ PERO     Full                                                         +---------+---------------+---------+-----------+----------+--------------+     Summary: BILATERAL: - No evidence of deep vein thrombosis seen in the lower extremities, bilaterally. - No evidence of superficial venous thrombosis in the lower extremities, bilaterally. -   *See table(s) above for measurements and observations.    Preliminary         Scheduled Meds: . albuterol  2 puff Inhalation Q6H  . amLODipine  10 mg Oral Daily  . vitamin C  500 mg Oral Daily  . baricitinib  4 mg Oral Daily  . enoxaparin (LOVENOX) injection  60 mg Subcutaneous Q24H  . insulin aspart  0-9 Units Subcutaneous TID WC  . methylPREDNISolone (SOLU-MEDROL) injection  60 mg Intravenous Q6H  . metoprolol succinate  100 mg Oral Daily  . oxybutynin  5 mg Oral BID  . sodium chloride (PF)      . zinc sulfate  220 mg Oral Daily   Continuous Infusions: . remdesivir 100 mg in NS 100 mL Stopped (01/16/20 1409)     LOS: 2 days    Time spent: 40 minutes spent on chart review, discussion with nursing staff, consultants, updating family and interview/physical exam; more than 50% of that time was spent in counseling and/or coordination of care.    Alvira Philips [place], DO [facility] Available via Epic secure chat 7am-7pm After these hours, please refer to coverage provider listed on [email or web address] 01/16/2020, 2:18 PM

## 2020-01-16 NOTE — TOC Progression Note (Signed)
Transition of Care [facility]) - Progression Note    Patient Details  Name: Julie Mcguire MRN: [id number] Date of Birth: 01-Dec-1955  [facility] of [facility]) CM/SW Contact  Wynona Duhamel C Tarpley-Carter, LCSWA Phone Number: 01/16/2020, 1:00 PM  Clinical Narrative:     [facility] CSW consult was received.  Application for Remote Health has been started by CSW.  CSW will continue to follow for dc needs.  Divina Neale Tarpley-Carter, MSW, LCSW-A                  [facility] ED Transitions of CareClinical Social Worker [email or web address] [phone]       Expected Discharge Plan and Services  Pt dc'd with Remote Health for continued care management.                                               Social Determinants of Health (SDOH) Interventions    Readmission Risk Interventions No flowsheet data found.

## 2020-01-16 NOTE — Progress Notes (Signed)
RT placed patient on HHFNC at 35 Liters and 100%. Patient is tolerating well

## 2020-01-17 LAB — CBC WITH DIFFERENTIAL/PLATELET
Abs Immature Granulocytes: 0.73 10*3/uL — ABNORMAL HIGH (ref 0.00–0.07)
Basophils Absolute: 0 10*3/uL (ref 0.0–0.1)
Basophils Relative: 0 %
Eosinophils Absolute: 0 10*3/uL (ref 0.0–0.5)
Eosinophils Relative: 0 %
HCT: 41 % (ref 36.0–46.0)
Hemoglobin: 12.9 g/dL (ref 12.0–15.0)
Immature Granulocytes: 5 %
Lymphocytes Relative: 4 %
Lymphs Abs: 0.6 10*3/uL — ABNORMAL LOW (ref 0.7–4.0)
MCH: 30.1 pg (ref 26.0–34.0)
MCHC: 31.5 g/dL (ref 30.0–36.0)
MCV: 95.6 fL (ref 80.0–100.0)
Monocytes Absolute: 0.9 10*3/uL (ref 0.1–1.0)
Monocytes Relative: 6 %
Neutro Abs: 12.7 10*3/uL — ABNORMAL HIGH (ref 1.7–7.7)
Neutrophils Relative %: 85 %
Platelets: 321 10*3/uL (ref 150–400)
RBC: 4.29 MIL/uL (ref 3.87–5.11)
RDW: 14.5 % (ref 11.5–15.5)
WBC: 15 10*3/uL — ABNORMAL HIGH (ref 4.0–10.5)
nRBC: 0 % (ref 0.0–0.2)

## 2020-01-17 LAB — COMPREHENSIVE METABOLIC PANEL
ALT: 34 U/L (ref 0–44)
AST: 18 U/L (ref 15–41)
Albumin: 2.8 g/dL — ABNORMAL LOW (ref 3.5–5.0)
Alkaline Phosphatase: 57 U/L (ref 38–126)
Anion gap: 12 (ref 5–15)
BUN: 41 mg/dL — ABNORMAL HIGH (ref 8–23)
CO2: 29 mmol/L (ref 22–32)
Calcium: 8.6 mg/dL — ABNORMAL LOW (ref 8.9–10.3)
Chloride: 103 mmol/L (ref 98–111)
Creatinine, Ser: 1.06 mg/dL — ABNORMAL HIGH (ref 0.44–1.00)
GFR calc Af Amer: >60 mL/min (ref >60)
GFR calc non Af Amer: 57 mL/min — ABNORMAL LOW (ref >60)
Glucose, Bld: 163 mg/dL — ABNORMAL HIGH (ref 70–99)
Potassium: 3.7 mmol/L (ref 3.5–5.1)
Sodium: 144 mmol/L (ref 135–145)
Total Bilirubin: 0.5 mg/dL (ref 0.3–1.2)
Total Protein: 6.1 g/dL — ABNORMAL LOW (ref 6.5–8.1)

## 2020-01-17 LAB — GLUCOSE, CAPILLARY
Glucose-Capillary: 144 mg/dL — ABNORMAL HIGH (ref 70–99)
Glucose-Capillary: 285 mg/dL — ABNORMAL HIGH (ref 70–99)
Glucose-Capillary: 168 mg/dL — ABNORMAL HIGH (ref 70–99)
Glucose-Capillary: 132 mg/dL — ABNORMAL HIGH (ref 70–99)

## 2020-01-17 LAB — CULTURE, BLOOD (ROUTINE X 2): Special Requests: ADEQUATE

## 2020-01-17 LAB — C-REACTIVE PROTEIN: CRP: 3 mg/dL — ABNORMAL HIGH (ref <1.0)

## 2020-01-17 LAB — MAGNESIUM: Magnesium: 2.4 mg/dL (ref 1.7–2.4)

## 2020-01-17 LAB — PHOSPHORUS: Phosphorus: 4.4 mg/dL (ref 2.5–4.6)

## 2020-01-17 LAB — D-DIMER, QUANTITATIVE: D-Dimer, Quant: 3.27 ug/mL-FEU — ABNORMAL HIGH (ref 0.00–0.50)

## 2020-01-17 MED ORDER — LORAZEPAM 0.5 MG PO TABS
0.5000 mg | ORAL_TABLET | Freq: Four times a day (QID) | ORAL | Status: DC | PRN
  Administered 2020-01-17 – 2020-01-20 (×8): 0.5 mg via ORAL
  Filled 2020-01-17 (×9): qty 1

## 2020-01-17 MED ORDER — HYDROXYZINE HCL 25 MG PO TABS
25.0000 mg | ORAL_TABLET | Freq: Once | ORAL | Status: AC
  Administered 2020-01-17: 25 mg via ORAL
  Filled 2020-01-17: qty 1

## 2020-01-17 MED ORDER — TEMAZEPAM 7.5 MG PO CAPS
25.0000 mg | ORAL_CAPSULE | Freq: Every evening | ORAL | Status: DC | PRN
  Administered 2020-01-17 – 2020-01-21 (×4): 22.5 mg via ORAL
  Filled 2020-01-17 (×4): qty 1

## 2020-01-17 NOTE — Evaluation (Signed)
Physical Therapy Evaluation Patient Details Name: Julie Mcguire MRN: [id number] DOB: 1955-05-21 Today's Date: 01/17/2020   History of Present Illness  Julie Mcguire is a 64 y.o. female with medical history significant of recent Covid pneumonia, hypertension, moderate obesity who presented to the emergency room with complaints of worsening shortness of breath.  Recently discharged from the hospital on 01/10/2020 following treatment for Covid-19 viral pneumonia.She was discharged on 4 L of oxygen and returned with worsening short of breath.  Clinical Impression  Pt admitted with above diagnosis.  Pt currently with functional limitations due to the deficits listed below (see PT Problem List). Pt will benefit from skilled PT to increase their independence and safety with mobility to allow discharge to the venue listed below.  Pt able to sit EOB however fatigued quickly.  Spo2 also dropped and pt required partial NRB for recovery.  Pt returned to left sidelying end of session back to [facility] only and SPO2 92% upon leaving room.       Follow Up Recommendations Home health PT;Supervision for mobility/OOB    Equipment Recommendations  None recommended by PT    Recommendations for Other Services       Precautions / Restrictions Precautions Precautions: Fall Precaution Comments: Monitor O2 sats Restrictions Weight Bearing Restrictions: No      Mobility  Bed Mobility Overal bed mobility: Needs Assistance Bed Mobility: Rolling;Sidelying to Sit;Sit to Sidelying Rolling: Supervision Sidelying to sit: Supervision   Sit to supine: Mod assist Sit to sidelying: Supervision General bed mobility comments: increased time and effort, pt utilized bed rail; Spo2 dropped to 79% upon sitting EOB on [facility] 40 L/min FiO2 100 so applied partial NRB for recovery and SPO2 improved to 94%  Transfers Overall transfer level: Needs assistance Equipment used: Rolling walker (2 wheeled) Transfers: Sit to/from  [employer] Sit to Stand: Min guard Stand pivot transfers: Min guard       General transfer comment: Min guard to stand from recliner. verbal cue to push up from chair. min guard to take steps to head of bed. o2 sat dropped to 87% initially with transfer to side of bed.  Ambulation/Gait                Stairs            Wheelchair Mobility    Modified Rankin (Stroke Patients Only)       Balance Overall balance assessment: Needs assistance Sitting-balance support: No upper extremity supported;Feet supported Sitting balance-Leahy Scale: Good                                       Pertinent Vitals/Pain Pain Assessment: No/denies pain    Home Living Family/patient expects to be discharged to:: Private residence Living Arrangements: Spouse/significant other;Children Available Help at Discharge: Family;Available 24 hours/day Type of Home: House Home Access: Stairs to enter   [employer] of Steps: 1 Home Layout: One level Home Equipment: Walker - 2 wheels      Prior Function Level of Independence: Needs assistance   Gait / Transfers Assistance Needed: Independent prior to COVID. On discharge walking without walker, short distance to bathroom, reported it was difficult. Predominantly residing in Child psychotherapist bedroom.  ADL's / Homemaking Assistance Needed: Independent prior to COVID, recently retired. Reports needing assistance with ADLs due to difficulty since discharge.        Hand Dominance   Dominant  Hand: Right    Extremity/Trunk Assessment   Upper Extremity Assessment Upper Extremity Assessment: Generalized weakness (Functional ROM, generalized weakness)    Lower Extremity Assessment Lower Extremity Assessment: Generalized weakness    Cervical / Trunk Assessment Cervical / Trunk Assessment: Normal  Communication   Communication: No difficulties  Cognition Arousal/Alertness: Awake/alert Behavior During  Therapy: Flat affect Overall Cognitive Status: Within Functional Limits for tasks assessed                                 General Comments: Rn reported anxiety prior to evaluation and had administered medication. Anxiety Improved during OT eval.      General Comments      Exercises     Assessment/Plan    PT Assessment Patient needs continued PT services  PT Problem List Decreased activity tolerance;Decreased strength;Decreased mobility;Decreased knowledge of use of DME;Cardiopulmonary status limiting activity       PT Treatment Interventions DME instruction;Gait training;Therapeutic exercise;Functional mobility training;Therapeutic activities;Patient/family education    PT Goals (Current goals can be found in the Care Plan section)  Acute Rehab PT Goals Patient Stated Goal: To improve endurance PT Goal Formulation: With patient Time For Goal Achievement: 01/31/20 Potential to Achieve Goals: Good    Frequency Min 3X/week   Barriers to discharge        Co-evaluation               AM-PAC PT "6 Clicks" Mobility  Outcome Measure Help needed turning from your back to your side while in a flat bed without using bedrails?: A Little Help needed moving from lying on your back to sitting on the side of a flat bed without using bedrails?: A Little Help needed moving to and from a bed to a chair (including a wheelchair)?: A Little Help needed standing up from a chair using your arms (e.g., wheelchair or bedside chair)?: A Little Help needed to walk in hospital room?: A Little Help needed climbing 3-5 steps with a railing? : A Lot 6 Click Score: 17    End of Session Equipment Utilized During Treatment: Oxygen Activity Tolerance: Patient limited by fatigue Patient left: in bed;with call bell/phone within reach Nurse Communication: Mobility status PT Visit Diagnosis: Other abnormalities of gait and mobility (R26.89)    Time: 6387-5643 PT Time Calculation  (min) (ACUTE ONLY): 13 min   Charges:   PT Evaluation $PT Eval Low Complexity: 1 Low        Kati PT, DPT Acute Rehabilitation Services Pager: [phone] Office: [phone]  Maida Sale E 01/17/2020, 3:45 PM

## 2020-01-17 NOTE — Plan of Care (Signed)
Patient up to chair, oxygen saturation drops into the 70's with exertion, patient able to rebound back to high 80's low 90's on heated high flow.  Ativan administered x 1 this shift for anxiety with much improvement.

## 2020-01-17 NOTE — Plan of Care (Signed)
°  Problem: Health Behavior/Discharge Planning: Goal: Ability to manage health-related needs will improve Outcome: Progressing   Problem: Clinical Measurements: Goal: Diagnostic test results will improve Outcome: Progressing Goal: Respiratory complications will improve Outcome: Progressing   Problem: Activity: Goal: Risk for activity intolerance will decrease Outcome: Progressing   Problem: Nutrition: Goal: Adequate nutrition will be maintained Outcome: Progressing   Problem: Coping: Goal: Level of anxiety will decrease Outcome: Progressing   Problem: Safety: Goal: Ability to remain free from injury will improve Outcome: Progressing   Problem: Skin Integrity: Goal: Risk for impaired skin integrity will decrease Outcome: Progressing

## 2020-01-17 NOTE — Evaluation (Signed)
Occupational Therapy Evaluation Patient Details Name: Julie Mcguire MRN: [id number] DOB: April 05, 1956 Today's Date: 01/17/2020    History of Present Illness Julie Mcguire is a 64 y.o. female with medical history significant of recent Covid pneumonia, hypertension, moderate obesity who presented to the emergency room with complaints of worsening shortness of breath.  Recently discharged from the hospital on 01/10/2020 following treatment for Covid-19 viral pneumonia.She was discharged on 4 L of oxygen and returned with worsening short of breath.   Clinical Impression   [name] is a 63 year old woman admitted to hospital with worsening shortness of breath and recent history of COVID pneumonia. Patient presents on 40 Liters 100% Fio2 satting at 92%. On evaluation patient presents with decreased strength and poor activity tolerance resulting in impaired ability to perform independent mobility and ADLs. Patient set up for UB ADLs and max assist for LB ADLs, min guard for standing and pivot back to bed. Mod assist for LEs to return to supine. Patient's o2 sat dropped to 87% with stand pivot and then to 75% with return to supine. Patient needed NRB to recover to 92% - but recovered within several minutes. Patient will benefit from skilled OT services to improve deficits, learn compensatory strategies as needed and education on energy conservation principles in order to improve functional abilities. At this time due to patient's deficits and significant oxygenation needs recommend short term rehab at discharge.      Follow Up Recommendations  SNF    Equipment Recommendations  Tub/shower seat;3 in 1 bedside commode    Recommendations for Other Services       Precautions / Restrictions Precautions Precautions: Fall Precaution Comments: Monitor o2 sats Restrictions Weight Bearing Restrictions: No      Mobility Bed Mobility Overal bed mobility: Needs Assistance Bed Mobility: Sit to  Supine       Sit to supine: Mod assist   General bed mobility comments: Mod assist for LEs, Assitance with lines and leads. O2 sat dropped to 75 on 40L.100 Fio2. Used NRB to recover to 92.  Transfers Overall transfer level: Needs assistance Equipment used: Rolling walker (2 wheeled) Transfers: Sit to/from [employer] Sit to Stand: Min guard Stand pivot transfers: Min guard       General transfer comment: Min guard to stand from recliner. verbal cue to push up from chair. min guard to take steps to head of bed. o2 sat dropped to 87% initially with transfer to side of bed.    Balance Overall balance assessment: Mild deficits observed, not formally tested                                         ADL either performed or assessed with clinical judgement   ADL Overall ADL's : Needs assistance/impaired Eating/Feeding: Set up;Sitting   Grooming: Set up;Sitting   Upper Body Bathing: Set up;Sitting   Lower Body Bathing: Maximal assistance;Sit to/from stand;Set up;Min guard   Upper Body Dressing : Set up;Sitting   Lower Body Dressing: Maximal assistance;Set up;Min guard;Sit to/from stand   Toilet Transfer: RW;Stand-pivot;BSC;Min guard   Toileting- Clothing Manipulation and Hygiene: Maximal assistance;Sit to/from stand               Vision   Vision Assessment?: No apparent visual deficits     Perception     Praxis      Pertinent Vitals/Pain Pain  Assessment: No/denies pain     Hand Dominance Right   Extremity/Trunk Assessment Upper Extremity Assessment Upper Extremity Assessment: Generalized weakness (Functional ROM, generalized weakness)   Lower Extremity Assessment Lower Extremity Assessment: Defer to PT evaluation   Cervical / Trunk Assessment Cervical / Trunk Assessment: Normal   Communication Communication Communication: No difficulties   Cognition Arousal/Alertness: Awake/alert Behavior During Therapy: WFL for tasks  assessed/performed Overall Cognitive Status: Within Functional Limits for tasks assessed                                 General Comments: Rn reported anxiety prior to evaluation and had administered medication. Anxiety Improved during OT eval.   General Comments       Exercises     Shoulder Instructions      Home Living Family/patient expects to be discharged to:: Private residence Living Arrangements: Spouse/significant other;Children Available Help at Discharge: Family;Available 24 hours/day Type of Home: House Home Access: Stairs to enter [employer] of Steps: 1   Home Layout: One level     Bathroom Shower/Tub: Producer, television/film/video: Standard (reports one bathroom has a higher toilet) Bathroom Accessibility: Yes   Home Equipment: Walker - 2 wheels          Prior Functioning/Environment Level of Independence: Needs assistance  Gait / Transfers Assistance Needed: Independent prior to COVID. On discharge walking without walker, short distance to bathroom, reported it was difficult. Predominantly residing in Child psychotherapist bedroom. ADL's / Homemaking Assistance Needed: Independent prior to COVID, recently retired. Reports needing assistance with ADLs due to difficulty since discharge.            OT Problem List: Decreased activity tolerance;Decreased strength;Decreased knowledge of use of DME or AE;Cardiopulmonary status limiting activity;Obesity      OT Treatment/Interventions: Self-care/ADL training;Therapeutic exercise;Energy conservation;DME and/or AE instruction;Therapeutic activities;Patient/family education    OT Goals(Current goals can be found in the care plan section) Acute Rehab OT Goals Patient Stated Goal: To improve endurance OT Goal Formulation: With patient Time For Goal Achievement: 01/31/20 Potential to Achieve Goals: Good  OT Frequency: Min 2X/week   Barriers to D/C:            Co-evaluation               AM-PAC OT "6 Clicks" Daily Activity     Outcome Measure Help from another person eating meals?: A Little Help from another person taking care of personal grooming?: A Little Help from another person toileting, which includes using toliet, bedpan, or urinal?: A Lot Help from another person bathing (including washing, rinsing, drying)?: A Lot Help from another person to put on and taking off regular upper body clothing?: A Little Help from another person to put on and taking off regular lower body clothing?: A Lot 6 Click Score: 15   End of Session Equipment Utilized During Treatment: Rolling walker;Oxygen Nurse Communication: Mobility status (o2 sat, improved anxiety)  Activity Tolerance: Patient limited by fatigue Patient left: in bed;with call bell/phone within reach;with bed alarm set  OT Visit Diagnosis: Muscle weakness (generalized) (M62.81)                Time: 3244-0102 OT Time Calculation (min): 19 min Charges:  OT General Charges $OT Visit: 1 Visit OT Evaluation $OT Eval Moderate Complexity: 1 Mod  Javontay Vandam, OTR/L Acute Care Rehab Services  Office [phone] Pager: [phone]   Kelli Churn 01/17/2020,  1:13 PM

## 2020-01-17 NOTE — Progress Notes (Signed)
PROGRESS NOTE    Julie Mcguire  ZSW:[id number] DOB: 1955/08/31 DOA: 12/15/2019 PCP: Elias Else, MD    Brief Narrative:  Julie Mcguire is a 64 y.o. female with medical history significant of recent Covid pneumonia, hypertension, moderate obesity who presented to the emergency room with complaints of worsening shortness of breath.   Recently discharged from the hospital on 01/10/2020 following treatment for Covid-19 viral pneumonia with steroids, remdesivir, and baricitinib.  She was discharged on 4 L of oxygen per minute. On EMS arrival, she was hypoxic and was saturating 75% on 6 L of nasal cannula.  Saturation good to 85% with 15 L nonrebreather mask.  Patient was then brought to the emergency department.  As expected, patient has not been vaccinated. She she also mentioned that her husband has Covid at home but not sick as her.  She says she is feeling very weak, has some stomach upset but denies having any nausea, vomiting, diarrhea, headache or dysuria.  In the ED, she was tachypneic, tachycardic, hypertensive on arrival.    Lab work showed elevated inflammatory markers with CRP in the range of 12. Chest x-ray showed moderate to marked severe bilateral patchy infiltrates. Patient admitted for the management of severe refractory Covid pneumonia.   Assessment & Plan:   Principal Problem:   Acute respiratory failure with hypoxia (HCC) Active Problems:   Pneumonia due to COVID-19 virus   Essential hypertension   Obesity, Class III, BMI 40-49.9 (morbid obesity) (HCC)   Acute hypoxic respiratory failure secondary to acute Covid-19 viral pneumonia during the ongoing 2020/2021 Covid 19 Pandemic - POA Patient representing to the ED following recent discharge 4 days prior for progressive shortness of breath despite treatment for Covid-19 with remdesivir, baricitinib and steroids.  Was discharged home on 4 L nasal cannula, notably hypoxic requiring 15 L NRB upon ED presentation.  Chest  x-ray with moderate to marked severity bilateral patchy multifocal infiltrates.  Procalcitonin less than 0.10. --COVID test: PCR + 01/07/2020 --CRP 2.2>12.5>8.5>3.0 --ddimer 1.06>1.72>3.77>3.27 --Remdesivir, plan 5-day course (Day #4/5) --Baricitinib 4mg  PO daily (Day #4/14) --Solumedrol 60 mg IV every 6 hours --Lasix 40mg  IV BID --prone for 2-3hrs every 12hrs if able --Continue supplemental oxygen, titrate to maintain SPO2 greater than 92%, currently on 40L HFNC/NRB; up from 15L yesterday --Continue supportive care with albuterol MDI prn, vitamin C, zinc, Tylenol, antitussives (benzonatate/ Mucinex/Tussionex) --Incentive spirometry, flutter valve --Follow CBC, CMP, D-dimer, ferritin, and CRP daily --Continue airborne/contact isolation precautions for 3 weeks from the day of diagnosis  The treatment plan and use of medications and known side effects were discussed with patient/family. Some of the medications used are based on case reports/anecdotal data.  All other medications being used in the management of COVID-19 based on limited study data.  Complete risks and long-term side effects are unknown, however in the best clinical judgment they seem to be of some benefit.  Patient wanted to proceed with treatment options provided.  Elevated D-dimer --ddimer 1.06>1.72>3.77>3.27 --Vascular duplex venous ultrasound bilateral lower extremities negative for DVT --CT angiogram chest: Negative for PE  Essential hypertension BP 114/58 this morning, well controlled. --Continue amlodipine 10 mg p.o. daily --Continue metoprolol succinate 100 mg p.o. daily  Morbid obesity BMI 45.03 kg/m.  Discussed with patient need for aggressive lifestyle changes, weight loss as this complicates all facets of care.   DVT prophylaxis: Lovenox Code Status: Full code Family Communication: Updated patient extensively at bedside, updated patient spouse, [name] via telephone this morning  Disposition Plan:  Status  is: Inpatient  Remains inpatient appropriate because:Ongoing diagnostic testing needed not appropriate for outpatient work up, Unsafe d/c plan, IV treatments appropriate due to intensity of illness or inability to take PO and Inpatient level of care appropriate due to severity of illness continues on higher FiO2 requirements, now up to 40 L HFNC/NRB, prognosis guarded   Dispo: The patient is from: Home              Anticipated d/c is to: To be determined              Anticipated d/c date is: > 3 days              Patient currently is not medically stable to d/c.   Consultants:   None  Procedures:   None  Antimicrobials:   Remdesivir 8/31>>  Baricitinib 8/31>>   Subjective: Patient seen and examined bedside, anxious and restless.  Patient's O2 requirement has increased from 15 L to 40 L high flow nasal cannula/NRB today.  Discussed with patient no blood clots/pulmonary embolism noted on vascular ultrasound or CT angiogram chest.  No other complaints or concerns at this time. Denies headache, no dizziness, no fever/chills/night sweats, no nausea/vomiting/diarrhea, no chest pain, no palpitations, no abdominal pain.  No other acute events overnight per nursing staff.  Objective: Vitals:   01/17/20 0336 01/17/20 0601 01/17/20 0809 01/17/20 0921  BP:  135/61    Pulse: 81 74  81  Resp: (!) 24 20  20   Temp:  99.2 F (37.3 C)    TempSrc:  Oral    SpO2: (!) 83% (!) 86% (!) 89% 91%  Weight:      Height:        Intake/Output Summary (Last 24 hours) at 01/17/2020 1122 Last data filed at 01/17/2020 1120 Gross per 24 hour  Intake 0 ml  Output 1550 ml  Net -1550 ml   Filed Weights   01/15/20 1916  Weight: 119 kg    Examination:  General exam: Appears anxious/restless Respiratory system: Decreased breath sounds bilaterally, slight increased work of breathing, on 40L HFNC/NRB Cardiovascular system: S1 & S2 heard, RRR. No JVD, murmurs, rubs, gallops or clicks. No pedal  edema. Gastrointestinal system: Abdomen is nondistended, soft and nontender. No organomegaly or masses felt. Normal bowel sounds heard. Central nervous system: Alert and oriented. No focal neurological deficits. Extremities: Symmetric 5 x 5 power. Skin: No rashes, lesions or ulcers Psychiatry: Judgement and insight appear normal.  Anxious    Data Reviewed: I have personally reviewed following labs and imaging studies  CBC: Recent Labs  Lab 01/05/2020 1554 01/15/20 0536 01/16/20 0234 01/17/20 0431  WBC 20.1* 10.7* 13.8* 15.0*  NEUTROABS 16.4* 9.2* 11.4* 12.7*  HGB 13.0 12.6 12.9 12.9  HCT 39.9 39.7 39.9 41.0  MCV 93.2 93.9 92.8 95.6  PLT 467* 447* 392 321   Basic Metabolic Panel: Recent Labs  Lab 01/08/2020 1554 01/15/20 0536 01/16/20 0234 01/17/20 0431  NA 138 138 141 144  K 3.6 4.3 3.8 3.7  CL 98 101 104 103  CO2 27 25 27 29   GLUCOSE 134* 158* 173* 163*  BUN 12 18 25* 41*  CREATININE 0.84 0.73 0.83 1.06*  CALCIUM 8.8* 8.6* 8.7* 8.6*  MG  --  2.2 2.3 2.4  PHOS  --  3.9 3.6 4.4   GFR: Estimated Creatinine Clearance: 70.7 mL/min (A) (by C-G formula based on SCr of 1.06 mg/dL (H)). Liver Function Tests: Recent Labs  Lab 01/06/2020 1554  01/15/20 0536 01/16/20 0234 01/17/20 0431  AST 29 34 26 18  ALT 41 42 44 34  ALKPHOS 59 65 65 57  BILITOT 0.9 0.7 0.7 0.5  PROT 7.0 6.9 6.6 6.1*  ALBUMIN 3.1* 2.9* 2.8* 2.8*   No results for input(s): LIPASE, AMYLASE in the last 168 hours. No results for input(s): AMMONIA in the last 168 hours. Coagulation Profile: No results for input(s): INR, PROTIME in the last 168 hours. Cardiac Enzymes: No results for input(s): CKTOTAL, CKMB, CKMBINDEX, TROPONINI in the last 168 hours. BNP (last 3 results) No results for input(s): PROBNP in the last 8760 hours. HbA1C: No results for input(s): HGBA1C in the last 72 hours. CBG: Recent Labs  Lab 01/16/20 0756 01/16/20 1141 01/16/20 1706 01/16/20 2209 01/17/20 0754  GLUCAP 164* 146*  158* 152* 144*   Lipid Profile: Recent Labs    22-Jan-2020 1554  TRIG 120   Thyroid Function Tests: No results for input(s): TSH, T4TOTAL, FREET4, T3FREE, THYROIDAB in the last 72 hours. Anemia Panel: Recent Labs    01-22-2020 1554  FERRITIN 738*   Sepsis Labs: Recent Labs  Lab 01/22/20 1554  PROCALCITON <0.10  LATICACIDVEN 1.8    Recent Results (from the past 240 hour(s))  Blood Culture (routine x 2)     Status: None   Collection Time: 01/07/20  2:30 PM   Specimen: BLOOD RIGHT HAND  Result Value Ref Range Status   Specimen Description   Final    BLOOD RIGHT HAND Performed at [facility], [address] [place], [place] [address]    Special Requests   Final    BOTTLES DRAWN AEROBIC AND ANAEROBIC Blood Culture adequate volume Performed at [facility], [address] [place], [place] [address]    Culture   Final    NO GROWTH 5 DAYS Performed at [facility], [address] [place], [place] [address]    Report Status 01/12/2020 FINAL  Final  Blood Culture (routine x 2)     Status: None   Collection Time: 01/07/20  3:03 PM   Specimen: BLOOD  Result Value Ref Range Status   Specimen Description   Final    BLOOD RIGHT ANTECUBITAL Performed at [facility], [address], [place], [place] [address]    Special Requests   Final    BOTTLES DRAWN AEROBIC AND ANAEROBIC Blood Culture results may not be optimal due to an excessive volume of blood received in culture bottles Performed at [facility], [address] [place], [place] [address]    Culture   Final    NO GROWTH 5 DAYS Performed at [facility], [address] [place], [place] [address]    Report Status 01/12/2020 FINAL  Final  SARS Coronavirus 2 by RT PCR (hospital order, performed in [facility] hospital lab) Nasopharyngeal Nasopharyngeal Swab     Status: Abnormal   Collection Time: 01/07/20  3:09 PM   Specimen:  Nasopharyngeal Swab  Result Value Ref Range Status   SARS Coronavirus 2 POSITIVE (A) NEGATIVE Final    Comment: RESULT CALLED TO, READ BACK BY AND VERIFIED WITH: WEST,S. RN @1816  01/07/20 BILLINGSLEY,L (NOTE) SARS-CoV-2 target nucleic acids are DETECTED  SARS-CoV-2 RNA is generally detectable in upper respiratory specimens  during the acute phase of infection.  Positive results are indicative  of the presence of the identified virus, but do not rule out bacterial infection or co-infection with other pathogens not detected  by the test.  Clinical correlation with patient history and  other diagnostic information is necessary to determine patient infection status.  The expected result is negative.  Fact Sheet for Patients:   BoilerBrush.com.cy   Fact Sheet for Healthcare Providers:   https://pope.com/    This test is not yet approved or cleared by the [place] FDA and  has been authorized for detection and/or diagnosis of SARS-CoV-2 by FDA under an Emergency Use Authorization (EUA).  This EUA will remain in effect (meaning thi s test can be used) for the duration of  the COVID-19 declaration under Section 564(b)(1) of the Act, 21 U.S.C. section 360-bbb-3(b)(1), unless the authorization is terminated or revoked sooner.  Performed at [facility], [address], [place], [place] [address]   Blood Culture (routine x 2)     Status: None (Preliminary result)   Collection Time: 12/30/2019  3:54 PM   Specimen: BLOOD  Result Value Ref Range Status   Specimen Description   Final    BLOOD RIGHT ANTECUBITAL Performed at [facility], [address], [place], [place] [address]    Special Requests   Final    BOTTLES DRAWN AEROBIC AND ANAEROBIC Blood Culture results may not be optimal due to an inadequate volume of blood received in culture bottles Performed at [facility], [address], [place], [place] [address]    Culture   Final    NO GROWTH 3 DAYS Performed at [facility], [address] [place], [place] [address]    Report Status PENDING  Incomplete  Blood Culture (routine x 2)     Status: Abnormal   Collection Time: 01/01/2020  4:00 PM   Specimen: BLOOD LEFT HAND  Result Value Ref Range Status   Specimen Description   Final    BLOOD LEFT HAND Performed at [facility], [address], [place], [place] [address]    Special Requests   Final    BOTTLES DRAWN AEROBIC AND ANAEROBIC Blood Culture adequate volume Performed at [facility], [address], [place], [place] [address]    Culture  Setup Time   Final    GRAM POSITIVE COCCI IN CLUSTERS ANAEROBIC BOTTLE ONLY Organism ID to follow CRITICAL RESULT CALLED TO, READ BACK BY AND VERIFIED WITH: Damaris Hippo [facility] 01/15/20 2154 JDW    Culture (A)  Final    STAPHYLOCOCCUS EPIDERMIDIS THE SIGNIFICANCE OF ISOLATING THIS ORGANISM FROM A SINGLE SET OF BLOOD CULTURES WHEN MULTIPLE SETS ARE DRAWN IS UNCERTAIN. PLEASE NOTIFY THE MICROBIOLOGY DEPARTMENT WITHIN ONE WEEK IF SPECIATION AND SENSITIVITIES ARE REQUIRED. Performed at [facility], [address] [place], [place] [address]    Report Status 01/17/2020 FINAL  Final  Blood Culture ID Panel (Reflexed)     Status: Abnormal   Collection Time: 12/15/2019  4:00 PM  Result Value Ref Range Status   Enterococcus faecalis NOT DETECTED NOT DETECTED Final   Enterococcus Faecium NOT DETECTED NOT DETECTED Final   Listeria monocytogenes NOT DETECTED NOT DETECTED Final   Staphylococcus species DETECTED (A) NOT DETECTED Final    Comment: CRITICAL RESULT CALLED TO, READ BACK BY AND VERIFIED WITH: Damaris Hippo [facility] 01/15/20 2154 JDW    Staphylococcus aureus (BCID) NOT DETECTED NOT DETECTED Final   Staphylococcus epidermidis DETECTED (A) NOT DETECTED Final    Comment: CRITICAL RESULT CALLED TO, READ BACK BY AND VERIFIED  WITH: Damaris Hippo [facility] 01/15/20 2154 JDW  Staphylococcus lugdunensis NOT DETECTED NOT DETECTED Final   Streptococcus species NOT DETECTED NOT DETECTED Final   Streptococcus agalactiae NOT DETECTED NOT DETECTED Final   Streptococcus pneumoniae NOT DETECTED NOT DETECTED Final   Streptococcus pyogenes NOT DETECTED NOT DETECTED Final   A.calcoaceticus-baumannii NOT DETECTED NOT DETECTED Final   Bacteroides fragilis NOT DETECTED NOT DETECTED Final   Enterobacterales NOT DETECTED NOT DETECTED Final   Enterobacter cloacae complex NOT DETECTED NOT DETECTED Final   Escherichia coli NOT DETECTED NOT DETECTED Final   Klebsiella aerogenes NOT DETECTED NOT DETECTED Final   Klebsiella oxytoca NOT DETECTED NOT DETECTED Final   Klebsiella pneumoniae NOT DETECTED NOT DETECTED Final   Proteus species NOT DETECTED NOT DETECTED Final   Salmonella species NOT DETECTED NOT DETECTED Final   Serratia marcescens NOT DETECTED NOT DETECTED Final   Haemophilus influenzae NOT DETECTED NOT DETECTED Final   Neisseria meningitidis NOT DETECTED NOT DETECTED Final   Pseudomonas aeruginosa NOT DETECTED NOT DETECTED Final   Stenotrophomonas maltophilia NOT DETECTED NOT DETECTED Final   Candida albicans NOT DETECTED NOT DETECTED Final   Candida auris NOT DETECTED NOT DETECTED Final   Candida glabrata NOT DETECTED NOT DETECTED Final   Candida krusei NOT DETECTED NOT DETECTED Final   Candida parapsilosis NOT DETECTED NOT DETECTED Final   Candida tropicalis NOT DETECTED NOT DETECTED Final   Cryptococcus neoformans/gattii NOT DETECTED NOT DETECTED Final   Methicillin resistance mecA/C NOT DETECTED NOT DETECTED Final    Comment: Performed at [facility], [address] [place], [place] [address]         Radiology Studies: CT ANGIO CHEST PE W OR WO CONTRAST  Result Date: 01/16/2020 CLINICAL DATA:  Pulmonary embolus suspected, positive D-dimer inpatient with recent COVID pneumonia, hypertension, moderate  obesity presenting to the ER with worsening shortness of breath. EXAM: CT ANGIOGRAPHY CHEST WITH CONTRAST TECHNIQUE: Multidetector CT imaging of the chest was performed using the standard protocol during bolus administration of intravenous contrast. Multiplanar CT image reconstructions and MIPs were obtained to evaluate the vascular anatomy. CONTRAST:  OMNIPAQUE IOHEXOL 350 MG/ML SOLN COMPARISON:  CT angio chest 05/01/2004. FINDINGS: Cardiovascular: Satisfactory opacification of the pulmonary arteries to the segmental level. No evidence of pulmonary embolism. Normal heart size. No significant pericardial effusion. The thoracic aorta is normal in caliber. Mild atherosclerotic plaque. Mediastinum/Nodes: Moderate volume hiatal hernia. There is a right 1.4 cm precarinal lymph node is likely reactive in etiology open (4:39). No enlarged hilar, or axillary lymph nodes. Thyroid gland, trachea, and esophagus demonstrate no significant findings. Lungs/Pleura: Extensive diffuse peripheral and peribronchovascular ground-glass and consolidative opacities that are more prominent in bilateral upper lobes. No pulmonary nodule or mass in the expanded lungs. No pleural effusion. No pneumothorax. Upper Abdomen: No acute abnormality.  Status post cholecystectomy. Musculoskeletal: At least moderate multilevel degenerative changes of the spine. Review of the MIP images confirms the above findings. IMPRESSION: No pulmonary embolus to the segmental level. Findings consistent with known COVID 19 infection with superimposed pulmonary edema. Associated, likely reactive, 1.4 cm right precarinal lymph node. Aortic Atherosclerosis (ICD10-I70.0). Electronically Signed   By: Tish Frederickson M.D.   On: 01/16/2020 14:13   VAS [place] LOWER EXTREMITY VENOUS (DVT)  Result Date: 01/16/2020  Lower Venous DVTStudy Indications: Elevated ddimer, covid +.  Performing Technologist: Blanch Media RVS  Examination Guidelines: A complete evaluation includes  B-mode imaging, spectral Doppler, color Doppler, and power Doppler as needed of all accessible portions of each vessel. Bilateral testing is considered  an integral part of a complete examination. Limited examinations for reoccurring indications may be performed as noted. The reflux portion of the exam is performed with the patient in reverse Trendelenburg.  +---------+---------------+---------+-----------+----------+--------------+ RIGHT    CompressibilityPhasicitySpontaneityPropertiesThrombus Aging +---------+---------------+---------+-----------+----------+--------------+ CFV      Full           Yes      Yes                                 +---------+---------------+---------+-----------+----------+--------------+ SFJ      Full                                                        +---------+---------------+---------+-----------+----------+--------------+ FV Prox  Full                                                        +---------+---------------+---------+-----------+----------+--------------+ FV Mid   Full                                                        +---------+---------------+---------+-----------+----------+--------------+ FV DistalFull                                                        +---------+---------------+---------+-----------+----------+--------------+ PFV      Full                                                        +---------+---------------+---------+-----------+----------+--------------+ POP      Full           Yes      Yes                                 +---------+---------------+---------+-----------+----------+--------------+ PTV      Full                                                        +---------+---------------+---------+-----------+----------+--------------+ PERO     Full                                                         +---------+---------------+---------+-----------+----------+--------------+   +---------+---------------+---------+-----------+----------+--------------+ LEFT     CompressibilityPhasicitySpontaneityPropertiesThrombus Aging +---------+---------------+---------+-----------+----------+--------------+ CFV  Full           Yes      Yes                                 +---------+---------------+---------+-----------+----------+--------------+ SFJ      Full                                                        +---------+---------------+---------+-----------+----------+--------------+ FV Prox  Full                                                        +---------+---------------+---------+-----------+----------+--------------+ FV Mid                  Yes      Yes                                 +---------+---------------+---------+-----------+----------+--------------+ FV Distal               Yes      Yes                                 +---------+---------------+---------+-----------+----------+--------------+ PFV      Full                                                        +---------+---------------+---------+-----------+----------+--------------+ POP      Full           Yes      Yes                                 +---------+---------------+---------+-----------+----------+--------------+ PTV      Full                                                        +---------+---------------+---------+-----------+----------+--------------+ PERO     Full                                                        +---------+---------------+---------+-----------+----------+--------------+     Summary: BILATERAL: - No evidence of deep vein thrombosis seen in the lower extremities, bilaterally. - No evidence of superficial venous thrombosis in the lower extremities, bilaterally. -   *See table(s) above for measurements and observations. Electronically signed by  Lemar Livings MD on 01/16/2020 at 4:49:58 PM.    Final         Scheduled  Meds: . albuterol  2 puff Inhalation Q6H  . amLODipine  10 mg Oral Daily  . vitamin C  500 mg Oral Daily  . baricitinib  4 mg Oral Daily  . enoxaparin (LOVENOX) injection  60 mg Subcutaneous Q24H  . furosemide  40 mg Intravenous BID  . insulin aspart  0-9 Units Subcutaneous TID WC  . methylPREDNISolone (SOLU-MEDROL) injection  60 mg Intravenous Q6H  . metoprolol succinate  100 mg Oral Daily  . oxybutynin  5 mg Oral BID  . zinc sulfate  220 mg Oral Daily   Continuous Infusions: . remdesivir 100 mg in NS 100 mL 100 mg (01/17/20 1029)     LOS: 3 days    Time spent: 42 minutes spent on chart review, discussion with nursing staff, consultants, updating family and interview/physical exam; more than 50% of that time was spent in counseling and/or coordination of care.    Alvira Philips [place], DO [facility] Available via Epic secure chat 7am-7pm After these hours, please refer to coverage provider listed on [email or web address] 01/17/2020, 11:22 AM

## 2020-01-17 NOTE — Plan of Care (Signed)
  Problem: Education: Goal: Knowledge of General Education information will improve Description: Including pain rating scale, medication(s)/side effects and non-pharmacologic comfort measures Outcome: Progressing   Problem: Health Behavior/Discharge Planning: Goal: Ability to manage health-related needs will improve Outcome: Progressing   Problem: Clinical Measurements: Goal: Diagnostic test results will improve Outcome: Progressing Goal: Respiratory complications will improve Outcome: Progressing   Problem: Activity: Goal: Risk for activity intolerance will decrease Outcome: Progressing   Problem: Nutrition: Goal: Adequate nutrition will be maintained Outcome: Progressing   Problem: Coping: Goal: Level of anxiety will decrease Outcome: Progressing   Problem: Safety: Goal: Ability to remain free from injury will improve Outcome: Progressing   Problem: Skin Integrity: Goal: Risk for impaired skin integrity will decrease Outcome: Progressing

## 2020-01-17 NOTE — Progress Notes (Signed)
Changed water bottle on heated high flow nasal cannula system- uneventful. °

## 2020-01-18 LAB — CBC WITH DIFFERENTIAL/PLATELET
Abs Immature Granulocytes: 0.26 10*3/uL — ABNORMAL HIGH (ref 0.00–0.07)
Basophils Absolute: 0 10*3/uL (ref 0.0–0.1)
Basophils Relative: 0 %
Eosinophils Absolute: 0 10*3/uL (ref 0.0–0.5)
Eosinophils Relative: 0 %
HCT: 37.4 % (ref 36.0–46.0)
Hemoglobin: 12.2 g/dL (ref 12.0–15.0)
Immature Granulocytes: 3 %
Lymphocytes Relative: 5 %
Lymphs Abs: 0.5 10*3/uL — ABNORMAL LOW (ref 0.7–4.0)
MCH: 30 pg (ref 26.0–34.0)
MCHC: 32.6 g/dL (ref 30.0–36.0)
MCV: 91.9 fL (ref 80.0–100.0)
Monocytes Absolute: 0.3 10*3/uL (ref 0.1–1.0)
Monocytes Relative: 4 %
Neutro Abs: 8.1 10*3/uL — ABNORMAL HIGH (ref 1.7–7.7)
Neutrophils Relative %: 88 %
Platelets: 243 10*3/uL (ref 150–400)
RBC: 4.07 MIL/uL (ref 3.87–5.11)
RDW: 13.9 % (ref 11.5–15.5)
WBC: 9.2 10*3/uL (ref 4.0–10.5)
nRBC: 0 % (ref 0.0–0.2)

## 2020-01-18 LAB — GLUCOSE, CAPILLARY
Glucose-Capillary: 137 mg/dL — ABNORMAL HIGH (ref 70–99)
Glucose-Capillary: 270 mg/dL — ABNORMAL HIGH (ref 70–99)
Glucose-Capillary: 198 mg/dL — ABNORMAL HIGH (ref 70–99)

## 2020-01-18 LAB — COMPREHENSIVE METABOLIC PANEL
ALT: 28 U/L (ref 0–44)
AST: 16 U/L (ref 15–41)
Albumin: 2.7 g/dL — ABNORMAL LOW (ref 3.5–5.0)
Alkaline Phosphatase: 51 U/L (ref 38–126)
Anion gap: 10 (ref 5–15)
BUN: 64 mg/dL — ABNORMAL HIGH (ref 8–23)
CO2: 34 mmol/L — ABNORMAL HIGH (ref 22–32)
Calcium: 8.6 mg/dL — ABNORMAL LOW (ref 8.9–10.3)
Chloride: 100 mmol/L (ref 98–111)
Creatinine, Ser: 1.4 mg/dL — ABNORMAL HIGH (ref 0.44–1.00)
GFR calc Af Amer: 47 mL/min — ABNORMAL LOW (ref >60)
GFR calc non Af Amer: 40 mL/min — ABNORMAL LOW (ref >60)
Glucose, Bld: 158 mg/dL — ABNORMAL HIGH (ref 70–99)
Potassium: 3.7 mmol/L (ref 3.5–5.1)
Sodium: 144 mmol/L (ref 135–145)
Total Bilirubin: 0.5 mg/dL (ref 0.3–1.2)
Total Protein: 5.6 g/dL — ABNORMAL LOW (ref 6.5–8.1)

## 2020-01-18 LAB — C-REACTIVE PROTEIN: CRP: 1.1 mg/dL — ABNORMAL HIGH (ref <1.0)

## 2020-01-18 LAB — PHOSPHORUS: Phosphorus: 5 mg/dL — ABNORMAL HIGH (ref 2.5–4.6)

## 2020-01-18 LAB — MAGNESIUM: Magnesium: 2.3 mg/dL (ref 1.7–2.4)

## 2020-01-18 LAB — D-DIMER, QUANTITATIVE: D-Dimer, Quant: 3.3 ug/mL-FEU — ABNORMAL HIGH (ref 0.00–0.50)

## 2020-01-18 MED ORDER — METOPROLOL TARTRATE 5 MG/5ML IV SOLN
5.0000 mg | INTRAVENOUS | Status: AC
  Administered 2020-01-18: 5 mg via INTRAVENOUS
  Filled 2020-01-18: qty 5

## 2020-01-18 MED ORDER — BARICITINIB 2 MG PO TABS
2.0000 mg | ORAL_TABLET | Freq: Every day | ORAL | Status: DC
  Administered 2020-01-19 – 2020-01-22 (×4): 2 mg via ORAL
  Filled 2020-01-18 (×4): qty 1

## 2020-01-18 MED ORDER — SODIUM CHLORIDE 0.9 % IV BOLUS
500.0000 mL | Freq: Once | INTRAVENOUS | Status: AC
  Administered 2020-01-18: 500 mL via INTRAVENOUS

## 2020-01-18 NOTE — Progress Notes (Addendum)
PROGRESS NOTE    YASMINA CHICO  XBW:[id number] DOB: 1956/02/16 DOA: 01/30/2020 PCP: Elias Else, MD    Brief Narrative:  JAMONICA SCHOFF is a 64 y.o. female with medical history significant of recent Covid pneumonia, hypertension, moderate obesity who presented to the emergency room with complaints of worsening shortness of breath.   Recently discharged from the hospital on 01/10/2020 following treatment for Covid-19 viral pneumonia with steroids, remdesivir, and baricitinib.  She was discharged on 4 L of oxygen per minute. On EMS arrival, she was hypoxic and was saturating 75% on 6 L of nasal cannula.  Saturation good to 85% with 15 L nonrebreather mask.  Patient was then brought to the emergency department.  As expected, patient has not been vaccinated. She she also mentioned that her husband has Covid at home but not sick as her.  She says she is feeling very weak, has some stomach upset but denies having any nausea, vomiting, diarrhea, headache or dysuria.  In the ED, she was tachypneic, tachycardic, hypertensive on arrival.    Lab work showed elevated inflammatory markers with CRP in the range of 12. Chest x-ray showed moderate to marked severe bilateral patchy infiltrates. Patient admitted for the management of severe refractory Covid pneumonia.   Assessment & Plan:   Principal Problem:   Acute respiratory failure with hypoxia (HCC) Active Problems:   Pneumonia due to COVID-19 virus   Essential hypertension   Obesity, Class III, BMI 40-49.9 (morbid obesity) (HCC)   Acute hypoxic respiratory failure secondary to acute Covid-19 viral pneumonia during the ongoing 2020/2021 Covid 19 Pandemic - POA Patient representing to the ED following recent discharge 4 days prior for progressive shortness of breath despite treatment for Covid-19 with remdesivir, baricitinib and steroids.  Was discharged home on 4 L nasal cannula, notably hypoxic requiring 15 L NRB upon ED presentation.  Chest  x-ray with moderate to marked severity bilateral patchy multifocal infiltrates.  Procalcitonin less than 0.10. --COVID test: PCR + 01/07/2020 --CRP 2.2>12.5>8.5>3.0>1.1 --ddimer 1.06>1.72>3.77>3.27>3.30 --Remdesivir, plan 5-day course (Day #5/5) --Baricitinib 4mg  PO daily (Day #5/14) --Solumedrol 60 mg IV every 6 hours --Discontinue Lasix 40mg  IV BID for increased creatinine 1.06 to 1.40 --prone for 2-3hrs every 12hrs if able --Continue supplemental oxygen, titrate to maintain SPO2 greater than 92%, currently on 45L HFNC/NRB; up from 40L yesterday with SPO2 87% at rest --Continue supportive care with albuterol MDI prn, vitamin C, zinc, Tylenol, antitussives (benzonatate/ Mucinex/Tussionex) --Incentive spirometry, flutter valve --Follow CBC, CMP, D-dimer, ferritin, and CRP daily --Continue airborne/contact isolation precautions for 3 weeks from the day of diagnosis  The treatment plan and use of medications and known side effects were discussed with patient/family. Some of the medications used are based on case reports/anecdotal data.  All other medications being used in the management of COVID-19 based on limited study data.  Complete risks and long-term side effects are unknown, however in the best clinical judgment they seem to be of some benefit.  Patient wanted to proceed with treatment options provided.  Elevated D-dimer --ddimer 1.06>1.72>3.77>3.27>3.30 --Vascular duplex venous ultrasound bilateral lower extremities negative for DVT --CT angiogram chest: Negative for PE  Anxiety: Ativan 0.5 mg every 6 hours as needed  Essential hypertension BP 114/58 this morning, well controlled. --Continue amlodipine 10 mg p.o. daily --Continue metoprolol succinate 100 mg p.o. daily  Morbid obesity BMI 45.03 kg/m.  Discussed with patient need for aggressive lifestyle changes, weight loss as this complicates all facets of care.   DVT prophylaxis: Lovenox  Code Status: Full code Family  Communication: Updated patient extensively at bedside, updated patient spouse, Jillyn HiddenGary via telephone this morning  Disposition Plan:  Status is: Inpatient  Remains inpatient appropriate because:Ongoing diagnostic testing needed not appropriate for outpatient work up, Unsafe d/c plan, IV treatments appropriate due to intensity of illness or inability to take PO and Inpatient level of care appropriate due to severity of illness continues on higher FiO2 requirements, now up to 40 L HFNC/NRB, prognosis guarded   Dispo: The patient is from: Home              Anticipated d/c is to: To be determined              Anticipated d/c date is: > 3 days              Patient currently is not medically stable to d/c.   Consultants:   None  Procedures:   None  Antimicrobials:   Remdesivir 8/31>>  Baricitinib 8/31>>   Subjective: Patient seen and examined bedside, sleeping but easily arousable.   Patient's O2 requirement has increased from 40L to 45L high flow nasal cannula/NRB today.  No other complaints or concerns at this time. Denies headache, no dizziness, no fever/chills/night sweats, no nausea/vomiting/diarrhea, no chest pain, no palpitations, no abdominal pain. No other acute events overnight per nursing staff.  Objective: Vitals:   01/18/20 0319 01/18/20 0354 01/18/20 0757 01/18/20 0844  BP:  (!) 118/59    Pulse:  76    Resp:  18    Temp:  97.6 F (36.4 C)    TempSrc:  Oral    SpO2: (!) 88% (!) 87% 92% (!) 86%  Weight:      Height:        Intake/Output Summary (Last 24 hours) at 01/18/2020 1030 Last data filed at 01/18/2020 0100 Gross per 24 hour  Intake --  Output 1200 ml  Net -1200 ml   Filed Weights   01/15/20 1916  Weight: 119 kg    Examination:  General exam: Appears calm and comfortable Respiratory system: Decreased breath sounds bilaterally, normal respiratory effort, no accessory muscle use, on 45L HFNC/NRB Cardiovascular system: S1 & S2 heard, RRR. No JVD,  murmurs, rubs, gallops or clicks. No pedal edema. Gastrointestinal system: Abdomen is nondistended, soft and nontender. No organomegaly or masses felt. Normal bowel sounds heard. Central nervous system: Alert and oriented. No focal neurological deficits. Extremities: Symmetric 5 x 5 power. Skin: No rashes, lesions or ulcers Psychiatry: Judgement and insight appear normal.  Depressed mood, flat affect    Data Reviewed: I have personally reviewed following labs and imaging studies  CBC: Recent Labs  Lab 12/25/2019 1554 01/15/20 0536 01/16/20 0234 01/17/20 0431 01/18/20 0448  WBC 20.1* 10.7* 13.8* 15.0* 9.2  NEUTROABS 16.4* 9.2* 11.4* 12.7* 8.1*  HGB 13.0 12.6 12.9 12.9 12.2  HCT 39.9 39.7 39.9 41.0 37.4  MCV 93.2 93.9 92.8 95.6 91.9  PLT 467* 447* 392 321 243   Basic Metabolic Panel: Recent Labs  Lab 12/21/2019 1554 01/15/20 0536 01/16/20 0234 01/17/20 0431 01/18/20 0448  NA 138 138 141 144 144  K 3.6 4.3 3.8 3.7 3.7  CL 98 101 104 103 100  CO2 27 25 27 29  34*  GLUCOSE 134* 158* 173* 163* 158*  BUN 12 18 25* 41* 64*  CREATININE 0.84 0.73 0.83 1.06* 1.40*  CALCIUM 8.8* 8.6* 8.7* 8.6* 8.6*  MG  --  2.2 2.3 2.4 2.3  PHOS  --  3.9 3.6 4.4 5.0*   GFR: Estimated Creatinine Clearance: 53.6 mL/min (A) (by C-G formula based on SCr of 1.4 mg/dL (H)). Liver Function Tests: Recent Labs  Lab 01/11/2020 1554 01/15/20 0536 01/16/20 0234 01/17/20 0431 01/18/20 0448  AST 29 34 ALT 41 42 44 34 28  ALKPHOS 59 65 65 57 51  BILITOT 0.9 0.7 0.7 0.5 0.5  PROT 7.0 6.9 6.6 6.1* 5.6*  ALBUMIN 3.1* 2.9* 2.8* 2.8* 2.7*   No results for input(s): LIPASE, AMYLASE in the last 168 hours. No results for input(s): AMMONIA in the last 168 hours. Coagulation Profile: No results for input(s): INR, PROTIME in the last 168 hours. Cardiac Enzymes: No results for input(s): CKTOTAL, CKMB, CKMBINDEX, TROPONINI in the last 168 hours. BNP (last 3 results) No results for input(s): PROBNP in  the last 8760 hours. HbA1C: No results for input(s): HGBA1C in the last 72 hours. CBG: Recent Labs  Lab 01/17/20 0754 01/17/20 1124 01/17/20 1708 01/17/20 2111 01/18/20 0744  GLUCAP 144* 285* 168* 132* 137*   Lipid Profile: No results for input(s): CHOL, HDL, LDLCALC, TRIG, CHOLHDL, LDLDIRECT in the last 72 hours. Thyroid Function Tests: No results for input(s): TSH, T4TOTAL, FREET4, T3FREE, THYROIDAB in the last 72 hours. Anemia Panel: No results for input(s): VITAMINB12, FOLATE, FERRITIN, TIBC, IRON, RETICCTPCT in the last 72 hours. Sepsis Labs: Recent Labs  Lab 12/18/2019 1554  PROCALCITON <0.10  LATICACIDVEN 1.8    Recent Results (from the past 240 hour(s))  Blood Culture (routine x 2)     Status: None (Preliminary result)   Collection Time: 12/21/2019  3:54 PM   Specimen: BLOOD  Result Value Ref Range Status   Specimen Description   Final    BLOOD RIGHT ANTECUBITAL Performed at [facility], [address] [place], [place] [address]    Special Requests   Final    BOTTLES DRAWN AEROBIC AND ANAEROBIC Blood Culture results may not be optimal due to an inadequate volume of blood received in culture bottles Performed at [facility], [address] [place], [place] [address]    Culture   Final    NO GROWTH 3 DAYS Performed at [facility], [address] [place], [place] [address]    Report Status PENDING  Incomplete  Blood Culture (routine x 2)     Status: Abnormal   Collection Time: 12/15/2019  4:00 PM   Specimen: BLOOD LEFT HAND  Result Value Ref Range Status   Specimen Description   Final    BLOOD LEFT HAND Performed at [facility], [address], [place], [place] [address]    Special Requests   Final    BOTTLES DRAWN AEROBIC AND ANAEROBIC Blood Culture adequate volume Performed at [facility], [address] [place], [place] [address]    Culture  Setup Time   Final    GRAM  POSITIVE COCCI IN CLUSTERS ANAEROBIC BOTTLE ONLY Organism ID to follow CRITICAL RESULT CALLED TO, READ BACK BY AND VERIFIED WITH: Damaris Hippo [facility] 01/15/20 2154 JDW    Culture (A)  Final    STAPHYLOCOCCUS EPIDERMIDIS THE SIGNIFICANCE OF ISOLATING THIS ORGANISM FROM A SINGLE SET OF BLOOD CULTURES WHEN MULTIPLE SETS ARE DRAWN IS UNCERTAIN. PLEASE NOTIFY THE MICROBIOLOGY DEPARTMENT WITHIN ONE WEEK IF SPECIATION AND SENSITIVITIES ARE REQUIRED. Performed at [facility], [address], [place], [place] [address]    Report Status 01/17/2020 FINAL  Final  Blood Culture ID Panel (Reflexed)     Status: Abnormal   Collection Time: 12/29/2019  4:00 PM  Result Value Ref Range Status   Enterococcus faecalis NOT DETECTED NOT DETECTED Final   Enterococcus Faecium NOT DETECTED NOT DETECTED Final   Listeria monocytogenes NOT DETECTED NOT DETECTED Final   Staphylococcus species DETECTED (A) NOT DETECTED Final    Comment: CRITICAL RESULT CALLED TO, READ BACK BY AND VERIFIED WITH: Damaris Hippo [facility] 01/15/20 2154 JDW    Staphylococcus aureus (BCID) NOT DETECTED NOT DETECTED Final   Staphylococcus epidermidis DETECTED (A) NOT DETECTED Final    Comment: CRITICAL RESULT CALLED TO, READ BACK BY AND VERIFIED WITH: Damaris Hippo [facility] 01/15/20 2154 JDW    Staphylococcus lugdunensis NOT DETECTED NOT DETECTED Final   Streptococcus species NOT DETECTED NOT DETECTED Final   Streptococcus agalactiae NOT DETECTED NOT DETECTED Final   Streptococcus pneumoniae NOT DETECTED NOT DETECTED Final   Streptococcus pyogenes NOT DETECTED NOT DETECTED Final   A.calcoaceticus-baumannii NOT DETECTED NOT DETECTED Final   Bacteroides fragilis NOT DETECTED NOT DETECTED Final   Enterobacterales NOT DETECTED NOT DETECTED Final   Enterobacter cloacae complex NOT DETECTED NOT DETECTED Final   Escherichia coli NOT DETECTED NOT DETECTED Final   Klebsiella aerogenes NOT DETECTED NOT DETECTED Final   Klebsiella oxytoca NOT DETECTED NOT  DETECTED Final   Klebsiella pneumoniae NOT DETECTED NOT DETECTED Final   Proteus species NOT DETECTED NOT DETECTED Final   Salmonella species NOT DETECTED NOT DETECTED Final   Serratia marcescens NOT DETECTED NOT DETECTED Final   Haemophilus influenzae NOT DETECTED NOT DETECTED Final   Neisseria meningitidis NOT DETECTED NOT DETECTED Final   Pseudomonas aeruginosa NOT DETECTED NOT DETECTED Final   Stenotrophomonas maltophilia NOT DETECTED NOT DETECTED Final   Candida albicans NOT DETECTED NOT DETECTED Final   Candida auris NOT DETECTED NOT DETECTED Final   Candida glabrata NOT DETECTED NOT DETECTED Final   Candida krusei NOT DETECTED NOT DETECTED Final   Candida parapsilosis NOT DETECTED NOT DETECTED Final   Candida tropicalis NOT DETECTED NOT DETECTED Final   Cryptococcus neoformans/gattii NOT DETECTED NOT DETECTED Final   Methicillin resistance mecA/C NOT DETECTED NOT DETECTED Final    Comment: Performed at [facility], [address] [place], [place] [address]         Radiology Studies: CT ANGIO CHEST PE W OR WO CONTRAST  Result Date: 01/16/2020 CLINICAL DATA:  Pulmonary embolus suspected, positive D-dimer inpatient with recent COVID pneumonia, hypertension, moderate obesity presenting to the ER with worsening shortness of breath. EXAM: CT ANGIOGRAPHY CHEST WITH CONTRAST TECHNIQUE: Multidetector CT imaging of the chest was performed using the standard protocol during bolus administration of intravenous contrast. Multiplanar CT image reconstructions and MIPs were obtained to evaluate the vascular anatomy. CONTRAST:  OMNIPAQUE IOHEXOL 350 MG/ML SOLN COMPARISON:  CT angio chest 05/01/2004. FINDINGS: Cardiovascular: Satisfactory opacification of the pulmonary arteries to the segmental level. No evidence of pulmonary embolism. Normal heart size. No significant pericardial effusion. The thoracic aorta is normal in caliber. Mild atherosclerotic plaque. Mediastinum/Nodes: Moderate  volume hiatal hernia. There is a right 1.4 cm precarinal lymph node is likely reactive in etiology open (4:39). No enlarged hilar, or axillary lymph nodes. Thyroid gland, trachea, and esophagus demonstrate no significant findings. Lungs/Pleura: Extensive diffuse peripheral and peribronchovascular ground-glass and consolidative opacities that are more prominent in bilateral upper lobes. No pulmonary nodule or mass in the expanded lungs. No pleural effusion. No pneumothorax. Upper Abdomen: No acute abnormality.  Status post cholecystectomy. Musculoskeletal: At least moderate multilevel degenerative changes of the spine. Review of the MIP images confirms the above findings. IMPRESSION: No pulmonary embolus to the segmental level. Findings consistent with known COVID 19 infection with superimposed pulmonary edema. Associated, likely reactive, 1.4 cm right precarinal lymph node. Aortic Atherosclerosis (ICD10-I70.0). Electronically Signed   By: Tish Frederickson M.D.   On: 01/16/2020 14:13   VAS [place] LOWER EXTREMITY VENOUS (DVT)  Result Date: 01/16/2020  Lower Venous DVTStudy Indications: Elevated ddimer, covid +.  Performing Technologist: Blanch Media RVS  Examination Guidelines: A complete evaluation includes B-mode imaging, spectral Doppler, color Doppler, and power Doppler as needed of all accessible portions of each vessel. Bilateral testing is considered an integral part of a complete examination. Limited examinations for reoccurring indications may be performed as noted. The reflux portion of the exam is performed with the patient in reverse Trendelenburg.  +---------+---------------+---------+-----------+----------+--------------+ RIGHT    CompressibilityPhasicitySpontaneityPropertiesThrombus Aging +---------+---------------+---------+-----------+----------+--------------+ CFV      Full           Yes      Yes                                  +---------+---------------+---------+-----------+----------+--------------+ SFJ      Full                                                        +---------+---------------+---------+-----------+----------+--------------+ FV Prox  Full                                                        +---------+---------------+---------+-----------+----------+--------------+ FV Mid   Full                                                        +---------+---------------+---------+-----------+----------+--------------+ FV DistalFull                                                        +---------+---------------+---------+-----------+----------+--------------+ PFV      Full                                                        +---------+---------------+---------+-----------+----------+--------------+ POP      Full           Yes      Yes                                 +---------+---------------+---------+-----------+----------+--------------+ PTV      Full                                                        +---------+---------------+---------+-----------+----------+--------------+  PERO     Full                                                        +---------+---------------+---------+-----------+----------+--------------+   +---------+---------------+---------+-----------+----------+--------------+ LEFT     CompressibilityPhasicitySpontaneityPropertiesThrombus Aging +---------+---------------+---------+-----------+----------+--------------+ CFV      Full           Yes      Yes                                 +---------+---------------+---------+-----------+----------+--------------+ SFJ      Full                                                        +---------+---------------+---------+-----------+----------+--------------+ FV Prox  Full                                                         +---------+---------------+---------+-----------+----------+--------------+ FV Mid                  Yes      Yes                                 +---------+---------------+---------+-----------+----------+--------------+ FV Distal               Yes      Yes                                 +---------+---------------+---------+-----------+----------+--------------+ PFV      Full                                                        +---------+---------------+---------+-----------+----------+--------------+ POP      Full           Yes      Yes                                 +---------+---------------+---------+-----------+----------+--------------+ PTV      Full                                                        +---------+---------------+---------+-----------+----------+--------------+ PERO     Full                                                        +---------+---------------+---------+-----------+----------+--------------+  Summary: BILATERAL: - No evidence of deep vein thrombosis seen in the lower extremities, bilaterally. - No evidence of superficial venous thrombosis in the lower extremities, bilaterally. -   *See table(s) above for measurements and observations. Electronically signed by Lemar Livings MD on 01/16/2020 at 4:49:58 PM.    Final         Scheduled Meds: . albuterol  2 puff Inhalation Q6H  . amLODipine  10 mg Oral Daily  . vitamin C  500 mg Oral Daily  . baricitinib  4 mg Oral Daily  . enoxaparin (LOVENOX) injection  60 mg Subcutaneous Q24H  . furosemide  40 mg Intravenous BID  . insulin aspart  0-9 Units Subcutaneous TID WC  . methylPREDNISolone (SOLU-MEDROL) injection  60 mg Intravenous Q6H  . metoprolol succinate  100 mg Oral Daily  . oxybutynin  5 mg Oral BID  . zinc sulfate  220 mg Oral Daily   Continuous Infusions: . remdesivir 100 mg in NS 100 mL 100 mg (01/18/20 1011)     LOS: 4 days    Time spent: 42 minutes spent on  chart review, discussion with nursing staff, consultants, updating family and interview/physical exam; more than 50% of that time was spent in counseling and/or coordination of care.    Alvira Philips [place], DO [facility] Available via Epic secure chat 7am-7pm After these hours, please refer to coverage provider listed on [email or web address] 01/18/2020, 10:30 AM

## 2020-01-18 NOTE — Progress Notes (Signed)
HR noted to be sustaining in the 120's to 140's.  Patient asleep in the chair, denies any chest pain.  Other vital signs stable.  MD notified.  EKG performed.

## 2020-01-18 NOTE — Progress Notes (Signed)
   01/18/20 1346  Assess: MEWS Score  Temp 97.8 F (36.6 C)  BP 117/62  Pulse Rate (!) 121  Resp (!) 28  Level of Consciousness Alert  SpO2 (!) 88 %  O2 Device HFNC  Heater temperature 93.4 F (34.1 C)  O2 Flow Rate (L/min) 45 L/min  FiO2 (%) 100 %  Assess: MEWS Score  MEWS Temp 0  MEWS Systolic 0  MEWS Pulse 2  MEWS RR 2  MEWS LOC 0  MEWS Score 4  MEWS Score Color Red  Assess: if the MEWS score is Yellow or Red  Were vital signs taken at a resting state? Yes  Focused Assessment Change from prior assessment (see assessment flowsheet)  Early Detection of Sepsis Score *See Row Information* Low  MEWS guidelines implemented *See Row Information* Yes  Treat  Pain Scale 0-10  Pain Score 0  Take Vital Signs  Increase Vital Sign Frequency  Red: Q 1hr X 4 then Q 4hr X 4, if remains red, continue Q 4hrs  Escalate  MEWS: Escalate Red: discuss with charge nurse/RN and provider, consider discussing with RRT  Notify: Charge Nurse/RN  Name of Charge Nurse/RN Notified dana dark  Date Charge Nurse/RN Notified 01/18/20  Time Charge Nurse/RN Notified 1349  Notify: Provider  Provider Name/Title [place]  Date Provider Notified 01/18/20  Time Provider Notified 1345  Notification Type Page  Notification Reason Change in status  Response See new orders  Date of Provider Response 01/18/20  Time of Provider Response 1345

## 2020-01-19 LAB — CBC WITH DIFFERENTIAL/PLATELET
Abs Immature Granulocytes: 0.38 10*3/uL — ABNORMAL HIGH (ref 0.00–0.07)
Basophils Absolute: 0 10*3/uL (ref 0.0–0.1)
Basophils Relative: 0 %
Eosinophils Absolute: 0 10*3/uL (ref 0.0–0.5)
Eosinophils Relative: 0 %
HCT: 40.6 % (ref 36.0–46.0)
Hemoglobin: 13.1 g/dL (ref 12.0–15.0)
Immature Granulocytes: 2 %
Lymphocytes Relative: 3 %
Lymphs Abs: 0.6 10*3/uL — ABNORMAL LOW (ref 0.7–4.0)
MCH: 30.1 pg (ref 26.0–34.0)
MCHC: 32.3 g/dL (ref 30.0–36.0)
MCV: 93.3 fL (ref 80.0–100.0)
Monocytes Absolute: 1 10*3/uL (ref 0.1–1.0)
Monocytes Relative: 5 %
Neutro Abs: 17.2 10*3/uL — ABNORMAL HIGH (ref 1.7–7.7)
Neutrophils Relative %: 90 %
Platelets: 278 10*3/uL (ref 150–400)
RBC: 4.35 MIL/uL (ref 3.87–5.11)
RDW: 13.8 % (ref 11.5–15.5)
WBC: 19.3 10*3/uL — ABNORMAL HIGH (ref 4.0–10.5)
nRBC: 0 % (ref 0.0–0.2)

## 2020-01-19 LAB — MAGNESIUM: Magnesium: 2.5 mg/dL — ABNORMAL HIGH (ref 1.7–2.4)

## 2020-01-19 LAB — COMPREHENSIVE METABOLIC PANEL
ALT: 27 U/L (ref 0–44)
AST: 16 U/L (ref 15–41)
Albumin: 3 g/dL — ABNORMAL LOW (ref 3.5–5.0)
Alkaline Phosphatase: 54 U/L (ref 38–126)
Anion gap: 13 (ref 5–15)
BUN: 75 mg/dL — ABNORMAL HIGH (ref 8–23)
CO2: 34 mmol/L — ABNORMAL HIGH (ref 22–32)
Calcium: 8.9 mg/dL (ref 8.9–10.3)
Chloride: 98 mmol/L (ref 98–111)
Creatinine, Ser: 1.33 mg/dL — ABNORMAL HIGH (ref 0.44–1.00)
GFR calc Af Amer: 50 mL/min — ABNORMAL LOW (ref >60)
GFR calc non Af Amer: 43 mL/min — ABNORMAL LOW (ref >60)
Glucose, Bld: 155 mg/dL — ABNORMAL HIGH (ref 70–99)
Potassium: 3.5 mmol/L (ref 3.5–5.1)
Sodium: 145 mmol/L (ref 135–145)
Total Bilirubin: 0.6 mg/dL (ref 0.3–1.2)
Total Protein: 6.1 g/dL — ABNORMAL LOW (ref 6.5–8.1)

## 2020-01-19 LAB — CULTURE, BLOOD (ROUTINE X 2): Culture: NO GROWTH

## 2020-01-19 LAB — D-DIMER, QUANTITATIVE: D-Dimer, Quant: 5.76 ug/mL-FEU — ABNORMAL HIGH (ref 0.00–0.50)

## 2020-01-19 LAB — PHOSPHORUS: Phosphorus: 4.6 mg/dL (ref 2.5–4.6)

## 2020-01-19 LAB — GLUCOSE, CAPILLARY
Glucose-Capillary: 159 mg/dL — ABNORMAL HIGH (ref 70–99)
Glucose-Capillary: 253 mg/dL — ABNORMAL HIGH (ref 70–99)
Glucose-Capillary: 247 mg/dL — ABNORMAL HIGH (ref 70–99)
Glucose-Capillary: 148 mg/dL — ABNORMAL HIGH (ref 70–99)

## 2020-01-19 LAB — C-REACTIVE PROTEIN: CRP: 0.9 mg/dL (ref <1.0)

## 2020-01-19 MED ORDER — ACETAMINOPHEN 325 MG PO TABS
650.0000 mg | ORAL_TABLET | Freq: Four times a day (QID) | ORAL | Status: DC | PRN
  Administered 2020-01-19 – 2020-02-03 (×6): 650 mg via ORAL
  Filled 2020-01-19 (×6): qty 2

## 2020-01-19 MED ORDER — PROCHLORPERAZINE EDISYLATE 10 MG/2ML IJ SOLN
10.0000 mg | Freq: Four times a day (QID) | INTRAMUSCULAR | Status: DC | PRN
  Administered 2020-01-19: 10 mg via INTRAVENOUS
  Filled 2020-01-19: qty 2

## 2020-01-19 NOTE — Progress Notes (Signed)
PROGRESS NOTE    Julie MarionDeborah M Walth  EAV:[id number] DOB: 1956/02/03 DOA: 12/20/2019 PCP: Elias Elseeade, Robert, MD    Brief Narrative:  Julie Mcguire is a 64 y.o. female with medical history significant of recent Covid pneumonia, hypertension, moderate obesity who presented to the emergency room with complaints of worsening shortness of breath.   Recently discharged from the hospital on 01/10/2020 following treatment for Covid-19 viral pneumonia with steroids, remdesivir, and baricitinib.  She was discharged on 4 L of oxygen per minute. On EMS arrival, she was hypoxic and was saturating 75% on 6 L of nasal cannula.  Saturation good to 85% with 15 L nonrebreather mask.  Patient was then brought to the emergency department.  As expected, patient has not been vaccinated. She she also mentioned that her husband has Covid at home but not sick as her.  She says she is feeling very weak, has some stomach upset but denies having any nausea, vomiting, diarrhea, headache or dysuria.  In the ED, she was tachypneic, tachycardic, hypertensive on arrival.    Lab work showed elevated inflammatory markers with CRP in the range of 12. Chest x-ray showed moderate to marked severe bilateral patchy infiltrates. Patient admitted for the management of severe refractory Covid pneumonia.   Assessment & Plan:   Principal Problem:   Acute respiratory failure with hypoxia (HCC) Active Problems:   Pneumonia due to COVID-19 virus   Essential hypertension   Obesity, Class III, BMI 40-49.9 (morbid obesity) (HCC)   Acute hypoxic respiratory failure secondary to acute Covid-19 viral pneumonia during the ongoing 2020/2021 Covid 19 Pandemic - POA Patient representing to the ED following recent discharge 4 days prior for progressive shortness of breath despite treatment for Covid-19 with remdesivir, baricitinib and steroids.  Was discharged home on 4 L nasal cannula, notably hypoxic requiring 15 L NRB upon ED presentation.  Chest  x-ray with moderate to marked severity bilateral patchy multifocal infiltrates.  Procalcitonin less than 0.10. --COVID test: PCR + 01/07/2020 --CRP 2.2>12.5>8.5>3.0>1.1>0.9 --ddimer 1.06>1.72>3.77>3.27>3.30>5.76 --Completed 5-day course of remdesivir on 9/4 --Baricitinib 4mg  PO daily (Day #6/14) --Solumedrol 60 mg IV every 6 hours --prone for 2-3hrs every 12hrs if able --Continue supplemental oxygen, titrate to maintain SPO2 greater than 92%, currently on 55L HFNC/NRB; up from 45L yesterday with SPO2 88% at rest --Continue supportive care with albuterol MDI prn, vitamin C, zinc, Tylenol, antitussives (benzonatate/ Mucinex/Tussionex) --Incentive spirometry, flutter valve --Follow CBC, CMP, D-dimer, ferritin, and CRP daily --Continue airborne/contact isolation precautions for 3 weeks from the day of diagnosis --Palliative care consultation for assistance with goals of care and medical decision making  The treatment plan and use of medications and known side effects were discussed with patient/family. Some of the medications used are based on case reports/anecdotal data.  All other medications being used in the management of COVID-19 based on limited study data.  Complete risks and long-term side effects are unknown, however in the best clinical judgment they seem to be of some benefit.  Patient wanted to proceed with treatment options provided.  Elevated D-dimer --ddimer 1.06>1.72>3.77>3.27>3.30>5.76 --Vascular duplex venous ultrasound bilateral lower extremities negative for DVT --CT angiogram chest: Negative for PE  Anxiety: Ativan 0.5 mg every 6 hours as needed  Essential hypertension BP 114/58 this morning, well controlled. --Continue amlodipine 10 mg p.o. daily --Continue metoprolol succinate 100 mg p.o. daily  Morbid obesity BMI 45.03 kg/m.  Discussed with patient need for aggressive lifestyle changes, weight loss as this complicates all facets of care.  DVT prophylaxis:  Lovenox Code Status: Full code Family Communication: Updated patient extensively at bedside, updated patient spouse, [name] via telephone this morning  Disposition Plan:  Status is: Inpatient  Remains inpatient appropriate because:Ongoing diagnostic testing needed not appropriate for outpatient work up, Unsafe d/c plan, IV treatments appropriate due to intensity of illness or inability to take PO and Inpatient level of care appropriate due to severity of illness continues on high FiO2 requirements, now up to 55 L HFNC/NRB, prognosis guarded   Dispo: The patient is from: Home              Anticipated d/c is to: To be determined              Anticipated d/c date is: > 3 days              Patient currently is not medically stable to d/c.   Consultants:   Palliative care: Consult pending  Procedures:   None  Antimicrobials:   Remdesivir 8/31 - 9/4  Baricitinib 8/31>>   Subjective: Patient seen and examined bedside, sleeping but arousable.   Patient's O2 requirement has increased from 45L to 55L high flow nasal cannula/NRB overnight after significant desaturation while trying to use the bedside commode last night.  No other complaints or concerns at this time.  Requesting some ginger ale and Ensure to drink this morning.  Denies headache, no dizziness, no fever/chills/night sweats, no nausea/vomiting/diarrhea, no chest pain, no palpitations, no abdominal pain. No other acute events overnight per nursing staff.  Objective: Vitals:   01/19/20 0500 01/19/20 0605 01/19/20 0607 01/19/20 0826  BP:   (!) 146/74   Pulse:   82   Resp: Temp:   97.7 F (36.5 C)   TempSrc:   Oral   SpO2:   (!) 88% (!) 89%  Weight: 115.8 kg     Height:        Intake/Output Summary (Last 24 hours) at 01/19/2020 1000 Last data filed at 01/18/2020 1810 Gross per 24 hour  Intake --  Output 1100 ml  Net -1100 ml   Filed Weights   01/15/20 1916 01/19/20 0500  Weight: 119 kg 115.8 kg     Examination:  General exam: Appears calm and comfortable Respiratory system: Decreased breath sounds bilaterally, normal respiratory effort, no accessory muscle use, on 55L HFNC/NRB Cardiovascular system: S1 & S2 heard, RRR. No JVD, murmurs, rubs, gallops or clicks. No pedal edema. Gastrointestinal system: Abdomen is nondistended, soft and nontender. No organomegaly or masses felt. Normal bowel sounds heard. Central nervous system: Alert and oriented. No focal neurological deficits. Extremities: Symmetric 5 x 5 power. Skin: No rashes, lesions or ulcers Psychiatry: Judgement and insight appear normal.  Depressed mood, flat affect    Data Reviewed: I have personally reviewed following labs and imaging studies  CBC: Recent Labs  Lab 01/15/20 0536 01/16/20 0234 01/17/20 0431 01/18/20 0448 01/19/20 0554  WBC 10.7* 13.8* 15.0* 9.2 19.3*  NEUTROABS 9.2* 11.4* 12.7* 8.1* 17.2*  HGB 12.6 12.9 12.9 12.2 13.1  HCT 39.7 39.9 41.0 37.4 40.6  MCV 93.9 92.8 95.6 91.9 93.3  PLT 447* 392 321 243 278   Basic Metabolic Panel: Recent Labs  Lab 01/15/20 0536 01/16/20 0234 01/17/20 0431 01/18/20 0448 01/19/20 0554  NA 138 141 144 144 145  K 4.3 3.8 3.7 3.7 3.5  CL 101 104 103 100 98  CO2 34* 34*  GLUCOSE 158* 173* 163* 158* 155*  BUN 18 25* 41* 64* 75*  CREATININE 0.73 0.83 1.06* 1.40* 1.33*  CALCIUM 8.6* 8.7* 8.6* 8.6* 8.9  MG 2.2 2.3 2.4 2.3 2.5*  PHOS 3.9 3.6 4.4 5.0* 4.6   GFR: Estimated Creatinine Clearance: 55.5 mL/min (A) (by C-G formula based on SCr of 1.33 mg/dL (H)). Liver Function Tests: Recent Labs  Lab 01/15/20 0536 01/16/20 0234 01/17/20 0431 01/18/20 0448 01/19/20 0554  AST 34 26 18 16 16   ALT 42 44 34 28 27  ALKPHOS 65 65 57 51 54  BILITOT 0.7 0.7 0.5 0.5 0.6  PROT 6.9 6.6 6.1* 5.6* 6.1*  ALBUMIN 2.9* 2.8* 2.8* 2.7* 3.0*   No results for input(s): LIPASE, AMYLASE in the last 168 hours. No results for input(s): AMMONIA in the last 168  hours. Coagulation Profile: No results for input(s): INR, PROTIME in the last 168 hours. Cardiac Enzymes: No results for input(s): CKTOTAL, CKMB, CKMBINDEX, TROPONINI in the last 168 hours. BNP (last 3 results) No results for input(s): PROBNP in the last 8760 hours. HbA1C: No results for input(s): HGBA1C in the last 72 hours. CBG: Recent Labs  Lab 01/17/20 2111 01/18/20 0744 01/18/20 1227 01/18/20 1653 01/19/20 0807  GLUCAP 132* 137* 270* 198* 159*   Lipid Profile: No results for input(s): CHOL, HDL, LDLCALC, TRIG, CHOLHDL, LDLDIRECT in the last 72 hours. Thyroid Function Tests: No results for input(s): TSH, T4TOTAL, FREET4, T3FREE, THYROIDAB in the last 72 hours. Anemia Panel: No results for input(s): VITAMINB12, FOLATE, FERRITIN, TIBC, IRON, RETICCTPCT in the last 72 hours. Sepsis Labs: Recent Labs  Lab 01/11/2020 1554  PROCALCITON <0.10  LATICACIDVEN 1.8    Recent Results (from the past 240 hour(s))  Blood Culture (routine x 2)     Status: None   Collection Time: 12/24/2019  3:54 PM   Specimen: BLOOD  Result Value Ref Range Status   Specimen Description   Final    BLOOD RIGHT ANTECUBITAL Performed at [facility], [address], [place], [place]    Special Requests   Final    BOTTLES DRAWN AEROBIC AND ANAEROBIC Blood Culture results may not be optimal due to an inadequate volume of blood received in culture bottles Performed at [facility], [address], [place], [place]    Culture   Final    NO GROWTH 5 DAYS Performed at [facility], [address], [place], [place]    Report Status 01/19/2020 FINAL  Final  Blood Culture (routine x 2)     Status: Abnormal   Collection Time: 12/21/2019  4:00 PM   Specimen: BLOOD LEFT HAND  Result Value Ref Range Status   Specimen Description   Final    BLOOD LEFT HAND Performed at [facility], [address], [place], [place]  [place]    Special Requests   Final    BOTTLES DRAWN AEROBIC AND ANAEROBIC Blood Culture adequate volume Performed at [facility], [address] [place], [place]    Culture  Setup Time   Final    GRAM POSITIVE COCCI IN CLUSTERS ANAEROBIC BOTTLE ONLY Organism ID to follow CRITICAL RESULT CALLED TO, READ BACK BY AND VERIFIED WITH: [id number] [facility] 01/15/20 2154 JDW    Culture (A)  Final    STAPHYLOCOCCUS EPIDERMIDIS THE SIGNIFICANCE OF ISOLATING THIS ORGANISM FROM A SINGLE SET OF BLOOD CULTURES WHEN MULTIPLE SETS ARE DRAWN IS UNCERTAIN. PLEASE NOTIFY THE MICROBIOLOGY DEPARTMENT WITHIN ONE WEEK  IF SPECIATION AND SENSITIVITIES ARE REQUIRED. Performed at [facility], [address] [place], [place] [address]    Report Status 01/17/2020 FINAL  Final  Blood Culture ID Panel (Reflexed)     Status: Abnormal   Collection Time: 02/04/2020  4:00 PM  Result Value Ref Range Status   Enterococcus faecalis NOT DETECTED NOT DETECTED Final   Enterococcus Faecium NOT DETECTED NOT DETECTED Final   Listeria monocytogenes NOT DETECTED NOT DETECTED Final   Staphylococcus species DETECTED (A) NOT DETECTED Final    Comment: CRITICAL RESULT CALLED TO, READ BACK BY AND VERIFIED WITH: Damaris Hippo [facility] 01/15/20 2154 JDW    Staphylococcus aureus (BCID) NOT DETECTED NOT DETECTED Final   Staphylococcus epidermidis DETECTED (A) NOT DETECTED Final    Comment: CRITICAL RESULT CALLED TO, READ BACK BY AND VERIFIED WITH: Damaris Hippo [facility] 01/15/20 2154 JDW    Staphylococcus lugdunensis NOT DETECTED NOT DETECTED Final   Streptococcus species NOT DETECTED NOT DETECTED Final   Streptococcus agalactiae NOT DETECTED NOT DETECTED Final   Streptococcus pneumoniae NOT DETECTED NOT DETECTED Final   Streptococcus pyogenes NOT DETECTED NOT DETECTED Final   A.calcoaceticus-baumannii NOT DETECTED NOT DETECTED Final   Bacteroides fragilis NOT DETECTED NOT DETECTED Final   Enterobacterales NOT  DETECTED NOT DETECTED Final   Enterobacter cloacae complex NOT DETECTED NOT DETECTED Final   Escherichia coli NOT DETECTED NOT DETECTED Final   Klebsiella aerogenes NOT DETECTED NOT DETECTED Final   Klebsiella oxytoca NOT DETECTED NOT DETECTED Final   Klebsiella pneumoniae NOT DETECTED NOT DETECTED Final   Proteus species NOT DETECTED NOT DETECTED Final   Salmonella species NOT DETECTED NOT DETECTED Final   Serratia marcescens NOT DETECTED NOT DETECTED Final   Haemophilus influenzae NOT DETECTED NOT DETECTED Final   Neisseria meningitidis NOT DETECTED NOT DETECTED Final   Pseudomonas aeruginosa NOT DETECTED NOT DETECTED Final   Stenotrophomonas maltophilia NOT DETECTED NOT DETECTED Final   Candida albicans NOT DETECTED NOT DETECTED Final   Candida auris NOT DETECTED NOT DETECTED Final   Candida glabrata NOT DETECTED NOT DETECTED Final   Candida krusei NOT DETECTED NOT DETECTED Final   Candida parapsilosis NOT DETECTED NOT DETECTED Final   Candida tropicalis NOT DETECTED NOT DETECTED Final   Cryptococcus neoformans/gattii NOT DETECTED NOT DETECTED Final   Methicillin resistance mecA/C NOT DETECTED NOT DETECTED Final    Comment: Performed at [facility], [address] [place], [place] [address]         Radiology Studies: No results found.      Scheduled Meds: . albuterol  2 puff Inhalation Q6H  . amLODipine  10 mg Oral Daily  . vitamin C  500 mg Oral Daily  . baricitinib  2 mg Oral Daily  . enoxaparin (LOVENOX) injection  60 mg Subcutaneous Q24H  . insulin aspart  0-9 Units Subcutaneous TID WC  . methylPREDNISolone (SOLU-MEDROL) injection  60 mg Intravenous Q6H  . metoprolol succinate  100 mg Oral Daily  . oxybutynin  5 mg Oral BID  . zinc sulfate  220 mg Oral Daily   Continuous Infusions:    LOS: 5 days    Time spent: 41 minutes spent on chart review, discussion with nursing staff, consultants, updating family and interview/physical exam; more than 50% of  that time was spent in counseling and/or coordination of care.    Alvira Philips [place], DO [facility] Available via Epic secure chat 7am-7pm After these hours, please refer to coverage provider listed on  [email or web address] 01/19/2020, 10:00 AM

## 2020-01-20 ENCOUNTER — Inpatient Hospital Stay (HOSPITAL_COMMUNITY): Payer: BC Managed Care – PPO

## 2020-01-20 DIAGNOSIS — Z7189 Other specified counseling: Secondary | ICD-10-CM

## 2020-01-20 DIAGNOSIS — R6510 Systemic inflammatory response syndrome (SIRS) of non-infectious origin without acute organ dysfunction: Secondary | ICD-10-CM

## 2020-01-20 DIAGNOSIS — R0603 Acute respiratory distress: Secondary | ICD-10-CM

## 2020-01-20 DIAGNOSIS — I10 Essential (primary) hypertension: Secondary | ICD-10-CM

## 2020-01-20 DIAGNOSIS — Z515 Encounter for palliative care: Secondary | ICD-10-CM

## 2020-01-20 DIAGNOSIS — J1282 Pneumonia due to coronavirus disease 2019: Secondary | ICD-10-CM

## 2020-01-20 DIAGNOSIS — U071 COVID-19: Secondary | ICD-10-CM

## 2020-01-20 LAB — CBC
HCT: 41.2 % (ref 36.0–46.0)
Hemoglobin: 13.2 g/dL (ref 12.0–15.0)
MCH: 30.3 pg (ref 26.0–34.0)
MCHC: 32 g/dL (ref 30.0–36.0)
MCV: 94.5 fL (ref 80.0–100.0)
Platelets: 202 10*3/uL (ref 150–400)
RBC: 4.36 MIL/uL (ref 3.87–5.11)
RDW: 13.9 % (ref 11.5–15.5)
WBC: 24.4 10*3/uL — ABNORMAL HIGH (ref 4.0–10.5)
nRBC: 0 % (ref 0.0–0.2)

## 2020-01-20 LAB — EXPECTORATED SPUTUM ASSESSMENT W GRAM STAIN, RFLX TO RESP C

## 2020-01-20 LAB — COMPREHENSIVE METABOLIC PANEL
ALT: 25 U/L (ref 0–44)
AST: 16 U/L (ref 15–41)
Albumin: 3.1 g/dL — ABNORMAL LOW (ref 3.5–5.0)
Alkaline Phosphatase: 52 U/L (ref 38–126)
Anion gap: 11 (ref 5–15)
BUN: 62 mg/dL — ABNORMAL HIGH (ref 8–23)
CO2: 38 mmol/L — ABNORMAL HIGH (ref 22–32)
Calcium: 9 mg/dL (ref 8.9–10.3)
Chloride: 99 mmol/L (ref 98–111)
Creatinine, Ser: 1.12 mg/dL — ABNORMAL HIGH (ref 0.44–1.00)
GFR calc Af Amer: >60 mL/min (ref >60)
GFR calc non Af Amer: 53 mL/min — ABNORMAL LOW (ref >60)
Glucose, Bld: 167 mg/dL — ABNORMAL HIGH (ref 70–99)
Potassium: 3.9 mmol/L (ref 3.5–5.1)
Sodium: 148 mmol/L — ABNORMAL HIGH (ref 135–145)
Total Bilirubin: 0.7 mg/dL (ref 0.3–1.2)
Total Protein: 6.5 g/dL (ref 6.5–8.1)

## 2020-01-20 LAB — GLUCOSE, CAPILLARY
Glucose-Capillary: 159 mg/dL — ABNORMAL HIGH (ref 70–99)
Glucose-Capillary: 171 mg/dL — ABNORMAL HIGH (ref 70–99)
Glucose-Capillary: 125 mg/dL — ABNORMAL HIGH (ref 70–99)
Glucose-Capillary: 144 mg/dL — ABNORMAL HIGH (ref 70–99)

## 2020-01-20 LAB — STREP PNEUMONIAE URINARY ANTIGEN: Strep Pneumo Urinary Antigen: NEGATIVE

## 2020-01-20 LAB — C-REACTIVE PROTEIN: CRP: 2 mg/dL — ABNORMAL HIGH (ref <1.0)

## 2020-01-20 LAB — PROCALCITONIN: Procalcitonin: <0.1 ng/mL

## 2020-01-20 LAB — D-DIMER, QUANTITATIVE: D-Dimer, Quant: 2.68 ug/mL-FEU — ABNORMAL HIGH (ref 0.00–0.50)

## 2020-01-20 LAB — MAGNESIUM: Magnesium: 2.9 mg/dL — ABNORMAL HIGH (ref 1.7–2.4)

## 2020-01-20 MED ORDER — LORAZEPAM 2 MG/ML IJ SOLN
0.5000 mg | Freq: Once | INTRAMUSCULAR | Status: AC
  Administered 2020-01-20: 0.5 mg via INTRAVENOUS
  Filled 2020-01-20: qty 1

## 2020-01-20 MED ORDER — SODIUM CHLORIDE 0.9 % IV SOLN
2.0000 g | INTRAVENOUS | Status: DC
  Administered 2020-01-20 – 2020-01-22 (×3): 2 g via INTRAVENOUS
  Filled 2020-01-20: qty 2
  Filled 2020-01-20: qty 20
  Filled 2020-01-20: qty 2

## 2020-01-20 MED ORDER — CHLORHEXIDINE GLUCONATE CLOTH 2 % EX PADS
6.0000 | MEDICATED_PAD | Freq: Every day | CUTANEOUS | Status: DC
  Administered 2020-01-20 – 2020-02-08 (×21): 6 via TOPICAL

## 2020-01-20 MED ORDER — SODIUM CHLORIDE 0.9 % IV SOLN
500.0000 mg | INTRAVENOUS | Status: DC
  Administered 2020-01-20 – 2020-01-22 (×3): 500 mg via INTRAVENOUS
  Filled 2020-01-20 (×3): qty 500

## 2020-01-20 NOTE — Progress Notes (Signed)
Pt transferred to Rm # [id number] in ICU with Charge RN Shon Hale, RT Angie with HHFNC 65% and NRB. Pt oxygen sats 93% during transfer. All pt belonging's with pt in storage bin.

## 2020-01-20 NOTE — Progress Notes (Signed)
RT placed pt back on NRB d/t sats unable to maintain 85%. Tolerating well, sating between 85 - 90%. Will monitor closely

## 2020-01-20 NOTE — Progress Notes (Signed)
Pt unable to do cpt pt is confused.

## 2020-01-20 NOTE — Progress Notes (Addendum)
Pt's  Oxygen desaturated in the 76-78%. This RN and charge RN Shon Hale went in to assess pt. Angie RT notified and aware. RT provided pt with new NRB mask. Pt not complaining of any distress. Pt oxygen rechecked and is saturating in 90-93% with 50L HHFNC and NRB mask. Will continue to monitor closely.

## 2020-01-20 NOTE — Progress Notes (Signed)
Spoke to pt's husband [name] and informed him his wife will be going to room 1224. Phone number for nursing station in step-down ICU provided. All questions answered at this time.

## 2020-01-20 NOTE — Progress Notes (Signed)
Pt is alert and oriented x3 when assessed this AM. Not oriented to place. Pt does not complain of pain. Pt resting comfortably at time of assessment. Safety sitter at bedside. VSS. Oxygen saturations in 80's this AM.  Will monitor pt.

## 2020-01-20 NOTE — Progress Notes (Signed)
Report called to [facility] in ICU. All questions answered. Will transport pt to room 1224 once room is clean.

## 2020-01-20 NOTE — Progress Notes (Signed)
Came to room for routine check of pt. Noticed on the monitor pts o2 saturations were 38. Went inside room pt had high flow pulled off and NRB off. Pt is yelling I need to get up I need to get out of here. Rt placed pts o2 back on sats recovered slowly to 92%. I tried to comfort pt and explain importance of O2. Called RN with no anwser. Charge Nurse called and notified of situation. Charge nurse called AC to possibly move pt to icu or get a sitter for the pt.

## 2020-01-20 NOTE — Progress Notes (Signed)
Very restless tonight. PRN temazepam was ineffective, PRN oral ativan administered. Decreased anxiety, continues to try and leave bed as well as pulling off oxygen. Educated on importance of oxygen and requirements, pt verbalized understanding. Impulsive behavior continues.

## 2020-01-20 NOTE — Progress Notes (Signed)
PT not able to tolerate CPT at this time.

## 2020-01-20 NOTE — Progress Notes (Signed)
PT Cancellation Note  Patient Details Name: Julie Mcguire MRN: [id number] DOB: 1955-11-25   Cancelled Treatment:    Reason Eval/Treat Not Completed: Medical issues which prohibited therapy--pt to transfer to ICU. Will hold PT today and check back another day.    Faye Ramsay, PT Acute Rehabilitation  Office: [phone] Pager: [phone]

## 2020-01-20 NOTE — Progress Notes (Signed)
Spoke to Julie Mcguire's husband [name] with updates on Julie Mcguire. [name] understood all interventions that have been performed on Julie Mcguire's wife. Informed [name] that his wife will be transferred to step down ICU and that a room number is not available at this time due to room in step down ICU being occupied. Will provide further updates on room number once notified. All questions answered at this time.

## 2020-01-20 NOTE — Consult Note (Signed)
Consultation Note Date: 01/20/2020   Patient Name: Julie Mcguire  DOB: [birth year]  MRN: [id number]  Age / Sex: 64 y.o., female   PCP: Elias Else, MD Referring Physician: [place], Eric J, DO   REASON FOR CONSULTATION:Establishing goals of care  Palliative Care consult requested for goals of care discussion in this 64 y.o. female with multiple medical problems including tension, obesity, and Covid pneumonia.  Patient recently discharged from the hospital after Covid diagnosis and management of pneumonia.  She was discharged on 4 L of home oxygen at that time.  Per notation patient reported becoming increasingly more short of breath, weak, with persistent cough post discharge.  Patient received IV steroids, remdesivir, and baricitinib during previous admission.  During ED work-up patient was found to be hypoxic with oxygen saturations in the 70s on 6 L nasal cannula.  Oxygenation increased to 85% with 15 L nonrebreather.  Patient unable to speak in full sentences due to respiratory distress.  Since admission patient continues to require significant amount of oxygen support warranting 60 L of high flow nasal cannula and 15 L nonrebreather.  Next x-ray showed moderate to severe bilateral patchy infiltrates.  Clinical Assessment and Goals of Care: I have reviewed medical records including lab results, imaging, Epic notes, and MAR, received report from the bedside RN, and assessed the patient.Due to Mrs. Chavous's increased work of breathing she is being transferred to [facility] for continued care. Permission given to speak with her husband. I was able to speak with Edmon Crape (patient's husband) via phone to discuss diagnosis prognosis, GOC, EOL wishes, disposition and options.  I introduced Palliative Medicine as specialized medical care for people living with serious illness. It focuses on providing relief from the symptoms and stress of a serious illness. The goal is to improve quality of life  for both the patient and the family. He verbalized understanding and appreciation of support.   We discussed a brief life review of the patient, along with her functional and nutritional status.  Patient and her husband have been married for over 26 years.  They have a set of twins age 53 years old.  Patient is a recently retired Air[employer] pilot.  Husband shares both he, his wife, and daughter all were tested positive for Covid.  He and daughter are doing fairly well with a lingering cough however patient continued to show signs of struggle since contracting the virus.  He reports patient does not have a diagnosed respiratory illness however prior to Covid she would become easily fatigued and somewhat short of breath with walking long distances or extraneous exercise.  Otherwise patient was able to perform all ADLs independently with no major health concerns.  We discussed Her current illness and what it means in the larger context of Her on-going co-morbidities. With specific discussions regarding patient's significant respiratory distress and oxygenation requirements in the setting of COVID-19 viral pneumonia.  Natural disease trajectory and expectations at EOL were discussed.  Mr. luisana lutzke understanding of patient's current illness.  He is tearful and expressing his fear of what will happen next and his feelings of helplessness on his wife's behalf.  Therapeutic listening and support provided.  We discussed at length patient's respiratory condition and oxygen demand.  Education provided on the use of possible BiPAP if required in addition to possibility of needing intubation/ventilation worse case scenario.  Husband verbalized understanding.  I attempted to elicit values and goals of care important to the patient.  Mr. Guerin is clear that he wishes for continued aggressive interventions for his wife.  He reports they have had conversations regarding their wishes and he feels at this point in  the setting of Covid both she and he would want all interventions to allow every opportunity to show improvement even if that requires life-sustaining measures.  Education provided on best case and worst case scenarios.  Husband verbalized understanding and is remaining hopeful for the best but also preparing for the worse.  He verbalizes if patient required intubation he would not wish to prolong if she did not show signs of meaningful recovery or required more extensive interventions such as tracheostomy.  At that point he would then consider more of a comfort approach.  Again expressing hopefulness that they are not pleased with such decisions or challenges.  Mr. Fellenz confirms wishes for wife to remain a full code/full aggressive interventions.  Questions and concerns were addressed. The family was encouraged to call with questions or concerns.  PMT will continue to support holistically.   SOCIAL HISTORY:     reports that she quit smoking about 24 years ago. She has a 15.00 pack-year smoking history. She has never used smokeless tobacco. She reports current alcohol use. She reports that she does not use drugs.  CODE STATUS: Full code  ADVANCE DIRECTIVES: Primary Decision Maker: Edmon Crape (husband)   SYMPTOM MANAGEMENT: per attending   Palliative Prophylaxis:   Frequent Pain Assessment and respiratory support  PSYCHO-SOCIAL/SPIRITUAL:  Support System: Family  Desire for further Chaplaincy support:No   Additional Recommendations (Limitations, Scope, Preferences):  Full Scope Treatment   PAST MEDICAL HISTORY: Past Medical History:  Diagnosis Date   Hypertension     ALLERGIES:  is allergic to codeine.   MEDICATIONS:  Current Facility-Administered Medications  Medication Dose Route Frequency Provider Last Rate Last Admin   acetaminophen (TYLENOL) tablet 650 mg  650 mg Oral Q6H PRN [place], Eric J, DO   650 mg at 01/19/20 1329   albuterol (VENTOLIN HFA) 108 (90 Base)  MCG/ACT inhaler 2 puff  2 puff Inhalation Q6H Adhikari, Amrit, MD   2 puff at 01/20/20 0935   amLODipine (NORVASC) tablet 10 mg  10 mg Oral Daily Burnadette Pop, MD   10 mg at 01/20/20 5732   ascorbic acid (VITAMIN C) tablet 500 mg  500 mg Oral Daily Adhikari, Willia Craze, MD   500 mg at 01/20/20 0936   azithromycin (ZITHROMAX) 500 mg in sodium chloride 0.9 % 250 mL IVPB  500 mg Intravenous Q24H [place], Eric J, DO 250 mL/hr at 01/20/20 1314 500 mg at 01/20/20 1314   baricitinib (OLUMIANT) tablet 2 mg  2 mg Oral Daily [place], Alvira Philips, DO   2 mg at 01/20/20 0936   cefTRIAXone (ROCEPHIN) 2 g in sodium chloride 0.9 % 100 mL IVPB  2 g Intravenous Q24H [place], Alvira Philips, DO 200 mL/hr at 01/20/20 1234 2 g at 01/20/20 1234   enoxaparin (LOVENOX) injection 60 mg  60 mg Subcutaneous Q24H Adhikari, Amrit, MD   60 mg at 01/19/20 1811   guaiFENesin-dextromethorphan (ROBITUSSIN DM) 100-10 MG/5ML syrup 10 mL  10 mL Oral Q4H PRN Burnadette Pop, MD   10 mL at 01/16/20 0450   insulin aspart (novoLOG) injection 0-9 Units  0-9 Units Subcutaneous TID WC Burnadette Pop, MD   2 Units at 01/20/20 1228   LORazepam (ATIVAN) tablet 0.5 mg  0.5 mg Oral Q6H PRN [place], Eric J, DO   0.5 mg at 01/19/20 2355  methocarbamol (ROBAXIN) tablet 500 mg  500 mg Oral Q6H PRN Burnadette Pop, MD   500 mg at 01/20/20 0434   methylPREDNISolone sodium succinate (SOLU-MEDROL) 125 mg/2 mL injection 60 mg  60 mg Intravenous Q6H Adhikari, Amrit, MD   60 mg at 01/20/20 1227   metoprolol succinate (TOPROL-XL) 24 hr tablet 100 mg  100 mg Oral Daily Adhikari, Amrit, MD   100 mg at 01/20/20 0936   ondansetron (ZOFRAN) injection 4 mg  4 mg Intravenous Q8H PRN Marikay Alar, FNP   4 mg at 01/20/20 0434   oxybutynin (DITROPAN) tablet 5 mg  5 mg Oral BID Burnadette Pop, MD   5 mg at 01/20/20 0936   polyethylene glycol (MIRALAX / GLYCOLAX) packet 17 g  17 g Oral Daily PRN Burnadette Pop, MD       prochlorperazine (COMPAZINE) injection 10 mg   10 mg Intravenous Q6H PRN [place], Eric J, DO   10 mg at 01/19/20 1100   temazepam (RESTORIL) capsule 22.5 mg  22.5 mg Oral QHS PRN Eduard Clos, MD   22.5 mg at 01/19/20 2207   zinc sulfate capsule 220 mg  220 mg Oral Daily Adhikari, Willia Craze, MD   220 mg at 01/20/20 0936    VITAL SIGNS: BP 125/75    Pulse 95    Temp 98 F (36.7 C)    Resp (!) 21    Ht 5\' 4"  (1.626 m)    Wt 114.3 kg    SpO2 91%    BMI 43.25 kg/m  Filed Weights   01/15/20 1916 01/19/20 0500 01/20/20 0441  Weight: 119 kg 115.8 kg 114.3 kg    Estimated body mass index is 43.25 kg/m as calculated from the following:   Height as of this encounter: 5\' 4"  (1.626 m).   Weight as of this encounter: 114.3 kg.  LABS: CBC:    Component Value Date/Time   WBC 24.4 (H) 01/20/2020 0957   HGB 13.2 01/20/2020 0957   HCT 41.2 01/20/2020 0957   PLT 202 01/20/2020 0957   Comprehensive Metabolic Panel:    Component Value Date/Time   NA 148 (H) 01/20/2020 0957   K 3.9 01/20/2020 0957   BUN 62 (H) 01/20/2020 0957   CREATININE 1.12 (H) 01/20/2020 0957   ALBUMIN 3.1 (L) 01/20/2020 0957     Review of Systems  Respiratory: Positive for cough and shortness of breath.   Neurological: Positive for weakness.  Unless otherwise noted, a complete review of systems is negative.  Physical Exam General: increased respiratory efforts, obese, ill appearing Cardiovascular: tachycardic Pulmonary: rhonchi, diminished bilaterally, 60 HFNC and 15 NRB Neurological: alert and oriented, fatigued    Prognosis: GUARDED   Discharge Planning:  To Be Determined  Recommendations:  Full Code-as confirmed by husband   Continue with current plan of care per medical team, full scope/aggressive interventions  Husband remains hopeful for improvement but also preparing for the worst. Wishes to continue with aggressive interventions in the setting of COVID to allow patient every opportunity to improve. Would not want to prolong life if  decisions came to tracheostomy or patient showed no signs of meaningful recovery   PMT will continue to support and follow. Please call team line with urgent needs.   Palliative Performance Scale:               Husband expressed understanding and was in agreement with this plan.   Thank you for allowing the Palliative Medicine Team to assist in the care  of this patient.  Time In: 1445 Time Out: 1540 Time Total: 55 min.   Visit consisted of counseling and education dealing with the complex and emotionally intense issues of symptom management and palliative care in the setting of serious and potentially life-threatening illness.Greater than 50%  of this time was spent counseling and coordinating care related to the above assessment and plan.  Signed by:  Willette AlmaNikki Pickenpack-Cousar, AGPCNP-BC Palliative Medicine Team  Phone: [phone] Pager: [phone] Amion: Thea AlkenN. Cousar

## 2020-01-20 NOTE — Progress Notes (Signed)
PROGRESS NOTE    Julie MarionDeborah M Mcguire  WUJ:[id number] DOB: Apr 03, 1956 DOA: Oct 06, 2019 PCP: Elias Elseeade, Robert, MD    Brief Narrative:  Julie Mcguire is a 64 y.o. female with medical history significant of recent Covid pneumonia, hypertension, moderate obesity who presented to the emergency room with complaints of worsening shortness of breath.   Recently discharged from the hospital on 01/10/2020 following treatment for Covid-19 viral pneumonia with steroids, remdesivir, and baricitinib.  She was discharged on 4 L of oxygen per minute. On EMS arrival, she was hypoxic and was saturating 75% on 6 L of nasal cannula.  Saturation good to 85% with 15 L nonrebreather mask.  Patient was then brought to the emergency department.  As expected, patient has not been vaccinated. She she also mentioned that her husband has Covid at home but not sick as her.  She says she is feeling very weak, has some stomach upset but denies having any nausea, vomiting, diarrhea, headache or dysuria.  In the ED, she was tachypneic, tachycardic, hypertensive on arrival.    Lab work showed elevated inflammatory markers with CRP in the range of 12. Chest x-ray showed moderate to marked severe bilateral patchy infiltrates. Patient admitted for the management of severe refractory Covid pneumonia.   Assessment & Plan:   Principal Problem:   Acute respiratory failure with hypoxia (HCC) Active Problems:   Pneumonia due to COVID-19 virus   Essential hypertension   Obesity, Class III, BMI 40-49.9 (morbid obesity) (HCC)   Acute hypoxic respiratory failure secondary to acute Covid-19 viral pneumonia during the ongoing 2020/2021 Covid 19 Pandemic - POA Patient representing to the ED following recent discharge 4 days prior for progressive shortness of breath despite treatment for Covid-19 with remdesivir, baricitinib and steroids.  Was discharged home on 4 L nasal cannula, notably hypoxic requiring 15 L NRB upon ED presentation.  Chest  x-ray with moderate to marked severity bilateral patchy multifocal infiltrates.  Procalcitonin less than 0.10. --COVID test: PCR + 01/07/2020 --CRP 2.2>12.5>8.5>3.0>1.1>0.9>2.0 --ddimer 1.06>1.72>3.77>3.27>3.30>5.76>2.68 --Completed 5-day course of remdesivir on 9/4 --Baricitinib 4mg  PO daily (Day #7/14) --Solumedrol 60 mg IV every 6 hours --prone for 2-3hrs every 12hrs if able --Continue supplemental oxygen, titrate to maintain SPO2 greater than 92%, currently on 60L HFNC and 15L NRB SPO2 93% at rest --Continue supportive care with albuterol MDI prn, vitamin C, zinc, Tylenol, antitussives (benzonatate/ Mucinex/Tussionex) --Incentive spirometry, flutter valve --Follow CBC, CMP, D-dimer, ferritin, and CRP daily --Continue airborne/contact isolation precautions for 3 weeks from the day of diagnosis --Palliative care consultation for assistance with goals of care and medical decision making  The treatment plan and use of medications and known side effects were discussed with patient/family. Some of the medications used are based on case reports/anecdotal data.  All other medications being used in the management of COVID-19 based on limited study data.  Complete risks and long-term side effects are unknown, however in the best clinical judgment they seem to be of some benefit.  Patient wanted to proceed with treatment options provided.  Secondary bacterial pneumonia Patient's WBC count up trending 9.2, 19.3, 24.4 over the past 3 days.  Repeat chest x-ray this morning shows bilateral airspace infiltration with increasing consolidation and air bronchograms on the left, all signs concerning for now development of a secondary pneumonia from her underlying initial Covid-19 viral pneumonia. --Urine Legionella/strep pneumo antigen --Sputum culture --Start azithromycin 500 mg daily x5 days --Start ceftriaxone 2 g IV every 24 hours x7 days  Elevated D-dimer --ddimer  1.06>1.72>3.77>3.27>3.30>5.76>2.68 --Vascular duplex venous ultrasound bilateral lower extremities negative for DVT --CT angiogram chest: Negative for PE  Anxiety: Ativan 0.5 mg every 6 hours as needed  Essential hypertension BP 122/71 this morning, well controlled. --Continue amlodipine 10 mg p.o. daily --Continue metoprolol succinate 100 mg p.o. daily  Morbid obesity BMI 45.03 kg/m.  Discussed with patient need for aggressive lifestyle changes, weight loss as this complicates all facets of care.   DVT prophylaxis: Lovenox Code Status: Full code Family Communication: Updated patient extensively at bedside, updated patient spouse, [name] via telephone this morning  Disposition Plan:  Status is: Inpatient  Remains inpatient appropriate because:Ongoing diagnostic testing needed not appropriate for outpatient work up, Unsafe d/c plan, IV treatments appropriate due to intensity of illness or inability to take PO and Inpatient level of care appropriate due to severity of illness continues on high FiO2 requirements, now up to 60 L HFNC/NRB, prognosis guarded; palliative care consult pending   Dispo: The patient is from: Home              Anticipated d/c is to: To be determined              Anticipated d/c date is: > 3 days              Patient currently is not medically stable to d/c.   Consultants:   Palliative care  Procedures:   None  Antimicrobials:   Remdesivir 8/31 - 9/4  Baricitinib 8/31>>   Subjective: Patient seen and examined bedside, sleeping but arousable.  Continues on 60 L high flow nasal cannula with 15 L NRB.  Patient complains of significant weakness and fatigue.  Denies headache, no dizziness, no fever/chills/night sweats, no nausea/vomiting/diarrhea, no chest pain, no palpitations, no abdominal pain. No acute events overnight per nursing staff.  Objective: Vitals:   01/20/20 0611 01/20/20 0650 01/20/20 0813 01/20/20 0932  BP:    125/75  Pulse:    87   Resp: 17 18 (!) 23 (!) 21  Temp:    98 F (36.7 C)  TempSrc:      SpO2:   91% 93%  Weight:      Height:        Intake/Output Summary (Last 24 hours) at 01/20/2020 1035 Last data filed at 01/20/2020 0530 Gross per 24 hour  Intake --  Output 700 ml  Net -700 ml   Filed Weights   01/15/20 1916 01/19/20 0500 01/20/20 0441  Weight: 119 kg 115.8 kg 114.3 kg    Examination:  General exam: Appears calm and comfortable Respiratory system: Decreased breath sounds bilaterally with rhonchi, normal respiratory effort, no accessory muscle use, on 60L HFNC and 15L NRB Cardiovascular system: S1 & S2 heard, RRR. No JVD, murmurs, rubs, gallops or clicks. No pedal edema. Gastrointestinal system: Abdomen is nondistended, soft and nontender. No organomegaly or masses felt. Normal bowel sounds heard. Central nervous system: Alert and oriented. No focal neurological deficits. Extremities: Symmetric 5 x 5 power. Skin: No rashes, lesions or ulcers Psychiatry: Judgement and insight appear normal.  Depressed mood, flat affect    Data Reviewed: I have personally reviewed following labs and imaging studies  CBC: Recent Labs  Lab 01/15/20 0536 01/15/20 0536 01/16/20 0234 01/17/20 0431 01/18/20 0448 01/19/20 0554 01/20/20 0957  WBC 10.7*   < > 13.8* 15.0* 9.2 19.3* 24.4*  NEUTROABS 9.2*  --  11.4* 12.7* 8.1* 17.2*  --   HGB 12.6   < > 12.9 12.9 12.2 13.1 13.2  HCT 39.7   < > 39.9 41.0 37.4 40.6 41.2  MCV 93.9   < > 92.8 95.6 91.9 93.3 94.5  PLT 447*   < > 392 321 243 278 202   < > = values in this interval not displayed.   Basic Metabolic Panel: Recent Labs  Lab 01/15/20 0536 01/15/20 0536 01/16/20 0234 01/17/20 0431 01/18/20 0448 01/19/20 0554 01/20/20 0957  NA 138   < > 141 144 144 145 148*  K 4.3   < > 3.8 3.7 3.7 3.5 3.9  CL 101   < > 104 103 100 98 99  CO2 25   < > 27 29 34* 34* 38*  GLUCOSE 158*   < > 173* 163* 158* 155* 167*  BUN 18   < > 25* 41* 64* 75* 62*  CREATININE  0.73   < > 0.83 1.06* 1.40* 1.33* 1.12*  CALCIUM 8.6*   < > 8.7* 8.6* 8.6* 8.9 9.0  MG 2.2   < > 2.3 2.4 2.3 2.5* 2.9*  PHOS 3.9  --  3.6 4.4 5.0* 4.6  --    < > = values in this interval not displayed.   GFR: Estimated Creatinine Clearance: 65.4 mL/min (A) (by C-G formula based on SCr of 1.12 mg/dL (H)). Liver Function Tests: Recent Labs  Lab 01/16/20 0234 01/17/20 0431 01/18/20 0448 01/19/20 0554 01/20/20 0957  AST 26 18 16 16 16   ALT 44 34 28 27 25   ALKPHOS 65 57 51 54 52  BILITOT 0.7 0.5 0.5 0.6 0.7  PROT 6.6 6.1* 5.6* 6.1* 6.5  ALBUMIN 2.8* 2.8* 2.7* 3.0* 3.1*   No results for input(s): LIPASE, AMYLASE in the last 168 hours. No results for input(s): AMMONIA in the last 168 hours. Coagulation Profile: No results for input(s): INR, PROTIME in the last 168 hours. Cardiac Enzymes: No results for input(s): CKTOTAL, CKMB, CKMBINDEX, TROPONINI in the last 168 hours. BNP (last 3 results) No results for input(s): PROBNP in the last 8760 hours. HbA1C: No results for input(s): HGBA1C in the last 72 hours. CBG: Recent Labs  Lab 01/19/20 0807 01/19/20 1109 01/19/20 1656 01/19/20 2208 01/20/20 0833  GLUCAP 159* 253* 247* 148* 159*   Lipid Profile: No results for input(s): CHOL, HDL, LDLCALC, TRIG, CHOLHDL, LDLDIRECT in the last 72 hours. Thyroid Function Tests: No results for input(s): TSH, T4TOTAL, FREET4, T3FREE, THYROIDAB in the last 72 hours. Anemia Panel: No results for input(s): VITAMINB12, FOLATE, FERRITIN, TIBC, IRON, RETICCTPCT in the last 72 hours. Sepsis Labs: Recent Labs  Lab 12/18/2019 1554  PROCALCITON <0.10  LATICACIDVEN 1.8    Recent Results (from the past 240 hour(s))  Blood Culture (routine x 2)     Status: None   Collection Time: 01/09/2020  3:54 PM   Specimen: BLOOD  Result Value Ref Range Status   Specimen Description   Final    BLOOD RIGHT ANTECUBITAL Performed at [facility], [address], [place], [place]     Special Requests   Final    BOTTLES DRAWN AEROBIC AND ANAEROBIC Blood Culture results may not be optimal due to an inadequate volume of blood received in culture bottles Performed at [facility], [address] [place], [place]    Culture   Final    NO GROWTH 5 DAYS Performed at [facility], [address] [place], [address] [place]    Report Status 01/19/2020 FINAL  Final  Blood Culture (routine x  2)     Status: Abnormal   Collection Time: 01/06/2020  4:00 PM   Specimen: BLOOD LEFT HAND  Result Value Ref Range Status   Specimen Description   Final    BLOOD LEFT HAND Performed at [facility], [address], [place], [place] [address]    Special Requests   Final    BOTTLES DRAWN AEROBIC AND ANAEROBIC Blood Culture adequate volume Performed at [facility], [address] [place], [place] [address]    Culture  Setup Time   Final    GRAM POSITIVE COCCI IN CLUSTERS ANAEROBIC BOTTLE ONLY Organism ID to follow CRITICAL RESULT CALLED TO, READ BACK BY AND VERIFIED WITH: Damaris Hippo [facility] 01/15/20 2154 JDW    Culture (A)  Final    STAPHYLOCOCCUS EPIDERMIDIS THE SIGNIFICANCE OF ISOLATING THIS ORGANISM FROM A SINGLE SET OF BLOOD CULTURES WHEN MULTIPLE SETS ARE DRAWN IS UNCERTAIN. PLEASE NOTIFY THE MICROBIOLOGY DEPARTMENT WITHIN ONE WEEK IF SPECIATION AND SENSITIVITIES ARE REQUIRED. Performed at [facility], [address], [place], [place] [address]    Report Status 01/17/2020 FINAL  Final  Blood Culture ID Panel (Reflexed)     Status: Abnormal   Collection Time: 01/13/2020  4:00 PM  Result Value Ref Range Status   Enterococcus faecalis NOT DETECTED NOT DETECTED Final   Enterococcus Faecium NOT DETECTED NOT DETECTED Final   Listeria monocytogenes NOT DETECTED NOT DETECTED Final   Staphylococcus species DETECTED (A) NOT DETECTED Final    Comment: CRITICAL RESULT CALLED TO, READ BACK BY AND VERIFIED WITH: Damaris Hippo [facility] 01/15/20 2154 JDW    Staphylococcus aureus (BCID) NOT DETECTED NOT DETECTED Final   Staphylococcus epidermidis DETECTED (A) NOT DETECTED Final    Comment: CRITICAL RESULT CALLED TO, READ BACK BY AND VERIFIED WITH: Damaris Hippo [facility] 01/15/20 2154 JDW    Staphylococcus lugdunensis NOT DETECTED NOT DETECTED Final   Streptococcus species NOT DETECTED NOT DETECTED Final   Streptococcus agalactiae NOT DETECTED NOT DETECTED Final   Streptococcus pneumoniae NOT DETECTED NOT DETECTED Final   Streptococcus pyogenes NOT DETECTED NOT DETECTED Final   A.calcoaceticus-baumannii NOT DETECTED NOT DETECTED Final   Bacteroides fragilis NOT DETECTED NOT DETECTED Final   Enterobacterales NOT DETECTED NOT DETECTED Final   Enterobacter cloacae complex NOT DETECTED NOT DETECTED Final   Escherichia coli NOT DETECTED NOT DETECTED Final   Klebsiella aerogenes NOT DETECTED NOT DETECTED Final   Klebsiella oxytoca NOT DETECTED NOT DETECTED Final   Klebsiella pneumoniae NOT DETECTED NOT DETECTED Final   Proteus species NOT DETECTED NOT DETECTED Final   Salmonella species NOT DETECTED NOT DETECTED Final   Serratia marcescens NOT DETECTED NOT DETECTED Final   Haemophilus influenzae NOT DETECTED NOT DETECTED Final   Neisseria meningitidis NOT DETECTED NOT DETECTED Final   Pseudomonas aeruginosa NOT DETECTED NOT DETECTED Final   Stenotrophomonas maltophilia NOT DETECTED NOT DETECTED Final   Candida albicans NOT DETECTED NOT DETECTED Final   Candida auris NOT DETECTED NOT DETECTED Final   Candida glabrata NOT DETECTED NOT DETECTED Final   Candida krusei NOT DETECTED NOT DETECTED Final   Candida parapsilosis NOT DETECTED NOT DETECTED Final   Candida tropicalis NOT DETECTED NOT DETECTED Final   Cryptococcus neoformans/gattii NOT DETECTED NOT DETECTED Final   Methicillin resistance mecA/C NOT DETECTED NOT DETECTED Final    Comment: Performed at [facility], [address], [place], [place]  [address]         Radiology Studies: DG  CHEST PORT 1 VIEW  Result Date: 01/20/2020 CLINICAL DATA:  Dyspnea EXAM: PORTABLE CHEST 1 VIEW COMPARISON:  02/07/2020 FINDINGS: Shallow inspiration. Bilateral airspace infiltration with increasing consolidation and air bronchograms on the left since previous study. No pleural effusions. No pneumothorax. Heart size is obscured. IMPRESSION: Bilateral airspace infiltration with increasing consolidation and air bronchograms on the left. Electronically Signed   By: Burman Nieves M.D.   On: 01/20/2020 05:00        Scheduled Meds: . albuterol  2 puff Inhalation Q6H  . amLODipine  10 mg Oral Daily  . vitamin C  500 mg Oral Daily  . baricitinib  2 mg Oral Daily  . enoxaparin (LOVENOX) injection  60 mg Subcutaneous Q24H  . insulin aspart  0-9 Units Subcutaneous TID WC  . methylPREDNISolone (SOLU-MEDROL) injection  60 mg Intravenous Q6H  . metoprolol succinate  100 mg Oral Daily  . oxybutynin  5 mg Oral BID  . zinc sulfate  220 mg Oral Daily   Continuous Infusions:    LOS: 6 days    Time spent: 42 minutes spent on chart review, discussion with nursing staff, consultants, updating family and interview/physical exam; more than 50% of that time was spent in counseling and/or coordination of care.    Alvira Philips [place], DO [facility] Available via Epic secure chat 7am-7pm After these hours, please refer to coverage provider listed on [email or web address] 01/20/2020, 10:35 AM

## 2020-01-21 ENCOUNTER — Inpatient Hospital Stay (HOSPITAL_COMMUNITY): Payer: BC Managed Care – PPO

## 2020-01-21 LAB — COMPREHENSIVE METABOLIC PANEL
ALT: 23 U/L (ref 0–44)
AST: 18 U/L (ref 15–41)
Albumin: 2.8 g/dL — ABNORMAL LOW (ref 3.5–5.0)
Alkaline Phosphatase: 47 U/L (ref 38–126)
Anion gap: 12 (ref 5–15)
BUN: 52 mg/dL — ABNORMAL HIGH (ref 8–23)
CO2: 35 mmol/L — ABNORMAL HIGH (ref 22–32)
Calcium: 8.6 mg/dL — ABNORMAL LOW (ref 8.9–10.3)
Chloride: 102 mmol/L (ref 98–111)
Creatinine, Ser: 1.07 mg/dL — ABNORMAL HIGH (ref 0.44–1.00)
GFR calc Af Amer: >60 mL/min (ref >60)
GFR calc non Af Amer: 56 mL/min — ABNORMAL LOW (ref >60)
Glucose, Bld: 195 mg/dL — ABNORMAL HIGH (ref 70–99)
Potassium: 4 mmol/L (ref 3.5–5.1)
Sodium: 149 mmol/L — ABNORMAL HIGH (ref 135–145)
Total Bilirubin: 0.9 mg/dL (ref 0.3–1.2)
Total Protein: 6.2 g/dL — ABNORMAL LOW (ref 6.5–8.1)

## 2020-01-21 LAB — D-DIMER, QUANTITATIVE: D-Dimer, Quant: 2.62 ug/mL-FEU — ABNORMAL HIGH (ref 0.00–0.50)

## 2020-01-21 LAB — CBC
HCT: 40.3 % (ref 36.0–46.0)
Hemoglobin: 12.8 g/dL (ref 12.0–15.0)
MCH: 30.5 pg (ref 26.0–34.0)
MCHC: 31.8 g/dL (ref 30.0–36.0)
MCV: 96.2 fL (ref 80.0–100.0)
Platelets: 218 10*3/uL (ref 150–400)
RBC: 4.19 MIL/uL (ref 3.87–5.11)
RDW: 13.9 % (ref 11.5–15.5)
WBC: 20.8 10*3/uL — ABNORMAL HIGH (ref 4.0–10.5)
nRBC: 0 % (ref 0.0–0.2)

## 2020-01-21 LAB — GLUCOSE, CAPILLARY
Glucose-Capillary: 168 mg/dL — ABNORMAL HIGH (ref 70–99)
Glucose-Capillary: 174 mg/dL — ABNORMAL HIGH (ref 70–99)
Glucose-Capillary: 174 mg/dL — ABNORMAL HIGH (ref 70–99)
Glucose-Capillary: 165 mg/dL — ABNORMAL HIGH (ref 70–99)

## 2020-01-21 LAB — MRSA PCR SCREENING: MRSA by PCR: NEGATIVE

## 2020-01-21 LAB — MAGNESIUM: Magnesium: 2.8 mg/dL — ABNORMAL HIGH (ref 1.7–2.4)

## 2020-01-21 LAB — PROCALCITONIN: Procalcitonin: <0.1 ng/mL

## 2020-01-21 LAB — C-REACTIVE PROTEIN: CRP: 3.5 mg/dL — ABNORMAL HIGH (ref <1.0)

## 2020-01-21 MED ORDER — LORAZEPAM 2 MG/ML IJ SOLN
0.5000 mg | Freq: Four times a day (QID) | INTRAMUSCULAR | Status: DC | PRN
  Administered 2020-01-21 (×2): 0.5 mg via INTRAVENOUS
  Filled 2020-01-21 (×4): qty 1

## 2020-01-21 MED ORDER — LORAZEPAM 0.5 MG PO TABS
0.5000 mg | ORAL_TABLET | Freq: Four times a day (QID) | ORAL | Status: DC | PRN
  Administered 2020-01-22: 0.5 mg via ORAL
  Filled 2020-01-21: qty 1

## 2020-01-21 NOTE — Progress Notes (Signed)
Physical Therapy Treatment Patient Details Name: Julie Mcguire MRN: [id number] DOB: Sep 01, 1955 Today's Date: 01/21/2020    History of Present Illness Julie Mcguire is a 64 y.o. female with medical history significant of recent Covid pneumonia, hypertension, moderate obesity who presented to the emergency room with complaints of worsening shortness of breath.  Recently discharged from the hospital on 01/10/2020 following treatment for Covid-19 viral pneumonia.She was discharged on 4 L of oxygen and returned with worsening short of breath.    PT Comments    Pt sleepy however oriented x2 and  Following one step commands consistently. Pt able to long sit in bed and wt shift laterally with multi-modal cues to place maxi-sky pad.  Pt asking to be OOB and nods she is pleased to bed up; Pt on 40L HHFNC, 90% FiO2.  SpO2= 85-90s during PT session. Will need SNF placement, recommendations updated. Continue PT POC  Follow Up Recommendations  SNF     Equipment Recommendations  None recommended by PT    Recommendations for Other Services       Precautions / Restrictions Precautions Precautions: Fall;Other (comment) Precaution Comments: Monitor O2 VS    Mobility  Bed Mobility Overal bed mobility: Needs Assistance             General bed mobility comments: OOB to chair with maxisky   Transfers Overall transfer level: Needs assistance                  Ambulation/Gait                 Stairs             Wheelchair Mobility    Modified Rankin (Stroke Patients Only)       Balance     Sitting balance-Leahy Scale: Poor (able to long sit in bed holding with bedrails)                                      Cognition Arousal/Alertness: Lethargic (sleepy ) Behavior During Therapy: Flat affect Overall Cognitive Status: Within Functional Limits for tasks assessed                                 General Comments: falling asleep  sitting up in bed, oriented to self and place.  follows one step commands      Exercises General Exercises - Lower Extremity Ankle Circles/Pumps: AROM;Both;10 reps    General Comments        Pertinent Vitals/Pain Pain Assessment: No/denies pain    Home Living                      Prior Function            PT Goals (current goals can now be found in the care plan section) Acute Rehab PT Goals Patient Stated Goal: To improve endurance PT Goal Formulation: With patient Time For Goal Achievement: 01/31/20 Potential to Achieve Goals: Good Progress towards PT goals: Progressing toward goals    Frequency    Min 2X/week      PT Plan Discharge plan needs to be updated    Co-evaluation              AM-PAC PT "6 Clicks" Mobility   Outcome Measure  Help needed turning from your back to  your side while in a flat bed without using bedrails?: A Little Help needed moving from lying on your back to sitting on the side of a flat bed without using bedrails?: A Little Help needed moving to and from a bed to a chair (including a wheelchair)?: A Little Help needed standing up from a chair using your arms (e.g., wheelchair or bedside chair)?: A Little Help needed to walk in hospital room?: A Little Help needed climbing 3-5 steps with a railing? : A Lot 6 Click Score: 17    End of Session Equipment Utilized During Treatment: Oxygen Activity Tolerance: Patient limited by fatigue;Patient limited by lethargy Patient left: in chair;with call bell/phone within reach;with chair alarm set;with nursing/sitter in room Nurse Communication: Mobility status PT Visit Diagnosis: Other abnormalities of gait and mobility (R26.89)     Time: 0165-5374 PT Time Calculation (min) (ACUTE ONLY): 26 min  Charges:  $Therapeutic Activity: 23-37 mins                     Delice Bison, PT  Acute Rehab Dept ([facility]) [phone] Pager [phone]  01/21/2020    [facility] 01/21/2020, 1:28  PM

## 2020-01-21 NOTE — TOC Initial Note (Signed)
Transition of Care [facility]) - Initial/Assessment Note    Patient Details  Name: Julie Mcguire MRN: [id number] Date of Birth: 1956-01-17  Transition of Care [facility]) CM/SW Contact:    Golda Acre, RN Phone Number: 01/21/2020, 9:26 AM  Clinical Narrative:                  64 y.o.femalewith medical history significant ofrecent Covid pneumonia, hypertension, moderate obesity who presented to the emergency room with complaints of worsening shortness of breath.   Recently discharged from the hospital on 01/10/2020 following treatment for Covid-19 viral pneumonia with steroids, remdesivir, and baricitinib. She was discharged on 4 L of oxygen per minute. On EMS arrival, she was hypoxic and was saturating 75% on 6 L of nasal cannula. Saturation good to 85% with 15 L nonrebreather mask.Patient was then brought to the emergency department. As expected, patient has not been vaccinated. She she also mentioned that her husband has Covid at home but notsick as her.She says she is feeling very weak, has some stomach upset but denies having any nausea, vomiting, diarrhea, headache or dysuria.  In the ED, she was tachypneic, tachycardic,hypertensiveon arrival. Lab work showed elevated inflammatory markers with CRP in the range of 12. Chest x-ray showed moderate to marked severe bilateral patchy infiltrates. Patient admitted for the management of severe refractory Covid pneumonia.  hfnc at 90% desats down to 878%,iv zithro max and iv rocephin, Crp and D.dimer are elevated wbc 20.8, Following for progression and toc needs. Expected Discharge Plan: Home/Self Care Barriers to Discharge: Continued Medical Work up   Patient Goals and CMS Choice Patient states their goals for this hospitalization and ongoing recovery are:: to go home CMS Medicare.gov Compare Post Acute Care list provided to:: Patient    Expected Discharge Plan and Services Expected Discharge Plan: Home/Self Care    Discharge Planning Services: CM Consult   Living arrangements for the past 2 months: Single Family Home                                      Prior Living Arrangements/Services Living arrangements for the past 2 months: Single Family Home Lives with:: Spouse Patient language and need for interpreter reviewed:: Yes Do you feel safe going back to the place where you live?: Yes      Need for Family Participation in Patient Care: Yes (Comment) Care giver support system in place?: Yes (comment)   Criminal Activity/Legal Involvement Pertinent to Current Situation/Hospitalization: No - Comment as needed  Activities of Daily Living Home Assistive Devices/Equipment: Eyeglasses, Oxygen ADL Screening (condition at time of admission) Patient's cognitive ability adequate to safely complete daily activities?: Yes Is the patient deaf or have difficulty hearing?: No Does the patient have difficulty seeing, even when wearing glasses/contacts?: No Does the patient have difficulty concentrating, remembering, or making decisions?: No Patient able to express need for assistance with ADLs?: Yes Does the patient have difficulty dressing or bathing?: Yes Independently performs ADLs?: No Communication: Independent Dressing (OT): Needs assistance Is this a change from baseline?: Change from baseline, expected to last >3 days Grooming: Independent Feeding: Independent Bathing: Needs assistance Is this a change from baseline?: Change from baseline, expected to last >3 days Toileting: Needs assistance Is this a change from baseline?: Change from baseline, expected to last >3days In/Out Bed: Needs assistance Is this a change from baseline?: Change from baseline, expected to last >3 days  Walks in Home: Needs assistance Is this a change from baseline?: Change from baseline, expected to last >3 days Does the patient have difficulty walking or climbing stairs?: Yes Weakness of Legs: Both Weakness of  Arms/Hands: Both  Permission Sought/Granted                  Emotional Assessment Appearance:: Appears stated age Attitude/Demeanor/Rapport: Engaged Affect (typically observed): Calm Orientation: : Oriented to Place, Oriented to Self, Oriented to  Time, Oriented to Situation Alcohol / Substance Use: Not Applicable Psych Involvement: No (comment)  Admission diagnosis:  Respiratory distress [R06.03] SIRS (systemic inflammatory response syndrome) (HCC) [R65.10] Acute respiratory failure with hypoxia (HCC) [J96.01] COVID-19 [U07.1] Patient Active Problem List   Diagnosis Date Noted  . Acute respiratory failure with hypoxia (HCC) 01/18/2020  . Acute on chronic respiratory failure with hypoxia (HCC) 01/07/2020  . Pneumonia due to COVID-19 virus 01/07/2020  . Essential hypertension 01/07/2020  . ARF (acute renal failure) (HCC) 01/07/2020  . Obesity, Class III, BMI 40-49.9 (morbid obesity) (HCC) 01/07/2020  . Hyponatremia 01/07/2020  . Unilateral primary osteoarthritis, left knee 05/30/2018  . Unilateral primary osteoarthritis, right knee 05/30/2018  . 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 Unilateral primary osteoarthritis, left hip 08/30/2017   PCP:  Elias Else, MD Pharmacy:   [facility] [address] [place] - [address] [place] [address] Phone: [phone] Fax: [phone]     Social Determinants of Health (SDOH) Interventions    Readmission Risk Interventions No flowsheet data found.

## 2020-01-21 NOTE — Progress Notes (Signed)
PT out of bed - unable to do CPT via bed. Encouraged to deep breath and cough.

## 2020-01-21 NOTE — Progress Notes (Addendum)
PROGRESS NOTE    Julie Mcguire  ZWC:[id number] DOB: 06/18/1955 DOA: 07-Feb-2020 PCP: Julie Else, MD    Brief Narrative:  Julie Mcguire is a 64 y.o. female with medical history significant of recent Covid pneumonia, hypertension, moderate obesity who presented to the emergency room with complaints of worsening shortness of breath.   Recently discharged from the hospital on 01/10/2020 following treatment for Covid-19 viral pneumonia with steroids, remdesivir, and baricitinib.  She was discharged on 4 L of oxygen per minute. On EMS arrival, she was hypoxic and was saturating 75% on 6 L of nasal cannula.  Saturation good to 85% with 15 L nonrebreather mask.  Patient was then brought to the emergency department.  As expected, patient has not been vaccinated. She she also mentioned that her husband has Covid at home but not sick as her.  She says she is feeling very weak, has some stomach upset but denies having any nausea, vomiting, diarrhea, headache or dysuria.  In the ED, she was tachypneic, tachycardic, hypertensive on arrival.    Lab work showed elevated inflammatory markers with CRP in the range of 12. Chest x-ray showed moderate to marked severe bilateral patchy infiltrates. Patient admitted for the management of severe refractory Covid pneumonia.   Assessment & Plan:   Principal Problem:   Acute respiratory failure with hypoxia (HCC) Active Problems:   Pneumonia due to COVID-19 virus   Essential hypertension   Obesity, Class III, BMI 40-49.9 (morbid obesity) (HCC)   Acute hypoxic respiratory failure secondary to acute Covid-19 viral pneumonia during the ongoing 2020/2021 Covid 19 Pandemic - POA Patient representing to the ED following recent discharge 4 days prior for progressive shortness of breath despite treatment for Covid-19 with remdesivir, baricitinib and steroids.  Was discharged home on 4 L nasal cannula, notably hypoxic requiring 15 L NRB upon ED presentation.  Chest  x-ray with moderate to marked severity bilateral patchy multifocal infiltrates.  Procalcitonin less than 0.10. --COVID test: PCR + 01/07/2020 --CRP 2.2>12.5>8.5>3.0>1.1>0.9>2.0>3.5 --ddimer 1.06>1.72>3.77>3.27>3.30>5.76>2.68>2.62 --Completed 5-day course of remdesivir on 9/4 --Baricitinib 4mg  PO daily (Day #8/14) --Solumedrol 60 mg IV every 6 hours --prone for 2-3hrs every 12hrs if able --Continue supplemental oxygen, titrate to maintain SPO2 greater than 92%, currently on 50L HFNC and 15L NRB SPO2 93% at rest --BiPAP prn --Continue supportive care with albuterol MDI prn, vitamin C, zinc, Tylenol, antitussives (benzonatate/ Mucinex/Tussionex) --Incentive spirometry, flutter valve --Follow CBC, CMP, D-dimer, ferritin, and CRP daily --Continue airborne/contact isolation precautions for 3 weeks from the day of diagnosis --Palliative care following for assistance with goals of care and medical decision making; prognosis guarded but would accept patient/ventilatory support if needed for short period of time.  The treatment plan and use of medications and known side effects were discussed with patient/family. Some of the medications used are based on case reports/anecdotal data.  All other medications being used in the management of COVID-19 based on limited study data.  Complete risks and long-term side effects are unknown, however in the best clinical judgment they seem to be of some benefit.  Patient wanted to proceed with treatment options provided.  Secondary bacterial pneumonia Patient's WBC count up trending 9.2, 19.3, 24.4 over the past 3 days.  Repeat chest x-ray this morning shows bilateral airspace infiltration with increasing consolidation and air bronchograms on the left, all signs concerning for now development of a secondary pneumonia from her underlying initial Covid-19 viral pneumonia.  Urine strep pneumo antigen negative. --Urine Legionella pending --Sputum culture:  Pending --azithromycin 500 mg daily x5 days --ceftriaxone 2 g IV every 24 hours x7 days  Elevated D-dimer --ddimer 1.06>1.72>3.77>3.27>3.30>5.76>2.68>2.62 --Vascular duplex venous ultrasound bilateral lower extremities negative for DVT --CT angiogram chest: Negative for PE  Anxiety: Ativan 0.5 mg every 6 hours as needed  Essential hypertension --Continue amlodipine 10 mg p.o. daily --Continue metoprolol succinate 100 mg p.o. daily  Morbid obesity BMI 45.03 kg/m.  Discussed with patient need for aggressive lifestyle changes, weight loss as this complicates all facets of care.   DVT prophylaxis: Lovenox Code Status: Full code Family Communication: Updated patient extensively at bedside, updated patient spouse, Julie Mcguire via telephone this morning  Disposition Plan:  Status is: Inpatient  Remains inpatient appropriate because:Ongoing diagnostic testing needed not appropriate for outpatient work up, Unsafe d/c plan, IV treatments appropriate due to intensity of illness or inability to take PO and Inpatient level of care appropriate due to severity of illness continues on high FiO2 requirements, 50 L HFNC/NRB, prognosis guarded; palliative care following   Dispo: The patient is from: Home              Anticipated d/c is to: To be determined              Anticipated d/c date is: > 3 days              Patient currently is not medically stable to d/c.   Consultants:   Palliative care  Procedures:   None  Antimicrobials:   Remdesivir 8/31 - 9/4  Baricitinib 8/31>>  Azithromycin 9/6>>  Ceftriaxone 9/6>>   Subjective: Patient seen and examined bedside, resting comfortably on high flow nasal cannula/NRB.  Continues with global weakness/fatigue.  States breathing is slightly improved since yesterday.  Has been titrated down on supplemental oxygen from 65 L to 50 L HFNC this am.  No other complaints or concerns at this time.  Denies headache, no dizziness, no fever/chills/night  sweats, no nausea/vomiting/diarrhea, no chest pain, no palpitations, no abdominal pain. No acute events overnight per nursing staff.  Objective: Vitals:   01/21/20 0100 01/21/20 0355 01/21/20 0438 01/21/20 0732  BP: (!) 145/62     Pulse: 79  (!) 102   Resp: (!) 24  (!) 22   Temp:  98.6 F (37 C)  99 F (37.2 C)  TempSrc:  Axillary  Axillary  SpO2: 98%  96% 98%  Weight:      Height:        Intake/Output Summary (Last 24 hours) at 01/21/2020 1059 Last data filed at 01/20/2020 1700 Gross per 24 hour  Intake 350 ml  Output 275 ml  Net 75 ml   Filed Weights   01/15/20 1916 01/19/20 0500 01/20/20 0441  Weight: 119 kg 115.8 kg 114.3 kg    Examination:  General exam: Appears calm and comfortable Respiratory system: Decreased breath sounds bilaterally with rhonchi, normal respiratory effort, no accessory muscle use, on 50L HFNC and 15L NRB Cardiovascular system: S1 & S2 heard, RRR. No JVD, murmurs, rubs, gallops or clicks. No pedal edema. Gastrointestinal system: Abdomen is nondistended, soft and nontender. No organomegaly or masses felt. Normal bowel sounds heard. Central nervous system: Alert and oriented. No focal neurological deficits. Extremities: Symmetric 5 x 5 power. Skin: No rashes, lesions or ulcers Psychiatry: Judgement and insight appear normal.  Depressed mood, flat affect    Data Reviewed: I have personally reviewed following labs and imaging studies  CBC: Recent Labs  Lab 01/15/20 0536 01/15/20 0536 01/16/20 0234 01/16/20  0234 01/17/20 0431 01/18/20 0448 01/19/20 0554 01/20/20 0957 01/21/20 0232  WBC 10.7*   < > 13.8*   < > 15.0* 9.2 19.3* 24.4* 20.8*  NEUTROABS 9.2*  --  11.4*  --  12.7* 8.1* 17.2*  --   --   HGB 12.6   < > 12.9   < > 12.9 12.2 13.1 13.2 12.8  HCT 39.7   < > 39.9   < > 41.0 37.4 40.6 41.2 40.3  MCV 93.9   < > 92.8   < > 95.6 91.9 93.3 94.5 96.2  PLT 447*   < > 392   < > 321 243 278 202 218   < > = values in this interval not displayed.    Basic Metabolic Panel: Recent Labs  Lab 01/15/20 0536 01/15/20 0536 01/16/20 0234 01/16/20 0234 01/17/20 0431 01/18/20 0448 01/19/20 0554 01/20/20 0957 01/21/20 0232  NA 138   < > 141   < > 144 144 145 148* 149*  K 4.3   < > 3.8   < > 3.7 3.7 3.5 3.9 4.0  CL 101   < > 104   < > 103 100 98 99 102  CO2 25   < > 27   < > 29 34* 34* 38* 35*  GLUCOSE 158*   < > 173*   < > 163* 158* 155* 167* 195*  BUN 18   < > 25*   < > 41* 64* 75* 62* 52*  CREATININE 0.73   < > 0.83   < > 1.06* 1.40* 1.33* 1.12* 1.07*  CALCIUM 8.6*   < > 8.7*   < > 8.6* 8.6* 8.9 9.0 8.6*  MG 2.2   < > 2.3   < > 2.4 2.3 2.5* 2.9* 2.8*  PHOS 3.9  --  3.6  --  4.4 5.0* 4.6  --   --    < > = values in this interval not displayed.   GFR: Estimated Creatinine Clearance: 68.4 mL/min (A) (by C-G formula based on SCr of 1.07 mg/dL (H)). Liver Function Tests: Recent Labs  Lab 01/17/20 0431 01/18/20 0448 01/19/20 0554 01/20/20 0957 01/21/20 0232  AST 18 16 16 16 18   ALT 34 28 27 25 23   ALKPHOS 57 51 54 52 47  BILITOT 0.5 0.5 0.6 0.7 0.9  PROT 6.1* 5.6* 6.1* 6.5 6.2*  ALBUMIN 2.8* 2.7* 3.0* 3.1* 2.8*   No results for input(s): LIPASE, AMYLASE in the last 168 hours. No results for input(s): AMMONIA in the last 168 hours. Coagulation Profile: No results for input(s): INR, PROTIME in the last 168 hours. Cardiac Enzymes: No results for input(s): CKTOTAL, CKMB, CKMBINDEX, TROPONINI in the last 168 hours. BNP (last 3 results) No results for input(s): PROBNP in the last 8760 hours. HbA1C: No results for input(s): HGBA1C in the last 72 hours. CBG: Recent Labs  Lab 01/20/20 0833 01/20/20 1215 01/20/20 1742 01/20/20 2059 01/21/20 0751  GLUCAP 159* 171* 125* 144* 168*   Lipid Profile: No results for input(s): CHOL, HDL, LDLCALC, TRIG, CHOLHDL, LDLDIRECT in the last 72 hours. Thyroid Function Tests: No results for input(s): TSH, T4TOTAL, FREET4, T3FREE, THYROIDAB in the last 72 hours. Anemia Panel: No  results for input(s): VITAMINB12, FOLATE, FERRITIN, TIBC, IRON, RETICCTPCT in the last 72 hours. Sepsis Labs: Recent Labs  Lab 03/26/2020 1554 01/20/20 0957 01/21/20 0232  PROCALCITON <0.10 <0.10 <0.10  LATICACIDVEN 1.8  --   --     Recent Results (from the  past 240 hour(s))  Blood Culture (routine x 2)     Status: None   Collection Time: 12/19/2019  3:54 PM   Specimen: BLOOD  Result Value Ref Range Status   Specimen Description   Final    BLOOD RIGHT ANTECUBITAL Performed at [facility], [address], [place], [place] [address]    Special Requests   Final    BOTTLES DRAWN AEROBIC AND ANAEROBIC Blood Culture results may not be optimal due to an inadequate volume of blood received in culture bottles Performed at [facility], [address] [place], [place] [address]    Culture   Final    NO GROWTH 5 DAYS Performed at [facility], [address] [place], [place] [address]    Report Status 01/19/2020 FINAL  Final  Blood Culture (routine x 2)     Status: Abnormal   Collection Time: 01/09/2020  4:00 PM   Specimen: BLOOD LEFT HAND  Result Value Ref Range Status   Specimen Description   Final    BLOOD LEFT HAND Performed at [facility], [address] [place], [place] [address]    Special Requests   Final    BOTTLES DRAWN AEROBIC AND ANAEROBIC Blood Culture adequate volume Performed at [facility], [address], [place], [place] [address]    Culture  Setup Time   Final    GRAM POSITIVE COCCI IN CLUSTERS ANAEROBIC BOTTLE ONLY Organism ID to follow CRITICAL RESULT CALLED TO, READ BACK BY AND VERIFIED WITH: Damaris Hippo [facility] 01/15/20 2154 JDW    Culture (A)  Final    STAPHYLOCOCCUS EPIDERMIDIS THE SIGNIFICANCE OF ISOLATING THIS ORGANISM FROM A SINGLE SET OF BLOOD CULTURES WHEN MULTIPLE SETS ARE DRAWN IS UNCERTAIN. PLEASE NOTIFY THE MICROBIOLOGY DEPARTMENT WITHIN ONE WEEK IF SPECIATION AND  SENSITIVITIES ARE REQUIRED. Performed at [facility], [address], [place], [place] [address]    Report Status 01/17/2020 FINAL  Final  Blood Culture ID Panel (Reflexed)     Status: Abnormal   Collection Time: 01/11/2020  4:00 PM  Result Value Ref Range Status   Enterococcus faecalis NOT DETECTED NOT DETECTED Final   Enterococcus Faecium NOT DETECTED NOT DETECTED Final   Listeria monocytogenes NOT DETECTED NOT DETECTED Final   Staphylococcus species DETECTED (A) NOT DETECTED Final    Comment: CRITICAL RESULT CALLED TO, READ BACK BY AND VERIFIED WITH: Damaris Hippo [facility] 01/15/20 2154 JDW    Staphylococcus aureus (BCID) NOT DETECTED NOT DETECTED Final   Staphylococcus epidermidis DETECTED (A) NOT DETECTED Final    Comment: CRITICAL RESULT CALLED TO, READ BACK BY AND VERIFIED WITH: Damaris Hippo [facility] 01/15/20 2154 JDW    Staphylococcus lugdunensis NOT DETECTED NOT DETECTED Final   Streptococcus species NOT DETECTED NOT DETECTED Final   Streptococcus agalactiae NOT DETECTED NOT DETECTED Final   Streptococcus pneumoniae NOT DETECTED NOT DETECTED Final   Streptococcus pyogenes NOT DETECTED NOT DETECTED Final   A.calcoaceticus-baumannii NOT DETECTED NOT DETECTED Final   Bacteroides fragilis NOT DETECTED NOT DETECTED Final   Enterobacterales NOT DETECTED NOT DETECTED Final   Enterobacter cloacae complex NOT DETECTED NOT DETECTED Final   Escherichia coli NOT DETECTED NOT DETECTED Final   Klebsiella aerogenes NOT DETECTED NOT DETECTED Final   Klebsiella oxytoca NOT DETECTED NOT DETECTED Final   Klebsiella pneumoniae NOT DETECTED NOT DETECTED Final   Proteus species NOT DETECTED NOT DETECTED Final   Salmonella species NOT DETECTED NOT DETECTED Final  Serratia marcescens NOT DETECTED NOT DETECTED Final   Haemophilus influenzae NOT DETECTED NOT DETECTED Final   Neisseria meningitidis NOT DETECTED NOT DETECTED Final   Pseudomonas aeruginosa NOT DETECTED NOT DETECTED Final    Stenotrophomonas maltophilia NOT DETECTED NOT DETECTED Final   Candida albicans NOT DETECTED NOT DETECTED Final   Candida auris NOT DETECTED NOT DETECTED Final   Candida glabrata NOT DETECTED NOT DETECTED Final   Candida krusei NOT DETECTED NOT DETECTED Final   Candida parapsilosis NOT DETECTED NOT DETECTED Final   Candida tropicalis NOT DETECTED NOT DETECTED Final   Cryptococcus neoformans/gattii NOT DETECTED NOT DETECTED Final   Methicillin resistance mecA/C NOT DETECTED NOT DETECTED Final    Comment: Performed at [facility], [address], [place], [place] [address]  Culture, sputum-assessment     Status: None   Collection Time: 01/20/20 11:45 AM   Specimen: Expectorated Sputum  Result Value Ref Range Status   Specimen Description Expect. Sput  Final   Special Requests NONE  Final   Sputum evaluation   Final    THIS SPECIMEN IS ACCEPTABLE FOR SPUTUM CULTURE Performed at [facility], [address], [place], [place] [address]    Report Status 01/20/2020 FINAL  Final  Culture, respiratory     Status: None (Preliminary result)   Collection Time: 01/20/20 11:45 AM  Result Value Ref Range Status   Specimen Description   Final    Expect. Sput Performed at [facility], [address], [place], [place] [address]    Special Requests   Final    NONE Reflexed from [id number] Performed at [facility], [address] [place], [place] [address]    Gram Stain   Final    FEW WBC PRESENT,BOTH PMN AND MONONUCLEAR ABUNDANT GRAM POSITIVE COCCI RARE GRAM NEGATIVE RODS RARE YEAST    Culture   Final    CULTURE REINCUBATED FOR BETTER GROWTH Performed at [facility], [address], [place], [place] [address]    Report Status PENDING  Incomplete  MRSA PCR Screening     Status: None   Collection Time: 01/20/20  5:09 PM   Specimen: Nasopharyngeal  Result Value Ref Range Status   MRSA by PCR NEGATIVE NEGATIVE Final     Comment:        The GeneXpert MRSA Assay (FDA approved for NASAL specimens only), is one component of a comprehensive MRSA colonization surveillance program. It is not intended to diagnose MRSA infection nor to guide or monitor treatment for MRSA infections. Performed at [facility], [address], [place], [place] [address]          Radiology Studies: DG CHEST PORT 1 VIEW  Result Date: 01/21/2020 CLINICAL DATA:  Shortness of breath.  COVID. EXAM: PORTABLE CHEST 1 VIEW COMPARISON:  01/20/2020. FINDINGS: Low lung volumes. Diffuse progressive dense bilateral pulmonary infiltrates. Heart size stable. Left pleural effusion cannot be excluded. No pneumothorax. Degenerative changes scoliosis thoracic spine. IMPRESSION: Low lung volumes. Diffuse progressive dense bilateral pulmonary infiltrates. Left pleural effusion cannot be excluded. Electronically Signed   By: Maisie Fus  Register   On: 01/21/2020 05:36   DG CHEST PORT 1 VIEW  Result Date: 01/20/2020 CLINICAL DATA:  Dyspnea EXAM: PORTABLE CHEST 1 VIEW COMPARISON:  2020/02/13 FINDINGS: Shallow inspiration. Bilateral airspace infiltration with increasing consolidation and air bronchograms on the left since previous study. No pleural effusions. No pneumothorax. Heart size is obscured. IMPRESSION: Bilateral airspace infiltration with increasing  consolidation and air bronchograms on the left. Electronically Signed   By: Burman Nieves M.D.   On: 01/20/2020 05:00        Scheduled Meds: . albuterol  2 puff Inhalation Q6H  . amLODipine  10 mg Oral Daily  . vitamin C  500 mg Oral Daily  . baricitinib  2 mg Oral Daily  . Chlorhexidine Gluconate Cloth  6 each Topical Daily  . enoxaparin (LOVENOX) injection  60 mg Subcutaneous Q24H  . insulin aspart  0-9 Units Subcutaneous TID WC  . methylPREDNISolone (SOLU-MEDROL) injection  60 mg Intravenous Q6H  . metoprolol succinate  100 mg Oral Daily  . oxybutynin  5 mg Oral BID  .  zinc sulfate  220 mg Oral Daily   Continuous Infusions: . azithromycin Stopped (01/20/20 1416)  . cefTRIAXone (ROCEPHIN)  IV Stopped (01/20/20 1306)     LOS: 7 days    Time spent: 42 minutes spent on chart review, discussion with nursing staff, consultants, updating family and interview/physical exam; more than 50% of that time was spent in counseling and/or coordination of care.    Alvira Philips [place], DO [facility] Available via Epic secure chat 7am-7pm After these hours, please refer to coverage provider listed on [email or web address] 01/21/2020, 10:59 AM

## 2020-01-22 LAB — CBC
HCT: 39.8 % (ref 36.0–46.0)
Hemoglobin: 12.3 g/dL (ref 12.0–15.0)
MCH: 29.9 pg (ref 26.0–34.0)
MCHC: 30.9 g/dL (ref 30.0–36.0)
MCV: 96.8 fL (ref 80.0–100.0)
Platelets: 241 10*3/uL (ref 150–400)
RBC: 4.11 MIL/uL (ref 3.87–5.11)
RDW: 13.9 % (ref 11.5–15.5)
WBC: 14.5 10*3/uL — ABNORMAL HIGH (ref 4.0–10.5)
nRBC: 0 % (ref 0.0–0.2)

## 2020-01-22 LAB — PROCALCITONIN: Procalcitonin: <0.1 ng/mL

## 2020-01-22 LAB — GLUCOSE, CAPILLARY
Glucose-Capillary: 185 mg/dL — ABNORMAL HIGH (ref 70–99)
Glucose-Capillary: 157 mg/dL — ABNORMAL HIGH (ref 70–99)
Glucose-Capillary: 159 mg/dL — ABNORMAL HIGH (ref 70–99)
Glucose-Capillary: 183 mg/dL — ABNORMAL HIGH (ref 70–99)

## 2020-01-22 LAB — LEGIONELLA PNEUMOPHILA SEROGP 1 UR AG: L. pneumophila Serogp 1 Ur Ag: NEGATIVE

## 2020-01-22 LAB — COMPREHENSIVE METABOLIC PANEL
ALT: 21 U/L (ref 0–44)
AST: 19 U/L (ref 15–41)
Albumin: 2.7 g/dL — ABNORMAL LOW (ref 3.5–5.0)
Alkaline Phosphatase: 47 U/L (ref 38–126)
Anion gap: 15 (ref 5–15)
BUN: 55 mg/dL — ABNORMAL HIGH (ref 8–23)
CO2: 36 mmol/L — ABNORMAL HIGH (ref 22–32)
Calcium: 9.3 mg/dL (ref 8.9–10.3)
Chloride: 106 mmol/L (ref 98–111)
Creatinine, Ser: 1.03 mg/dL — ABNORMAL HIGH (ref 0.44–1.00)
GFR calc Af Amer: >60 mL/min (ref >60)
GFR calc non Af Amer: 59 mL/min — ABNORMAL LOW (ref >60)
Glucose, Bld: 186 mg/dL — ABNORMAL HIGH (ref 70–99)
Potassium: 4.3 mmol/L (ref 3.5–5.1)
Sodium: 157 mmol/L — ABNORMAL HIGH (ref 135–145)
Total Bilirubin: 0.9 mg/dL (ref 0.3–1.2)
Total Protein: 5.9 g/dL — ABNORMAL LOW (ref 6.5–8.1)

## 2020-01-22 LAB — CULTURE, RESPIRATORY W GRAM STAIN: Culture: NORMAL

## 2020-01-22 LAB — C-REACTIVE PROTEIN: CRP: 1.9 mg/dL — ABNORMAL HIGH (ref <1.0)

## 2020-01-22 LAB — D-DIMER, QUANTITATIVE: D-Dimer, Quant: 2.36 ug/mL-FEU — ABNORMAL HIGH (ref 0.00–0.50)

## 2020-01-22 LAB — MAGNESIUM: Magnesium: 3 mg/dL — ABNORMAL HIGH (ref 1.7–2.4)

## 2020-01-22 MED ORDER — LIP MEDEX EX OINT
TOPICAL_OINTMENT | CUTANEOUS | Status: DC | PRN
  Filled 2020-01-22: qty 7

## 2020-01-22 MED ORDER — QUETIAPINE FUMARATE 50 MG PO TABS
25.0000 mg | ORAL_TABLET | Freq: Every day | ORAL | Status: DC
  Administered 2020-01-22: 25 mg via ORAL
  Filled 2020-01-22: qty 1

## 2020-01-22 MED ORDER — LORAZEPAM 2 MG/ML IJ SOLN
1.0000 mg | INTRAMUSCULAR | Status: DC | PRN
  Administered 2020-01-22 – 2020-01-26 (×15): 2 mg via INTRAVENOUS
  Filled 2020-01-22 (×18): qty 1

## 2020-01-22 MED ORDER — ORAL CARE MOUTH RINSE
15.0000 mL | Freq: Two times a day (BID) | OROMUCOSAL | Status: DC
  Administered 2020-01-22 – 2020-02-08 (×24): 15 mL via OROMUCOSAL

## 2020-01-22 MED ORDER — METHYLPREDNISOLONE SODIUM SUCC 125 MG IJ SOLR
60.0000 mg | Freq: Two times a day (BID) | INTRAMUSCULAR | Status: DC
  Administered 2020-01-22 – 2020-01-23 (×2): 60 mg via INTRAVENOUS
  Filled 2020-01-22 (×2): qty 2

## 2020-01-22 MED ORDER — DEXTROSE 5 % IV SOLN: INTRAVENOUS | Status: DC

## 2020-01-22 MED ORDER — BARICITINIB 2 MG PO TABS
4.0000 mg | ORAL_TABLET | Freq: Every day | ORAL | Status: AC
  Filled 2020-01-22: qty 2

## 2020-01-22 MED ORDER — LORAZEPAM 2 MG/ML IJ SOLN
1.0000 mg | INTRAMUSCULAR | Status: DC | PRN
  Administered 2020-01-22: 2 mg via INTRAVENOUS
  Filled 2020-01-22 (×2): qty 1

## 2020-01-22 MED ORDER — LORAZEPAM 2 MG/ML IJ SOLN
1.0000 mg | Freq: Once | INTRAMUSCULAR | Status: AC
  Administered 2020-01-22: 1 mg via INTRAVENOUS

## 2020-01-22 MED ORDER — HALOPERIDOL LACTATE 5 MG/ML IJ SOLN
5.0000 mg | Freq: Four times a day (QID) | INTRAMUSCULAR | Status: DC | PRN
  Administered 2020-01-22: 5 mg via INTRAVENOUS
  Filled 2020-01-22: qty 1

## 2020-01-22 MED ORDER — ALBUTEROL SULFATE HFA 108 (90 BASE) MCG/ACT IN AERS: 2.0000 | INHALATION_SPRAY | RESPIRATORY_TRACT | Status: DC | PRN

## 2020-01-22 MED ORDER — DICLOFENAC SODIUM 1 % EX GEL
2.0000 g | Freq: Four times a day (QID) | CUTANEOUS | Status: DC | PRN
  Administered 2020-01-22 – 2020-02-02 (×5): 2 g via TOPICAL
  Filled 2020-01-22: qty 100

## 2020-01-22 NOTE — Progress Notes (Signed)
Patient again noted to be agitated and removing devices. Administered PRN Ativan and then IV Haldol. After haldol administration patient became more agitated. MD notified, Haldol d/c'd.

## 2020-01-22 NOTE — Progress Notes (Signed)
Pt is finally asleep and resting well per RN, cpt held at this time.

## 2020-01-22 NOTE — Progress Notes (Signed)
Julie Mcguire  WER:[id number] DOB: 1956/01/30 DOA: 12/17/2019 PCP: Elias Else, MD    Brief Narrative:  64 year old with a history of recent COVID pneumonia, HTN, and obesity who returned to the ED with worsening shortness of breath.  She was discharged from the hospital 8/27 following treatment for Covid pneumonia with steroids, remdesivir, and baricitinib.  At the time of discharge she required 4 L oxygen support.  When EMS arrived at her home she was found to be saturating 75% on 6 L of nasal cannula and required 15 L via nonrebreather mask to get saturations up to 85%.  In the ED CXR noted moderate to severe bilateral patchy infiltrates and the patient was readmitted to the hospital with refractory Covid pneumonia.  Significant Events:  8/24 initial admission to hospital 8/27 discharge home after treatment for Covid pneumonia 8/31 readmit for refractory Covid pneumonia  Date of Positive COVID Test:  01/07/2020  Vaccination Status: NOT vaccinated   COVID-19 specific Treatment: Solu-Medrol 8/24 > 8/27 + 8/31 > Baricitinib 8/24 > 8/27 + 8/31 > Remdesivir 8/24 > 8/27 + 8/31 >  Antimicrobials:  Azithromycin 9/6 > Rocephin 9/6 >  DVT prophylaxis: Lovenox  Subjective: Vital signs are stable with blood pressure reasonably controlled.  Oxygen saturations of 82-95% on high flow nasal cannula at 70% and 30 L.  No significant downward titration of oxygen support over last 24 hours. Severe agitation has proven to be an issue over the last 12 hours with the patient frequently removing her oxygen support and rapidly and severely desaturating. Attempt to use Haldol seemed to actually paradoxically worsen her agitation. Thus far she appears to be responding fairly well to Ativan.  Assessment & Plan:  Acute hypoxic respiratory failure -COVID Pneumonia Has completed a 5-day course of remdesivir -continue baricitinib - Solu-Medrol continues - prone frequently -titrate oxygen as required  -utilizing BiPAP as needed in ICU setting  Recent Labs  Lab 01/18/20 0448 01/19/20 0554 01/20/20 0957 01/21/20 0232 01/22/20 0328  DDIMER 3.30* 5.76* 2.68* 2.62* 2.36*  CRP 1.1* 0.9 2.0* 3.5* 1.9*  ALT 28 27 25 23 21   PROCALCITON  --   --  <0.10 <0.10 <0.10    Possible secondary bacterial pneumonia empiric antibiotic coverage has been prescribed -procalcitonin not convincing of bacterial pneumonia -discontinue antibiotics after 3 complete days of treatment  Elevated D-dimer Venous duplex negative for DVT - CTa chest negative for PE  Hypernatremia Increase free water via IV as confusion prohibits signif oral intake at present   Recent Labs  Lab 01/18/20 0448 01/19/20 0554 01/20/20 0957 01/21/20 0232 01/22/20 0328  NA 144 145 148* 149* 157*     Steroid-induced hyperglycemia No known history of DM - check A1c in a.m. - CBG not markedly elevated - wean steroid asap  Essential HTN Continue amlodipine and metoprolol  Morbid obesity - Body mass index is 43.25 kg/m.   Code Status: FULL CODE Family Communication:  Status is: Inpatient  Remains inpatient appropriate because:IV treatments appropriate due to intensity of illness or inability to take PO and Inpatient level of care appropriate due to severity of illness   Dispo:  Patient From: Home  Planned Disposition: To be determined  Expected discharge date: 01/25/20  Medically stable for discharge: No   Consultants:  Palliative Care  Objective: Blood pressure 133/77, pulse 92, temperature 97.8 F (36.6 C), temperature source Axillary, resp. rate (!) 41, height 5\' 4"  (1.626 m), weight 114.3 kg, SpO2 90 %.  Intake/Output Summary (Last 24  hours) at 01/22/2020 0713 Last data filed at 01/22/2020 0600 Gross per 24 hour  Intake 45 ml  Output 1175 ml  Net -1130 ml   Filed Weights   01/15/20 1916 01/19/20 0500 01/20/20 0441  Weight: 119 kg 115.8 kg 114.3 kg    Examination: General: Not in extremis but  agitated Lungs: Fine crackles diffusely Cardiovascular: Regular rate and rhythm without murmur gallop or rub normal S1 and S2 Abdomen: Nontender, nondistended, soft, bowel sounds positive, no rebound, no ascites, no appreciable mass Extremities: No significant cyanosis, clubbing, or edema bilateral lower extremities  CBC: Recent Labs  Lab 01/17/20 0431 01/17/20 0431 01/18/20 0448 01/18/20 0448 01/19/20 0554 01/19/20 0554 01/20/20 0957 01/21/20 0232 01/22/20 0328  WBC 15.0*   < > 9.2   < > 19.3*   < > 24.4* 20.8* 14.5*  NEUTROABS 12.7*  --  8.1*  --  17.2*  --   --   --   --   HGB 12.9   < > 12.2   < > 13.1   < > 13.2 12.8 12.3  HCT 41.0   < > 37.4   < > 40.6   < > 41.2 40.3 39.8  MCV 95.6   < > 91.9   < > 93.3   < > 94.5 96.2 96.8  PLT 321   < > 243   < > 278   < > 202 218 241   < > = values in this interval not displayed.   Basic Metabolic Panel: Recent Labs  Lab 01/17/20 0431 01/17/20 0431 01/18/20 0448 01/18/20 0448 01/19/20 0554 01/19/20 0554 01/20/20 0957 01/21/20 0232 01/22/20 0328  NA 144   < > 144   < > 145   < > 148* 149* 157*  K 3.7   < > 3.7   < > 3.5   < > 3.9 4.0 4.3  CL 103   < > 100   < > 98   < > 99 102 106  CO2 29   < > 34*   < > 34*   < > 38* 35* 36*  GLUCOSE 163*   < > 158*   < > 155*   < > 167* 195* 186*  BUN 41*   < > 64*   < > 75*   < > 62* 52* 55*  CREATININE 1.06*   < > 1.40*   < > 1.33*   < > 1.12* 1.07* 1.03*  CALCIUM 8.6*   < > 8.6*   < > 8.9   < > 9.0 8.6* 9.3  MG 2.4   < > 2.3   < > 2.5*   < > 2.9* 2.8* 3.0*  PHOS 4.4  --  5.0*  --  4.6  --   --   --   --    < > = values in this interval not displayed.   GFR: Estimated Creatinine Clearance: 71.1 mL/min (A) (by C-G formula based on SCr of 1.03 mg/dL (H)).  Liver Function Tests: Recent Labs  Lab 01/19/20 0554 01/20/20 0957 01/21/20 0232 01/22/20 0328  AST 16 16 18 19   ALT 27 25 23 21   ALKPHOS 54 52 47 47  BILITOT 0.6 0.7 0.9 0.9  PROT 6.1* 6.5 6.2* 5.9*  ALBUMIN 3.0* 3.1*  2.8* 2.7*   No results for input(s): LIPASE, AMYLASE in the last 168 hours. No results for input(s): AMMONIA in the last 168 hours.  Coagulation Profile: No results for input(s):  INR, PROTIME in the last 168 hours.  Cardiac Enzymes: No results for input(s): CKTOTAL, CKMB, CKMBINDEX, TROPONINI in the last 168 hours.  HbA1C: No results found for: HGBA1C  CBG: Recent Labs  Lab 01/20/20 2059 01/21/20 0751 01/21/20 1209 01/21/20 1726 01/21/20 2158  GLUCAP 144* 168* 174* 174* 165*    Recent Results (from the past 240 hour(s))  Blood Culture (routine x 2)     Status: None   Collection Time: 12/18/2019  3:54 PM   Specimen: BLOOD  Result Value Ref Range Status   Specimen Description   Final    BLOOD RIGHT ANTECUBITAL Performed at [facility], [address] [place], [place] [address]    Special Requests   Final    BOTTLES DRAWN AEROBIC AND ANAEROBIC Blood Culture results may not be optimal due to an inadequate volume of blood received in culture bottles Performed at [facility], [address], [place], [place] [address]    Culture   Final    NO GROWTH 5 DAYS Performed at [facility], [address], [place], [place] [address]    Report Status 01/19/2020 FINAL  Final  Blood Culture (routine x 2)     Status: Abnormal   Collection Time: 01/05/2020  4:00 PM   Specimen: BLOOD LEFT HAND  Result Value Ref Range Status   Specimen Description   Final    BLOOD LEFT HAND Performed at [facility], [address] [place], [place] [address]    Special Requests   Final    BOTTLES DRAWN AEROBIC AND ANAEROBIC Blood Culture adequate volume Performed at [facility], [address] [place], [place] [address]    Culture  Setup Time   Final    GRAM POSITIVE COCCI IN CLUSTERS ANAEROBIC BOTTLE ONLY Organism ID to follow CRITICAL RESULT CALLED TO, READ BACK BY AND VERIFIED WITH: Damaris Hippo [facility] 01/15/20  2154 JDW    Culture (A)  Final    STAPHYLOCOCCUS EPIDERMIDIS THE SIGNIFICANCE OF ISOLATING THIS ORGANISM FROM A SINGLE SET OF BLOOD CULTURES WHEN MULTIPLE SETS ARE DRAWN IS UNCERTAIN. PLEASE NOTIFY THE MICROBIOLOGY DEPARTMENT WITHIN ONE WEEK IF SPECIATION AND SENSITIVITIES ARE REQUIRED. Performed at [facility], [address], [place], [place] [address]    Report Status 01/17/2020 FINAL  Final  Blood Culture ID Panel (Reflexed)     Status: Abnormal   Collection Time: 12/23/2019  4:00 PM  Result Value Ref Range Status   Enterococcus faecalis NOT DETECTED NOT DETECTED Final   Enterococcus Faecium NOT DETECTED NOT DETECTED Final   Listeria monocytogenes NOT DETECTED NOT DETECTED Final   Staphylococcus species DETECTED (A) NOT DETECTED Final    Comment: CRITICAL RESULT CALLED TO, READ BACK BY AND VERIFIED WITH: Damaris Hippo [facility] 01/15/20 2154 JDW    Staphylococcus aureus (BCID) NOT DETECTED NOT DETECTED Final   Staphylococcus epidermidis DETECTED (A) NOT DETECTED Final    Comment: CRITICAL RESULT CALLED TO, READ BACK BY AND VERIFIED WITH: Damaris Hippo [facility] 01/15/20 2154 JDW    Staphylococcus lugdunensis NOT DETECTED NOT DETECTED Final   Streptococcus species NOT DETECTED NOT DETECTED Final   Streptococcus agalactiae NOT DETECTED NOT DETECTED Final   Streptococcus pneumoniae NOT DETECTED NOT DETECTED Final   Streptococcus pyogenes NOT DETECTED NOT DETECTED Final   A.calcoaceticus-baumannii NOT DETECTED NOT DETECTED Final   Bacteroides fragilis NOT DETECTED NOT DETECTED Final   Enterobacterales NOT DETECTED NOT DETECTED Final   Enterobacter cloacae  complex NOT DETECTED NOT DETECTED Final   Escherichia coli NOT DETECTED NOT DETECTED Final   Klebsiella aerogenes NOT DETECTED NOT DETECTED Final   Klebsiella oxytoca NOT DETECTED NOT DETECTED Final   Klebsiella pneumoniae NOT DETECTED NOT DETECTED Final   Proteus species NOT DETECTED NOT DETECTED Final   Salmonella species NOT DETECTED  NOT DETECTED Final   Serratia marcescens NOT DETECTED NOT DETECTED Final   Haemophilus influenzae NOT DETECTED NOT DETECTED Final   Neisseria meningitidis NOT DETECTED NOT DETECTED Final   Pseudomonas aeruginosa NOT DETECTED NOT DETECTED Final   Stenotrophomonas maltophilia NOT DETECTED NOT DETECTED Final   Candida albicans NOT DETECTED NOT DETECTED Final   Candida auris NOT DETECTED NOT DETECTED Final   Candida glabrata NOT DETECTED NOT DETECTED Final   Candida krusei NOT DETECTED NOT DETECTED Final   Candida parapsilosis NOT DETECTED NOT DETECTED Final   Candida tropicalis NOT DETECTED NOT DETECTED Final   Cryptococcus neoformans/gattii NOT DETECTED NOT DETECTED Final   Methicillin resistance mecA/C NOT DETECTED NOT DETECTED Final    Comment: Performed at [facility], [address] [place], [place] [address]  Culture, sputum-assessment     Status: None   Collection Time: 01/20/20 11:45 AM   Specimen: Expectorated Sputum  Result Value Ref Range Status   Specimen Description Expect. Sput  Final   Special Requests NONE  Final   Sputum evaluation   Final    THIS SPECIMEN IS ACCEPTABLE FOR SPUTUM CULTURE [facility], [address] [place], [place] [address]    Report Status 01/20/2020 FINAL  Final  Culture, respiratory     Status: None (Preliminary result)   Collection Time: 01/20/20 11:45 AM  Result Value Ref Range Status   Specimen Description   Final    Expect. Sput Performed at [facility], [address] [place], [place] [address]    Special Requests   Final    NONE Reflexed from [id number] Performed at [facility], [address] [place], [place] [address]    Gram Stain   Final    FEW WBC PRESENT,BOTH PMN AND MONONUCLEAR ABUNDANT GRAM POSITIVE COCCI RARE GRAM NEGATIVE RODS RARE YEAST    Culture   Final    CULTURE REINCUBATED FOR BETTER GROWTH Performed at [facility], [address] [place], [place] [address]    Report Status PENDING  Incomplete  MRSA PCR Screening     Status: None   Collection Time: 01/20/20  5:09 PM   Specimen: Nasopharyngeal  Result Value Ref Range Status   MRSA by PCR NEGATIVE NEGATIVE Final    Comment:        The GeneXpert MRSA Assay (FDA approved for NASAL specimens only), is one component of a comprehensive MRSA colonization surveillance program. It is not intended to diagnose MRSA infection nor to guide or monitor treatment for MRSA infections. Performed at [facility], [address] [place], [place] [address]      Scheduled Meds: . albuterol  2 puff Inhalation Q6H  . amLODipine  10 mg Oral Daily  . vitamin C  500 mg Oral Daily  . baricitinib  2 mg Oral Daily  . Chlorhexidine Gluconate Cloth  6 each Topical Daily  . enoxaparin (LOVENOX) injection  60 mg Subcutaneous Q24H  . insulin aspart  0-9 Units Subcutaneous TID WC  . methylPREDNISolone (SOLU-MEDROL) injection  60 mg Intravenous Q6H  . metoprolol succinate  100  mg Oral Daily  . oxybutynin  5 mg Oral BID  . zinc sulfate  220 mg Oral Daily   Continuous Infusions: . azithromycin Stopped (01/21/20 1324)  . cefTRIAXone (ROCEPHIN)  IV Stopped (01/21/20 1330)     LOS: 8 days   Lonia Blood, MD [facility] Office  [phone] Pager - Text Page per Amion  If 7PM-7AM, please contact night-coverage per Amion 01/22/2020, 7:13 AM

## 2020-01-22 NOTE — Progress Notes (Signed)
   Daily Progress Note   Patient Name: Julie Mcguire       Date: 01/22/2020 DOB: 1956-02-06  Age: 64 y.o. MRN#: [id number] Attending Physician: Lonia Blood, MD Primary Care Physician: Elias Else, MD Admit Date: 12/18/2019  Reason for Consultation/Follow-up: Establishing goals of care  Subjective: Patient observed sitting upright in bed. Remains on HFNC and NRB. Able to take oral medication as administered by RN without complications. Patient confused but alert to self. Continues to exhibit signs of agitation, wanting to get up out of bed and leave.   Updates provided via phone to Julie Mcguire (husband). He expressed he was remaining hopeful as it seem patient was doing somewhat better on yesterday when he facetime with her. He reports earlier this morning she talked with him by phone and knew who he was although he could tell she was short of breath. Julie Mcguire mentions speaking with after being called by nursing staff due to increased agitation and confusion. He reports at that time she continued to call him by name appropriately despite confusion. Husband expresses he and family worries of patient's limited improvement.   I discussed full code status and re-addressed code status with updates of guarded prognosis. Husband verbalized understanding of information however he wishes to continue to watchfully wait and take things day by day with hopes that patient will "turn around and improve". Confirms wishes for patient to remain a full code and receive full aggressive care.   I encouraged husband to continue ongoing discussions with family focusing on patient's quality of life and prognosis. He verbalized understanding.   All questions answered and support provided.   Length of Stay: 8 days  Vital Signs: BP 138/64 (BP Location: Left Arm)   Pulse 98   Temp 98.2 F (36.8 C) (Oral)   Resp 20   Ht 5\' 4"  (1.626 m)   Wt 114.3 kg   SpO2 93%   BMI 43.25 kg/m  SpO2: SpO2: 93 % O2 Device: O2  Device: High Flow Nasal Cannula, NRB O2 Flow Rate: O2 Flow Rate (L/min): 450 L/min  Physical Exam: -restless, ill appearing -HFNC, NRB -will follow some commands  The above conversation was completed via telephone due to the visitor restrictions during the COVID-19 pandemic. Thorough chart review and discussion with necessary members of the care team was completed as part of assessment. All issues were discussed and addressed but no detailed physical exam was performed.           Palliative Care Assessment & Plan    Code Status:  Full code  Goals of Care/Recommendations:  Full Code/Aggressive medical interventions per husband  Updates provided and extensive discussion with husband regarding patient's guarded-poor prognosis and minimal signs of improvement. Husband and family remaining hopeful. Watchful waiting.   PMT will continue to support and follow as needed. Please call team line with urgent needs.   Prognosis: Guarded-Poor   Discharge Planning: To Be Determined  Thank you for allowing the Palliative Medicine Team to assist in the care of this patient.  Time Total: 50 min.   Visit consisted of counseling and education dealing with the complex and emotionally intense issues of symptom management and palliative care in the setting of serious and potentially life-threatening illness.Greater than 50%  of this time was spent counseling and coordinating care related to the above assessment and plan.  , AGPCNP-BC  Palliative Medicine Team [phone]

## 2020-01-22 NOTE — Progress Notes (Signed)
This RN notified Sharon Seller, MD of patients increasing agitation and confusion. Patient has been reoriented several times, by several staff members. She continues to remove oxygen devices and yell out. Patient has been repositioned, voltaren gel applied to knees, heat packs. Husband called on speaker phone to try to reassure patient. MD orders pending.

## 2020-01-22 NOTE — Progress Notes (Signed)
Pt very agitated RT will hold CPT at this time. RT will continue to monitor.

## 2020-01-22 NOTE — Progress Notes (Signed)
OT Cancellation Note  Patient Details Name: Julie Mcguire MRN: [id number] DOB: 03-Aug-1955   Cancelled Treatment:    Reason Eval/Treat Not Completed: Other (comment). Hold today per RN. Patient currently being placed in restraints secondary to trying to crawl out of bed and pulling her mask off. Will continue to follow patient and treat when patient more appropriate.  Dalilah Curlin L Laysa Kimmey 01/22/2020, 4:14 PM

## 2020-01-23 LAB — COMPREHENSIVE METABOLIC PANEL
ALT: 28 U/L (ref 0–44)
AST: 22 U/L (ref 15–41)
Albumin: 2.7 g/dL — ABNORMAL LOW (ref 3.5–5.0)
Alkaline Phosphatase: 46 U/L (ref 38–126)
Anion gap: 9 (ref 5–15)
BUN: 55 mg/dL — ABNORMAL HIGH (ref 8–23)
CO2: 36 mmol/L — ABNORMAL HIGH (ref 22–32)
Calcium: 8.9 mg/dL (ref 8.9–10.3)
Chloride: 110 mmol/L (ref 98–111)
Creatinine, Ser: 0.99 mg/dL (ref 0.44–1.00)
GFR calc Af Amer: >60 mL/min (ref >60)
GFR calc non Af Amer: >60 mL/min (ref >60)
Glucose, Bld: 208 mg/dL — ABNORMAL HIGH (ref 70–99)
Potassium: 3.4 mmol/L — ABNORMAL LOW (ref 3.5–5.1)
Sodium: 155 mmol/L — ABNORMAL HIGH (ref 135–145)
Total Bilirubin: 0.7 mg/dL (ref 0.3–1.2)
Total Protein: 5.8 g/dL — ABNORMAL LOW (ref 6.5–8.1)

## 2020-01-23 LAB — HEMOGLOBIN A1C
Hgb A1c MFr Bld: 6.4 % — ABNORMAL HIGH (ref 4.8–5.6)
Mean Plasma Glucose: 136.98 mg/dL

## 2020-01-23 LAB — MAGNESIUM: Magnesium: 3 mg/dL — ABNORMAL HIGH (ref 1.7–2.4)

## 2020-01-23 LAB — GLUCOSE, CAPILLARY
Glucose-Capillary: 171 mg/dL — ABNORMAL HIGH (ref 70–99)
Glucose-Capillary: 149 mg/dL — ABNORMAL HIGH (ref 70–99)
Glucose-Capillary: 153 mg/dL — ABNORMAL HIGH (ref 70–99)
Glucose-Capillary: 149 mg/dL — ABNORMAL HIGH (ref 70–99)

## 2020-01-23 LAB — D-DIMER, QUANTITATIVE: D-Dimer, Quant: 2.56 ug/mL-FEU — ABNORMAL HIGH (ref 0.00–0.50)

## 2020-01-23 LAB — CBC
HCT: 39.3 % (ref 36.0–46.0)
Hemoglobin: 12.1 g/dL (ref 12.0–15.0)
MCH: 30.4 pg (ref 26.0–34.0)
MCHC: 30.8 g/dL (ref 30.0–36.0)
MCV: 98.7 fL (ref 80.0–100.0)
Platelets: 291 10*3/uL (ref 150–400)
RBC: 3.98 MIL/uL (ref 3.87–5.11)
RDW: 14.2 % (ref 11.5–15.5)
WBC: 13.2 10*3/uL — ABNORMAL HIGH (ref 4.0–10.5)
nRBC: 0 % (ref 0.0–0.2)

## 2020-01-23 LAB — C-REACTIVE PROTEIN: CRP: 0.8 mg/dL (ref <1.0)

## 2020-01-23 LAB — FERRITIN: Ferritin: 595 ng/mL — ABNORMAL HIGH (ref 11–307)

## 2020-01-23 MED ORDER — METOPROLOL TARTRATE 5 MG/5ML IV SOLN
5.0000 mg | Freq: Four times a day (QID) | INTRAVENOUS | Status: DC
  Administered 2020-01-23 – 2020-01-28 (×18): 5 mg via INTRAVENOUS
  Filled 2020-01-23 (×19): qty 5

## 2020-01-23 MED ORDER — METHYLPREDNISOLONE SODIUM SUCC 40 MG IJ SOLR
40.0000 mg | Freq: Two times a day (BID) | INTRAMUSCULAR | Status: AC
  Administered 2020-01-23 – 2020-01-24 (×3): 40 mg via INTRAVENOUS
  Filled 2020-01-23 (×3): qty 1

## 2020-01-23 MED ORDER — POTASSIUM CHLORIDE 10 MEQ/100ML IV SOLN
10.0000 meq | INTRAVENOUS | Status: AC
  Administered 2020-01-23 (×3): 10 meq via INTRAVENOUS
  Filled 2020-01-23 (×3): qty 100

## 2020-01-23 MED ORDER — CLONIDINE HCL 0.1 MG PO TABS
0.1000 mg | ORAL_TABLET | Freq: Three times a day (TID) | ORAL | Status: DC
  Administered 2020-01-23: 0.1 mg via ORAL
  Filled 2020-01-23: qty 1

## 2020-01-23 MED ORDER — ORAL CARE MOUTH RINSE
15.0000 mL | Freq: Two times a day (BID) | OROMUCOSAL | Status: DC
  Administered 2020-01-23 – 2020-01-26 (×7): 15 mL via OROMUCOSAL

## 2020-01-23 MED ORDER — CHLORHEXIDINE GLUCONATE 0.12 % MT SOLN
15.0000 mL | Freq: Two times a day (BID) | OROMUCOSAL | Status: DC
  Administered 2020-01-23 – 2020-02-08 (×30): 15 mL via OROMUCOSAL
  Filled 2020-01-23 (×21): qty 15

## 2020-01-23 MED ORDER — METOPROLOL TARTRATE 5 MG/5ML IV SOLN
2.5000 mg | Freq: Four times a day (QID) | INTRAVENOUS | Status: DC
  Administered 2020-01-23: 2.5 mg via INTRAVENOUS
  Filled 2020-01-23: qty 5

## 2020-01-23 NOTE — TOC Progression Note (Signed)
Transition of Care [facility]) - Progression Note    Patient Details  Name: Julie Mcguire MRN: [id number] Date of Birth: 1956/01/08  [facility] of [facility] CM/SW Contact  Golda Acre, RN Phone Number: 01/23/2020, 8:05 AM  Clinical Narrative:    Continues to periods of hypoxia and requiring hfnc and nrb at 45l/min, iv solu medrol, d5w at 75cc/hr, palliative care is seeing patient and in contact with husband due to guarded prognosis. Following for progression and toc needs.   Expected Discharge Plan: Home/Self Care Barriers to Discharge: Continued Medical Work up  Expected Discharge Plan and Services Expected Discharge Plan: Home/Self Care   Discharge Planning Services: CM Consult   Living arrangements for the past 2 months: Single Family Home                                       Social Determinants of Health (SDOH) Interventions    Readmission Risk Interventions No flowsheet data found.

## 2020-01-23 NOTE — Progress Notes (Signed)
Julie Mcguire  FAO:[id number] DOB: [birth year] DOA: 12/19/2019 PCP: Elias Else, MD    Brief Narrative:  64 year old with a history of recent COVID pneumonia, HTN, and obesity who returned to the ED with worsening shortness of breath.  She was discharged from the hospital 8/27 following treatment for Covid pneumonia with steroids, remdesivir, and baricitinib.  At the time of discharge she required 4 L oxygen support.  When EMS arrived at her home she was found to be saturating 75% on 6 L of nasal cannula and required 15 L via nonrebreather mask to get saturations up to 85%.  In the ED CXR noted moderate to severe bilateral patchy infiltrates and the patient was readmitted to the hospital with refractory Covid pneumonia.  Significant Events:  8/24 initial admission to hospital - steroids, remdesivir, and baricitinib 8/27 discharge home after treatment for Covid pneumonia 8/31 readmit for refractory Covid pneumonia  Date of Positive COVID Test:  01/07/2020  Vaccination Status: NOT vaccinated   COVID-19 specific Treatment: Solu-Medrol 8/24 > 8/27 + 8/31 > Baricitinib 8/24 > 8/27 + 8/31 > 9/9 Remdesivir 8/24 > 8/27 + 8/31 > 9/4  Antimicrobials:  Azithromycin 9/6 > 9/8 Rocephin 9/6 > 9/8  DVT prophylaxis: Lovenox  Subjective: Continues to require heated high flow support 90% FiO2 and 45 L of flow.  Blood pressure trending upward.  Patient is much more sedate today but does continue to have episodes where she reaches for her mask when disturbed.  No evidence of uncontrolled pain.  Assessment & Plan:  Acute hypoxic respiratory failure -COVID Pneumonia Has completed a 5-day course of remdesivir -continue baricitinib - Solu-Medrol continues - prone frequently -titrate oxygen as required -utilizing BiPAP as needed in ICU setting  Recent Labs  Lab 01/19/20 0554 01/20/20 0957 01/21/20 0232 01/22/20 0328 01/23/20 0233  DDIMER 5.76* 2.68* 2.62* 2.36* 2.56*  FERRITIN  --   --   --    --  595*  CRP 0.9 2.0* 3.5* 1.9* 0.8  ALT PROCALCITON  --  <0.10 <0.10 <0.10  --     Possible secondary bacterial pneumonia empiric antibiotic coverage has been prescribed -procalcitonin not convincing of bacterial pneumonia -discontinue antibiotics after 3 complete days of treatment  Elevated D-dimer Venous duplex negative for DVT - CTa chest negative for PE  Hypernatremia Cont free water via IV as confusion prohibits signif oral intake at present   Recent Labs  Lab 01/19/20 0554 01/20/20 0957 01/21/20 0232 01/22/20 0328 01/23/20 0233  NA 145 148* 149* 157* 155*    Steroid-induced hyperglycemia No known history of DM - check A1c in a.m. - CBG not markedly elevated - wean steroid asap  Hypokalemia  Supplemental KCl per IV   Essential HTN BP not at goal - adjust tx and follow trend   Morbid obesity - Body mass index is 43.25 kg/m.   Staph epi in 1 of 2 blood cx Most c/w a contaminant    Code Status: FULL CODE Family Communication:  Status is: Inpatient  Remains inpatient appropriate because:IV treatments appropriate due to intensity of illness or inability to take PO and Inpatient level of care appropriate due to severity of illness   Dispo:  Patient From: Home  Planned Disposition: To be determined  Expected discharge date: 01/25/20  Medically stable for discharge: No   Consultants:  Palliative Care  Objective: Blood pressure (!) 167/70, pulse 82, temperature 97.7 F (36.5 C), temperature source Axillary, resp. rate 13, height  5\' 4"  (1.626 m), weight 114.3 kg, SpO2 91 %.  Intake/Output Summary (Last 24 hours) at 01/23/2020 03/24/2020 Last data filed at 01/23/2020 03/24/2020 Gross per 24 hour  Intake 832.67 ml  Output 875 ml  Net -42.33 ml   Filed Weights   01/15/20 1916 01/19/20 0500 01/20/20 0441  Weight: 119 kg 115.8 kg 114.3 kg    Examination: General: Not in extremis -sedate Lungs: Fine crackles diffusely -no wheezing Cardiovascular:  Mild tachycardia without murmur -regular Abdomen: Nondistended, soft, bowel sounds hypoactive but present, no mass no rebound Extremities: Trace bilateral lower extremity edema without cyanosis or clubbing.  CBC: Recent Labs  Lab 01/17/20 0431 01/17/20 0431 01/18/20 0448 01/18/20 0448 01/19/20 0554 01/20/20 0957 01/21/20 0232 01/22/20 0328 01/23/20 0233  WBC 15.0*   < > 9.2   < > 19.3*   < > 20.8* 14.5* 13.2*  NEUTROABS 12.7*  --  8.1*  --  17.2*  --   --   --   --   HGB 12.9   < > 12.2   < > 13.1   < > 12.8 12.3 12.1  HCT 41.0   < > 37.4   < > 40.6   < > 40.3 39.8 39.3  MCV 95.6   < > 91.9   < > 93.3   < > 96.2 96.8 98.7  PLT 321   < > 243   < > 278   < > 218 241 291   < > = values in this interval not displayed.   Basic Metabolic Panel: Recent Labs  Lab 01/17/20 0431 01/17/20 0431 01/18/20 0448 01/18/20 0448 01/19/20 0554 01/20/20 0957 01/21/20 0232 01/22/20 0328 01/23/20 0233  NA 144   < > 144   < > 145   < > 149* 157* 155*  K 3.7   < > 3.7   < > 3.5   < > 4.0 4.3 3.4*  CL 103   < > 100   < > 98   < > 102 106 110  CO2 29   < > 34*   < > 34*   < > 35* 36* 36*  GLUCOSE 163*   < > 158*   < > 155*   < > 195* 186* 208*  BUN 41*   < > 64*   < > 75*   < > 52* 55* 55*  CREATININE 1.06*   < > 1.40*   < > 1.33*   < > 1.07* 1.03* 0.99  CALCIUM 8.6*   < > 8.6*   < > 8.9   < > 8.6* 9.3 8.9  MG 2.4   < > 2.3   < > 2.5*   < > 2.8* 3.0* 3.0*  PHOS 4.4  --  5.0*  --  4.6  --   --   --   --    < > = values in this interval not displayed.   GFR: Estimated Creatinine Clearance: 74 mL/min (by C-G formula based on SCr of 0.99 mg/dL).  Liver Function Tests: Recent Labs  Lab 01/20/20 0957 01/21/20 0232 01/22/20 0328 01/23/20 0233  AST 16 18 19 22   ALT 25 23 21 28   ALKPHOS 52 47 47 46  BILITOT 0.7 0.9 0.9 0.7  PROT 6.5 6.2* 5.9* 5.8*  ALBUMIN 3.1* 2.8* 2.7* 2.7*   No results for input(s): LIPASE, AMYLASE in the last 168 hours. No results for input(s): AMMONIA in the last  168 hours.  Coagulation Profile:  No results for input(s): INR, PROTIME in the last 168 hours.  Cardiac Enzymes: No results for input(s): CKTOTAL, CKMB, CKMBINDEX, TROPONINI in the last 168 hours.  HbA1C: No results found for: HGBA1C  CBG: Recent Labs  Lab 01/21/20 2158 01/22/20 0749 01/22/20 1129 01/22/20 1708 01/22/20 2146  GLUCAP 165* 185* 157* 159* 183*    Recent Results (from the past 240 hour(s))  Blood Culture (routine x 2)     Status: None   Collection Time: 12/22/2019  3:54 PM   Specimen: BLOOD  Result Value Ref Range Status   Specimen Description   Final    BLOOD RIGHT ANTECUBITAL Performed at [facility], [address] [place], [place] [address]    Special Requests   Final    BOTTLES DRAWN AEROBIC AND ANAEROBIC Blood Culture results may not be optimal due to an inadequate volume of blood received in culture bottles Performed at [facility], [address], [place], [place] [address]    Culture   Final    NO GROWTH 5 DAYS Performed at [facility], [address] [place], [place] [address]    Report Status 01/19/2020 FINAL  Final  Blood Culture (routine x 2)     Status: Abnormal   Collection Time: 12/29/2019  4:00 PM   Specimen: BLOOD LEFT HAND  Result Value Ref Range Status   Specimen Description   Final    BLOOD LEFT HAND Performed at [facility], [address] [place], [place] [address]    Special Requests   Final    BOTTLES DRAWN AEROBIC AND ANAEROBIC Blood Culture adequate volume Performed at [facility], [address], [place], [place] [address]    Culture  Setup Time   Final    GRAM POSITIVE COCCI IN CLUSTERS ANAEROBIC BOTTLE ONLY Organism ID to follow CRITICAL RESULT CALLED TO, READ BACK BY AND VERIFIED WITH: Damaris Hippo [facility] 01/15/20 2154 JDW    Culture (A)  Final    STAPHYLOCOCCUS EPIDERMIDIS THE SIGNIFICANCE OF ISOLATING THIS ORGANISM FROM A SINGLE  SET OF BLOOD CULTURES WHEN MULTIPLE SETS ARE DRAWN IS UNCERTAIN. PLEASE NOTIFY THE MICROBIOLOGY DEPARTMENT WITHIN ONE WEEK IF SPECIATION AND SENSITIVITIES ARE REQUIRED. Performed at [facility], [address], [place], [place] [address]    Report Status 01/17/2020 FINAL  Final  Blood Culture ID Panel (Reflexed)     Status: Abnormal   Collection Time: 01/11/2020  4:00 PM  Result Value Ref Range Status   Enterococcus faecalis NOT DETECTED NOT DETECTED Final   Enterococcus Faecium NOT DETECTED NOT DETECTED Final   Listeria monocytogenes NOT DETECTED NOT DETECTED Final   Staphylococcus species DETECTED (A) NOT DETECTED Final    Comment: CRITICAL RESULT CALLED TO, READ BACK BY AND VERIFIED WITH: Damaris Hippo [facility] 01/15/20 2154 JDW    Staphylococcus aureus (BCID) NOT DETECTED NOT DETECTED Final   Staphylococcus epidermidis DETECTED (A) NOT DETECTED Final    Comment: CRITICAL RESULT CALLED TO, READ BACK BY AND VERIFIED WITH: Damaris Hippo [facility] 01/15/20 2154 JDW    Staphylococcus lugdunensis NOT DETECTED NOT DETECTED Final   Streptococcus species NOT DETECTED NOT DETECTED Final   Streptococcus agalactiae NOT DETECTED NOT DETECTED Final   Streptococcus pneumoniae NOT DETECTED NOT DETECTED Final   Streptococcus pyogenes NOT DETECTED NOT DETECTED Final   A.calcoaceticus-baumannii NOT DETECTED NOT DETECTED Final   Bacteroides fragilis NOT DETECTED NOT DETECTED Final   Enterobacterales NOT DETECTED NOT DETECTED Final  Enterobacter cloacae complex NOT DETECTED NOT DETECTED Final   Escherichia coli NOT DETECTED NOT DETECTED Final   Klebsiella aerogenes NOT DETECTED NOT DETECTED Final   Klebsiella oxytoca NOT DETECTED NOT DETECTED Final   Klebsiella pneumoniae NOT DETECTED NOT DETECTED Final   Proteus species NOT DETECTED NOT DETECTED Final   Salmonella species NOT DETECTED NOT DETECTED Final   Serratia marcescens NOT DETECTED NOT DETECTED Final   Haemophilus influenzae NOT DETECTED NOT  DETECTED Final   Neisseria meningitidis NOT DETECTED NOT DETECTED Final   Pseudomonas aeruginosa NOT DETECTED NOT DETECTED Final   Stenotrophomonas maltophilia NOT DETECTED NOT DETECTED Final   Candida albicans NOT DETECTED NOT DETECTED Final   Candida auris NOT DETECTED NOT DETECTED Final   Candida glabrata NOT DETECTED NOT DETECTED Final   Candida krusei NOT DETECTED NOT DETECTED Final   Candida parapsilosis NOT DETECTED NOT DETECTED Final   Candida tropicalis NOT DETECTED NOT DETECTED Final   Cryptococcus neoformans/gattii NOT DETECTED NOT DETECTED Final   Methicillin resistance mecA/C NOT DETECTED NOT DETECTED Final    Comment: Performed at [facility], [address], [place], [place] [address]  Culture, sputum-assessment     Status: None   Collection Time: 01/20/20 11:45 AM   Specimen: Expectorated Sputum  Result Value Ref Range Status   Specimen Description Expect. Sput  Final   Special Requests NONE  Final   Sputum evaluation   Final    THIS SPECIMEN IS ACCEPTABLE FOR SPUTUM CULTURE Performed at [facility], [address] [place], [place] [address]    Report Status 01/20/2020 FINAL  Final  Culture, respiratory     Status: None   Collection Time: 01/20/20 11:45 AM  Result Value Ref Range Status   Specimen Description   Final    Expect. Sput Performed at [facility], [address] [place], [place] [address]    Special Requests   Final    NONE Reflexed from [id number] Performed at [facility], [address] [place], [place] [address]    Gram Stain   Final    FEW WBC PRESENT,BOTH PMN AND MONONUCLEAR ABUNDANT GRAM POSITIVE COCCI RARE GRAM NEGATIVE RODS RARE YEAST    Culture   Final    ABUNDANT Consistent with normal respiratory flora. No Pseudomonas species isolated Performed at [facility], [address], [place], [place] [address]    Report Status 01/22/2020 FINAL  Final  MRSA PCR  Screening     Status: None   Collection Time: 01/20/20  5:09 PM   Specimen: Nasopharyngeal  Result Value Ref Range Status   MRSA by PCR NEGATIVE NEGATIVE Final    Comment:        The GeneXpert MRSA Assay (FDA approved for NASAL specimens only), is one component of a comprehensive MRSA colonization surveillance program. It is not intended to diagnose MRSA infection nor to guide or monitor treatment for MRSA infections. Performed at [facility], [address] [place], [place] [address]      Scheduled Meds: . amLODipine  10 mg Oral Daily  . vitamin C  500 mg Oral Daily  . baricitinib  4 mg Oral Daily  . Chlorhexidine Gluconate Cloth  6 each Topical Daily  . enoxaparin (LOVENOX) injection  60 mg Subcutaneous Q24H  . insulin aspart  0-9 Units Subcutaneous TID WC  . mouth rinse  15 mL Mouth Rinse BID  . methylPREDNISolone (SOLU-MEDROL) injection  60 mg  Intravenous Q12H  . metoprolol succinate  100 mg Oral Daily  . oxybutynin  5 mg Oral BID  . QUEtiapine  25 mg Oral QHS  . zinc sulfate  220 mg Oral Daily   Continuous Infusions: . dextrose 75 mL/hr at 01/23/20 0649     LOS: 9 days   Lonia BloodJeffrey T. Denvil Canning, MD [facility] Office  [phone] Pager - Text Page per Amion  If 7PM-7AM, please contact night-coverage per Amion 01/23/2020, 7:12 AM

## 2020-01-23 NOTE — Progress Notes (Signed)
PT Cancellation Note  Patient Details Name: RAYEL SANTIZO MRN: [id number] DOB: 1956-03-10   Cancelled Treatment:    Reason Eval/Treat Not Completed: Medical issues which prohibited therapy, on NRB and HHF, restless, restrained. Check back another day.    Rada Hay 01/23/2020, 8:58 AM Blanchard Kelch PT Acute Rehabilitation Services Pager [phone] Office [phone]

## 2020-01-24 LAB — COMPREHENSIVE METABOLIC PANEL
ALT: 43 U/L (ref 0–44)
AST: 33 U/L (ref 15–41)
Albumin: 2.7 g/dL — ABNORMAL LOW (ref 3.5–5.0)
Alkaline Phosphatase: 47 U/L (ref 38–126)
Anion gap: 10 (ref 5–15)
BUN: 43 mg/dL — ABNORMAL HIGH (ref 8–23)
CO2: 35 mmol/L — ABNORMAL HIGH (ref 22–32)
Calcium: 8.7 mg/dL — ABNORMAL LOW (ref 8.9–10.3)
Chloride: 109 mmol/L (ref 98–111)
Creatinine, Ser: 0.8 mg/dL (ref 0.44–1.00)
GFR calc Af Amer: >60 mL/min (ref >60)
GFR calc non Af Amer: >60 mL/min (ref >60)
Glucose, Bld: 173 mg/dL — ABNORMAL HIGH (ref 70–99)
Potassium: 3.9 mmol/L (ref 3.5–5.1)
Sodium: 154 mmol/L — ABNORMAL HIGH (ref 135–145)
Total Bilirubin: 0.8 mg/dL (ref 0.3–1.2)
Total Protein: 5.6 g/dL — ABNORMAL LOW (ref 6.5–8.1)

## 2020-01-24 LAB — CBC
HCT: 39.6 % (ref 36.0–46.0)
Hemoglobin: 12 g/dL (ref 12.0–15.0)
MCH: 30.3 pg (ref 26.0–34.0)
MCHC: 30.3 g/dL (ref 30.0–36.0)
MCV: 100 fL (ref 80.0–100.0)
Platelets: 299 10*3/uL (ref 150–400)
RBC: 3.96 MIL/uL (ref 3.87–5.11)
RDW: 13.8 % (ref 11.5–15.5)
WBC: 17 10*3/uL — ABNORMAL HIGH (ref 4.0–10.5)
nRBC: 0 % (ref 0.0–0.2)

## 2020-01-24 LAB — GLUCOSE, CAPILLARY
Glucose-Capillary: 154 mg/dL — ABNORMAL HIGH (ref 70–99)
Glucose-Capillary: 144 mg/dL — ABNORMAL HIGH (ref 70–99)
Glucose-Capillary: 148 mg/dL — ABNORMAL HIGH (ref 70–99)
Glucose-Capillary: 149 mg/dL — ABNORMAL HIGH (ref 70–99)

## 2020-01-24 LAB — D-DIMER, QUANTITATIVE: D-Dimer, Quant: 1.97 ug/mL-FEU — ABNORMAL HIGH (ref 0.00–0.50)

## 2020-01-24 LAB — C-REACTIVE PROTEIN: CRP: 0.6 mg/dL (ref <1.0)

## 2020-01-24 LAB — MAGNESIUM: Magnesium: 2.9 mg/dL — ABNORMAL HIGH (ref 1.7–2.4)

## 2020-01-24 LAB — FERRITIN: Ferritin: 584 ng/mL — ABNORMAL HIGH (ref 11–307)

## 2020-01-24 MED ORDER — METHYLPREDNISOLONE SODIUM SUCC 40 MG IJ SOLR
20.0000 mg | Freq: Two times a day (BID) | INTRAMUSCULAR | Status: DC
  Administered 2020-01-25 – 2020-01-28 (×7): 20 mg via INTRAVENOUS
  Filled 2020-01-24 (×7): qty 1

## 2020-01-24 MED ORDER — CLONIDINE HCL 0.1 MG/24HR TD PTWK
0.1000 mg | MEDICATED_PATCH | TRANSDERMAL | Status: DC
  Administered 2020-01-24: 0.1 mg via TRANSDERMAL
  Filled 2020-01-24: qty 1

## 2020-01-24 MED ORDER — NYSTATIN 100000 UNIT/GM EX POWD
Freq: Two times a day (BID) | CUTANEOUS | Status: DC
  Administered 2020-01-25 – 2020-02-04 (×3): 1 via TOPICAL
  Filled 2020-01-24 (×3): qty 15

## 2020-01-24 MED ORDER — BARICITINIB 2 MG PO TABS: 4.0000 mg | ORAL_TABLET | Freq: Once | ORAL | Status: DC

## 2020-01-24 NOTE — Progress Notes (Signed)
Julie Mcguire  EEF:[id number] DOB: 1955-09-24 DOA: 01/08/2020 PCP: Elias Else, MD    Brief Narrative:  64 year old with a history of recent COVID pneumonia, HTN, and obesity who returned to the ED with worsening shortness of breath. She was discharged from the hospital 8/27 following treatment for Covid pneumonia with steroids, remdesivir, and baricitinib.  At the time of discharge she required 4 L oxygen support. When EMS arrived at her home she was found to be saturating 75% on 6 L of nasal cannula and required 15 L via nonrebreather mask to get saturations up to 85%. In the ED CXR noted moderate to severe bilateral patchy infiltrates and the patient was readmitted to the hospital with refractory Covid pneumonia.  Significant Events:  8/24 initial admission to hospital - steroids, remdesivir, and baricitinib 8/27 discharge home after treatment for Covid pneumonia 8/31 readmit for refractory Covid pneumonia  Date of Positive COVID Test:  01/07/2020  Vaccination Status: NOT vaccinated   COVID-19 specific Treatment: Solu-Medrol 8/24 > 8/27 + 8/31 > Baricitinib 8/24 > 8/27 + 8/31 > 9/9 Remdesivir 8/24 > 8/27 + 8/31 > 9/4  Antimicrobials:  Azithromycin 9/6 > 9/8 Rocephin 9/6 > 9/8  DVT prophylaxis: Lovenox  Subjective: Blood pressure not well controlled.  Continues to require heated high flow nasal cannula support at 100% FiO2 and 45 L of flow.  Afebrile.  Remains too confused to allow consistent oral intake.  Assessment & Plan:  Acute hypoxic respiratory failure -COVID Pneumonia Has completed a 5-day course of remdesivir -continue baricitinib to complete a 14th dose today - Solu-Medrol continues - prone frequently -titrate oxygen as required -utilizing BiPAP as needed in ICU setting - no signs of signif improvement thus far - inflammatory markers are trending downward  Recent Labs  Lab 01/20/20 0957 01/21/20 0232 01/22/20 0328 01/23/20 0233 01/24/20 0238  DDIMER 2.68*  2.62* 2.36* 2.56* 1.97*  FERRITIN  --   --   --  595* 584*  CRP 2.0* 3.5* 1.9* 0.8 0.6  ALT 25 23 21 28  43  PROCALCITON <0.10 <0.10 <0.10  --   --     Possible secondary bacterial pneumonia procalcitonin not convincing of bacterial pneumonia - discontinued empiric antibiotics after 3 complete days of treatment  Elevated D-dimer Venous duplex negative for DVT - CTa chest negative for PE  Hypernatremia Cont free water via IV as confusion prohibits signif oral intake at present - will need to consider NG tube for feeds in next 24hrs   Recent Labs  Lab 01/20/20 0957 01/21/20 0232 01/22/20 0328 01/23/20 0233 01/24/20 0238  NA 148* 149* 157* 155* 154*    Steroid-induced hyperglycemia No known history of DM - A1c 6.4 - CBG not markedly elevated - wean steroid asap  Hypokalemia  Corrected w/ supplemental KCl  Essential HTN BP not at goal - adding clonidine today   Morbid obesity - Body mass index is 43.25 kg/m.   Staph epi in 1 of 2 blood cx Most c/w a contaminant    Code Status: FULL CODE Family Communication: called placed to husband's home phone # but got no answer Status is: Inpatient  Remains inpatient appropriate because:IV treatments appropriate due to intensity of illness or inability to take PO and Inpatient level of care appropriate due to severity of illness   Dispo:  Patient From: Home  Planned Disposition: To be determined  Expected discharge date: 01/25/20  Medically stable for discharge: No   Consultants:  Palliative Care  Objective: Blood pressure 03/26/20)  150/78, pulse 79, temperature 98 F (36.7 C), temperature source Axillary, resp. rate 17, height 5\' 4"  (1.626 m), weight 114.3 kg, SpO2 92 %.  Intake/Output Summary (Last 24 hours) at 01/24/2020 0821 Last data filed at 01/24/2020 0600 Gross per 24 hour  Intake 1524.97 ml  Output 1135 ml  Net 389.97 ml   Filed Weights   01/15/20 1916 01/19/20 0500 01/20/20 0441  Weight: 119 kg 115.8 kg 114.3  kg    Examination: General: Not in extremis - moaning and mildly agitated  Lungs: Fine crackles diffusely w/o wheezing Cardiovascular: Mild tachycardia - no murmur or rub  Abdomen: Nondistended, soft, bowel sounds hypoactive but present, no mass or rebound Extremities: Trace B LE  edema without cyanosis or clubbing.  CBC: Recent Labs  Lab 01/18/20 0448 01/18/20 0448 01/19/20 0554 01/20/20 0957 01/22/20 0328 01/23/20 0233 01/24/20 0238  WBC 9.2   < > 19.3*   < > 14.5* 13.2* 17.0*  NEUTROABS 8.1*  --  17.2*  --   --   --   --   HGB 12.2   < > 13.1   < > 12.3 12.1 12.0  HCT 37.4   < > 40.6   < > 39.8 39.3 39.6  MCV 91.9   < > 93.3   < > 96.8 98.7 100.0  PLT 243   < > 278   < > 241 291 299   < > = values in this interval not displayed.   Basic Metabolic Panel: Recent Labs  Lab 01/18/20 0448 01/18/20 0448 01/19/20 0554 01/20/20 0957 01/22/20 0328 01/23/20 0233 01/24/20 0238  NA 144   < > 145   < > 157* 155* 154*  K 3.7   < > 3.5   < > 4.3 3.4* 3.9  CL 100   < > 98   < > 106 110 109  CO2 34*   < > 34*   < > 36* 36* 35*  GLUCOSE 158*   < > 155*   < > 186* 208* 173*  BUN 64*   < > 75*   < > 55* 55* 43*  CREATININE 1.40*   < > 1.33*   < > 1.03* 0.99 0.80  CALCIUM 8.6*   < > 8.9   < > 9.3 8.9 8.7*  MG 2.3   < > 2.5*   < > 3.0* 3.0* 2.9*  PHOS 5.0*  --  4.6  --   --   --   --    < > = values in this interval not displayed.   GFR: Estimated Creatinine Clearance: 91.5 mL/min (by C-G formula based on SCr of 0.8 mg/dL).  Liver Function Tests: Recent Labs  Lab 01/21/20 0232 01/22/20 0328 01/23/20 0233 01/24/20 0238  AST 18 19 22  33  ALT 23 21 28  43  ALKPHOS 47 47 46 47  BILITOT 0.9 0.9 0.7 0.8  PROT 6.2* 5.9* 5.8* 5.6*  ALBUMIN 2.8* 2.7* 2.7* 2.7*   No results for input(s): LIPASE, AMYLASE in the last 168 hours. No results for input(s): AMMONIA in the last 168 hours.  Coagulation Profile: No results for input(s): INR, PROTIME in the last 168 hours.  Cardiac  Enzymes: No results for input(s): CKTOTAL, CKMB, CKMBINDEX, TROPONINI in the last 168 hours.  HbA1C: Hgb A1c MFr Bld  Date/Time Value Ref Range Status  01/23/2020 02:33 AM 6.4 (H) 4.8 - 5.6 % Final    Comment:    (NOTE) Pre diabetes:  5.7%-6.4%  Diabetes:              >6.4%  Glycemic control for   <7.0% adults with diabetes     CBG: Recent Labs  Lab 01/22/20 2146 01/23/20 0803 01/23/20 1130 01/23/20 1634 01/23/20 2111  GLUCAP 183* 171* 149* 153* 149*    Recent Results (from the past 240 hour(s))  Blood Culture (routine x 2)     Status: None   Collection Time: 11-Feb-2020  3:54 PM   Specimen: BLOOD  Result Value Ref Range Status   Specimen Description   Final    BLOOD RIGHT ANTECUBITAL Performed at [facility], [address], [place], [place] [address]    Special Requests   Final    BOTTLES DRAWN AEROBIC AND ANAEROBIC Blood Culture results may not be optimal due to an inadequate volume of blood received in culture bottles Performed at [facility], [address] [place], [place] [address]    Culture   Final    NO GROWTH 5 DAYS Performed at [facility], [address] [place], [place] [address]    Report Status 01/19/2020 FINAL  Final  Blood Culture (routine x 2)     Status: Abnormal   Collection Time: 2020-02-11  4:00 PM   Specimen: BLOOD LEFT HAND  Result Value Ref Range Status   Specimen Description   Final    BLOOD LEFT HAND Performed at [facility], [address] [place], [place] [address]    Special Requests   Final    BOTTLES DRAWN AEROBIC AND ANAEROBIC Blood Culture adequate volume Performed at [facility], [address], [place], [place] [address]    Culture  Setup Time   Final    GRAM POSITIVE COCCI IN CLUSTERS ANAEROBIC BOTTLE ONLY Organism ID to follow CRITICAL RESULT CALLED TO, READ BACK BY AND VERIFIED WITH: Damaris Hippo [facility] 01/15/20 2154 JDW     Culture (A)  Final    STAPHYLOCOCCUS EPIDERMIDIS THE SIGNIFICANCE OF ISOLATING THIS ORGANISM FROM A SINGLE SET OF BLOOD CULTURES WHEN MULTIPLE SETS ARE DRAWN IS UNCERTAIN. PLEASE NOTIFY THE MICROBIOLOGY DEPARTMENT WITHIN ONE WEEK IF SPECIATION AND SENSITIVITIES ARE REQUIRED. Performed at [facility], [address] [place], [place] [address]    Report Status 01/17/2020 FINAL  Final  Blood Culture ID Panel (Reflexed)     Status: Abnormal   Collection Time: 02-11-2020  4:00 PM  Result Value Ref Range Status   Enterococcus faecalis NOT DETECTED NOT DETECTED Final   Enterococcus Faecium NOT DETECTED NOT DETECTED Final   Listeria monocytogenes NOT DETECTED NOT DETECTED Final   Staphylococcus species DETECTED (A) NOT DETECTED Final    Comment: CRITICAL RESULT CALLED TO, READ BACK BY AND VERIFIED WITH: Damaris Hippo [facility] 01/15/20 2154 JDW    Staphylococcus aureus (BCID) NOT DETECTED NOT DETECTED Final   Staphylococcus epidermidis DETECTED (A) NOT DETECTED Final    Comment: CRITICAL RESULT CALLED TO, READ BACK BY AND VERIFIED WITH: Damaris Hippo [facility] 01/15/20 2154 JDW    Staphylococcus lugdunensis NOT DETECTED NOT DETECTED Final   Streptococcus species NOT DETECTED NOT DETECTED Final   Streptococcus agalactiae NOT DETECTED NOT DETECTED Final   Streptococcus pneumoniae NOT DETECTED NOT DETECTED Final   Streptococcus pyogenes NOT DETECTED NOT DETECTED Final   A.calcoaceticus-baumannii NOT DETECTED NOT DETECTED Final   Bacteroides fragilis NOT DETECTED NOT DETECTED Final   Enterobacterales NOT DETECTED NOT DETECTED Final   Enterobacter cloacae  complex NOT DETECTED NOT DETECTED Final   Escherichia coli NOT DETECTED NOT DETECTED Final   Klebsiella aerogenes NOT DETECTED NOT DETECTED Final   Klebsiella oxytoca NOT DETECTED NOT DETECTED Final   Klebsiella pneumoniae NOT DETECTED NOT DETECTED Final   Proteus species NOT DETECTED NOT DETECTED Final   Salmonella species NOT DETECTED NOT DETECTED  Final   Serratia marcescens NOT DETECTED NOT DETECTED Final   Haemophilus influenzae NOT DETECTED NOT DETECTED Final   Neisseria meningitidis NOT DETECTED NOT DETECTED Final   Pseudomonas aeruginosa NOT DETECTED NOT DETECTED Final   Stenotrophomonas maltophilia NOT DETECTED NOT DETECTED Final   Candida albicans NOT DETECTED NOT DETECTED Final   Candida auris NOT DETECTED NOT DETECTED Final   Candida glabrata NOT DETECTED NOT DETECTED Final   Candida krusei NOT DETECTED NOT DETECTED Final   Candida parapsilosis NOT DETECTED NOT DETECTED Final   Candida tropicalis NOT DETECTED NOT DETECTED Final   Cryptococcus neoformans/gattii NOT DETECTED NOT DETECTED Final   Methicillin resistance mecA/C NOT DETECTED NOT DETECTED Final    Comment: Performed at [facility], [address], [place], [place] [address]  Culture, sputum-assessment     Status: None   Collection Time: 01/20/20 11:45 AM   Specimen: Expectorated Sputum  Result Value Ref Range Status   Specimen Description Expect. Sput  Final   Special Requests NONE  Final   Sputum evaluation   Final    THIS SPECIMEN IS ACCEPTABLE FOR SPUTUM CULTURE Performed at [facility], [address] [place], [place] [address]    Report Status 01/20/2020 FINAL  Final  Culture, respiratory     Status: None   Collection Time: 01/20/20 11:45 AM  Result Value Ref Range Status   Specimen Description   Final    Expect. Sput Performed at [facility], [address], [place], [place] [address]    Special Requests   Final    NONE Reflexed from [phone] Performed at [facility], [address] [place], [place] [address]    Gram Stain   Final    FEW WBC PRESENT,BOTH PMN AND MONONUCLEAR ABUNDANT GRAM POSITIVE COCCI RARE GRAM NEGATIVE RODS RARE YEAST    Culture   Final    ABUNDANT Consistent with normal respiratory flora. No Pseudomonas species isolated Performed at [facility], [address], [place], [place] [address]    Report Status 01/22/2020 FINAL  Final  MRSA PCR Screening     Status: None   Collection Time: 01/20/20  5:09 PM   Specimen: Nasopharyngeal  Result Value Ref Range Status   MRSA by PCR NEGATIVE NEGATIVE Final    Comment:        The GeneXpert MRSA Assay (FDA approved for NASAL specimens only), is one component of a comprehensive MRSA colonization surveillance program. It is not intended to diagnose MRSA infection nor to guide or monitor treatment for MRSA infections. Performed at [facility], [address], [place], [place] [address]      Scheduled Meds: . chlorhexidine  15 mL Mouth Rinse BID  . Chlorhexidine Gluconate Cloth  6 each Topical Daily  . enoxaparin (LOVENOX) injection  60 mg Subcutaneous Q24H  . insulin aspart  0-9 Units Subcutaneous TID WC  . mouth rinse  15 mL Mouth Rinse BID  . mouth rinse  15 mL Mouth Rinse q12n4p  . methylPREDNISolone (SOLU-MEDROL) injection  40 mg Intravenous Q12H  . metoprolol tartrate  5  mg Intravenous Q6H  . QUEtiapine  25 mg Oral QHS   Continuous Infusions: . dextrose 75 mL/hr at 01/24/20 0300     LOS: 10 days   Lonia BloodJeffrey T. Bastian Andreoli, MD [facility] Office  [phone] Pager - Text Page per Amion  If 7PM-7AM, please contact night-coverage per Amion 01/24/2020, 8:21 AM

## 2020-01-24 NOTE — Progress Notes (Signed)
Patient unable to take any PO medicine today due to confusion/agitation. Has been offered q2 hours during my shift.

## 2020-01-24 NOTE — Progress Notes (Signed)
PT Cancellation Note  Patient Details Name: Julie Mcguire MRN: [id number] DOB: [birth year]   Cancelled Treatment:    Reason Eval/Treat Not Completed: Medical issues which prohibited therapy (hold therapy today per RN)   Maida Sale E 01/24/2020, 12:57 PM Paulino Door, DPT Acute Rehabilitation Services Pager: [phone] Office: [phone]

## 2020-01-25 ENCOUNTER — Inpatient Hospital Stay (HOSPITAL_COMMUNITY): Payer: BC Managed Care – PPO

## 2020-01-25 DIAGNOSIS — R6510 Systemic inflammatory response syndrome (SIRS) of non-infectious origin without acute organ dysfunction: Secondary | ICD-10-CM | POA: Insufficient documentation

## 2020-01-25 DIAGNOSIS — U071 COVID-19: Secondary | ICD-10-CM | POA: Diagnosis present

## 2020-01-25 DIAGNOSIS — Z7189 Other specified counseling: Secondary | ICD-10-CM

## 2020-01-25 DIAGNOSIS — Z515 Encounter for palliative care: Secondary | ICD-10-CM

## 2020-01-25 LAB — COMPREHENSIVE METABOLIC PANEL
ALT: 112 U/L — ABNORMAL HIGH (ref 0–44)
AST: 88 U/L — ABNORMAL HIGH (ref 15–41)
Albumin: 2.9 g/dL — ABNORMAL LOW (ref 3.5–5.0)
Alkaline Phosphatase: 50 U/L (ref 38–126)
Anion gap: 11 (ref 5–15)
BUN: 40 mg/dL — ABNORMAL HIGH (ref 8–23)
CO2: 38 mmol/L — ABNORMAL HIGH (ref 22–32)
Calcium: 9 mg/dL (ref 8.9–10.3)
Chloride: 103 mmol/L (ref 98–111)
Creatinine, Ser: 0.73 mg/dL (ref 0.44–1.00)
GFR calc Af Amer: >60 mL/min (ref >60)
GFR calc non Af Amer: >60 mL/min (ref >60)
Glucose, Bld: 182 mg/dL — ABNORMAL HIGH (ref 70–99)
Potassium: 4.2 mmol/L (ref 3.5–5.1)
Sodium: 152 mmol/L — ABNORMAL HIGH (ref 135–145)
Total Bilirubin: 1 mg/dL (ref 0.3–1.2)
Total Protein: 5.9 g/dL — ABNORMAL LOW (ref 6.5–8.1)

## 2020-01-25 LAB — CBC
HCT: 38.1 % (ref 36.0–46.0)
Hemoglobin: 11.8 g/dL — ABNORMAL LOW (ref 12.0–15.0)
MCH: 29.8 pg (ref 26.0–34.0)
MCHC: 31 g/dL (ref 30.0–36.0)
MCV: 96.2 fL (ref 80.0–100.0)
Platelets: 315 10*3/uL (ref 150–400)
RBC: 3.96 MIL/uL (ref 3.87–5.11)
RDW: 13.8 % (ref 11.5–15.5)
WBC: 19.7 10*3/uL — ABNORMAL HIGH (ref 4.0–10.5)
nRBC: 0 % (ref 0.0–0.2)

## 2020-01-25 LAB — MAGNESIUM: Magnesium: 2.6 mg/dL — ABNORMAL HIGH (ref 1.7–2.4)

## 2020-01-25 LAB — GLUCOSE, CAPILLARY
Glucose-Capillary: 167 mg/dL — ABNORMAL HIGH (ref 70–99)
Glucose-Capillary: 154 mg/dL — ABNORMAL HIGH (ref 70–99)
Glucose-Capillary: 136 mg/dL — ABNORMAL HIGH (ref 70–99)
Glucose-Capillary: 131 mg/dL — ABNORMAL HIGH (ref 70–99)

## 2020-01-25 MED ORDER — PROSOURCE TF PO LIQD: 45.0000 mL | Freq: Three times a day (TID) | ORAL | Status: DC

## 2020-01-25 MED ORDER — FREE WATER
200.0000 mL | Status: DC
  Administered 2020-01-31 – 2020-02-02 (×12): 200 mL

## 2020-01-25 MED ORDER — OSMOLITE 1.5 CAL PO LIQD
1000.0000 mL | ORAL | Status: DC
  Filled 2020-01-25 (×6): qty 1000

## 2020-01-25 NOTE — Progress Notes (Signed)
01/25/2020 Attempted feeding tube placement by charge RN Huntley Dec but could not be passed through either nasal passage beyond the very tip of the weighted tube. Patient very resistant, yelling and requesting to stop. Dr. Sharon Seller notified of results and nasal feeding tube protocol orders cancelled per MD. To be addressed with potential placement by IR on Monday. Cindy S. Clelia Croft BSN, RN, CCRP 01/25/2020 1:26 PM

## 2020-01-25 NOTE — Progress Notes (Signed)
OT Cancellation Note  Patient Details Name: Julie Mcguire MRN: [id number] DOB: 26-Apr-1956   Cancelled Treatment:    Reason Eval/Treat Not Completed: Medical issues which prohibited therapy. Rn requests therapy hold. Patient poorly responsive, restrained and on 40 Liters Heated high flow. Will f/u as able.  [name] 01/25/2020, 3:41 PM

## 2020-01-25 NOTE — Progress Notes (Signed)
Initial Nutrition Assessment  DOCUMENTATION CODES:   Morbid obesity  INTERVENTION:   Tube Feeding via NG tube (once placed and correct placement confirmed via xray): Osmolite 1.5 at 55 ml/hr Pro-Source TF 45 mL TID Provides 116 g of protein, 2100 kcals and 1003 mL of free water  Add free water flush of 200 mL q 4 hours: total free water 2203 mL  No BM x 10 days; only on miralax prn. Recommend starting aggressive bowel regimen. Made MD aware  Continue Regular diet as able  NUTRITION DIAGNOSIS:   Swallowing difficulty related to acute illness, decreased appetite as evidenced by meal completion < 25%.  GOAL:   Patient will meet greater than or equal to 90% of their needs  MONITOR:   Vent status, Labs, Weight trends, TF tolerance  REASON FOR ASSESSMENT:   Consult Enteral/tube feeding initiation and management  ASSESSMENT:   64 yo unvaccinated female admitted with acute respiratory failure with referactory COVID-19 pneumonia on 8/31. PMH includes HTN.   RD working remotely.  8/24 COVD+ diagnosis, Initial Admit 8/27 D/C Home 8/31 Re-Admit for refractory COVID-19 pneumonia  Noted pt confused and agitated and unable to take PO meds by mouth yesterday. Unable to obtain diet and weight history from patient at this time.   Limited documentation of po intake but recorded po intake 0% of all meals.   Dietitian Consult received for initiation and management of TF; plan for small bore NG tube placement  No documented BM in 10 days; pt only with miralax prn. Recommend starting aggressive bowel regimen.   Pt currently hypernatremia; noted D5 ordered. Plan for free water flushes via NG tube  Weight 114.3 kg on 9/06, no weight since. Weight 119 on 9/01. Possible weight loss.   Labs: sodium 152 (H), Creatinine wdl, CBGs 144-167 Meds: D5 at 75 ml/hr, ss novolog, solumedrol, miralax prn, zofran prn   Diet Order:   Diet Order            Diet regular Room service appropriate?  Yes; Fluid consistency: Thin  Diet effective now                 EDUCATION NEEDS:   Not appropriate for education at this time  Skin:  Skin Assessment: Skin Integrity Issues: Skin Integrity Issues:: Other (Comment) Other: MASD: skin folds, perineal area  Last BM:  PTA (no BM in 10 days)  Height:   Ht Readings from Last 1 Encounters:  01/15/20 5\' 4"  (1.626 m)    Weight:   Wt Readings from Last 1 Encounters:  01/20/20 114.3 kg    Ideal Body Weight:   (IBW 54.5 kg; ajdusted BW:78 kg)  BMI:  Body mass index is 43.25 kg/m.  Estimated Nutritional Needs:   Kcal:  1900-2250 kcals  Protein:  110-125 g   Fluid:  >/= 2 L    03/21/20 MS, RDN, LDN, CNSC Registered Dietitian III Clinical Nutrition RD Pager and On-Call Pager Number Located in [place]

## 2020-01-25 NOTE — Progress Notes (Signed)
Julie Mcguire  OMV:[id number] DOB: 07-Jun-1955 DOA: 01/18/2020 PCP: Elias Else, MD    Brief Narrative:  [phone] with a history of recent COVID pneumonia, HTN, and obesity who returned to the ED with worsening shortness of breath. She was discharged from the hospital 8/27 following treatment for Covid pneumonia with steroids, remdesivir, and baricitinib.  At the time of discharge she required 4 L oxygen support. When EMS arrived at her home she was found to be saturating 75% on 6 L of nasal cannula and required 15 L via nonrebreather mask to get saturations up to 85%. In the ED CXR noted moderate to severe bilateral patchy infiltrates and the patient was readmitted to the hospital with refractory Covid pneumonia.  Significant Events:  8/24 initial admission to hospital - steroids, remdesivir, and baricitinib 8/27 discharge home after treatment for Covid pneumonia 8/31 readmit for refractory Covid pneumonia  Date of Positive COVID Test:  01/07/2020  Vaccination Status: NOT vaccinated   COVID-19 specific Treatment: Solu-Medrol 8/24 > 8/27 + 8/31 > Baricitinib 8/24 > 8/27 + 8/31 > 9/9 Remdesivir 8/24 > 8/27 + 8/31 > 9/4  Antimicrobials:  Azithromycin 9/6 > 9/8 Rocephin 9/6 > 9/8  DVT prophylaxis: Lovenox  Subjective: BP improved this AM. No change in high O2 requirement.  Sodium continues to trend down.  CXR this a.m. as reviewed by this MD notes subtle improvement bilateral pulmonary infiltrates compared to CXR 4 days ago.  LFTs have increased slightly.  The patient remains confused and intermittently agitated.  I spoke with her husband via phone.  He agrees with placement of a feeding tube via the NG route.  He also agrees with CT scan of the head to further evaluate her mental status changes.  Assessment & Plan:  Acute hypoxic respiratory failure -COVID Pneumonia Has completed a 5-day course of remdesivir -continue baricitinib to complete a 14th dose today - Solu-Medrol continues  - prone frequently -titrate oxygen as required -utilizing BiPAP as needed in ICU setting - no signs of signif improvement thus far - inflammatory markers are trending downward  Recent Labs  Lab 01/20/20 0957 01/20/20 0957 01/21/20 0232 01/22/20 0328 01/23/20 0233 01/24/20 0238 01/25/20 0244  DDIMER 2.68*  --  2.62* 2.36* 2.56* 1.97*  --   FERRITIN  --   --   --   --  595* 584*  --   CRP 2.0*  --  3.5* 1.9* 0.8 0.6  --   ALT 25   < > 23 21 28  43 112*  PROCALCITON <0.10  --  <0.10 <0.10  --   --   --    < > = values in this interval not displayed.    Possible secondary bacterial pneumonia procalcitonin not convincing of bacterial pneumonia - discontinued empiric antibiotics after 3 complete days of treatment  Transaminitis Monitor trend -not severely elevated at this time  Recent Labs  Lab 01/21/20 0232 01/22/20 0328 01/23/20 0233 01/24/20 0238 01/25/20 0244  AST 18 19 22  33 88*  ALT 23 21 28  43 112*  ALKPHOS 47 47 46 47 50  BILITOT 0.9 0.9 0.7 0.8 1.0  PROT 6.2* 5.9* 5.8* 5.6* 5.9*  ALBUMIN 2.8* 2.7* 2.7* 2.7* 2.9*    Elevated D-dimer Venous duplex negative for DVT - CTa chest negative for PE  Hypernatremia Cont free water via IV until NG can be placed as confusion prohibits signif oral intake at present - NG feeding tube to be placed today   Recent Labs  Lab 01/21/20 0232 01/22/20 0328 01/23/20 0233 01/24/20 0238 01/25/20 0244  NA 149* 157* 155* 154* 152*    Steroid-induced hyperglycemia No known history of DM - A1c 6.4 - CBG not markedly elevated - wean steroid asap  Hypokalemia  Corrected w/ supplemental KCl  Essential HTN BP not at goal - added clonidine 9/10 - no need for more acute change presently   Morbid obesity - Body mass index is 43.25 kg/m.   Staph epi in 1 of 2 blood cx Most c/w a contaminant    Code Status: FULL CODE Family Communication: spoke w/ husband via phone  Status is: Inpatient  Remains inpatient appropriate because:IV  treatments appropriate due to intensity of illness or inability to take PO and Inpatient level of care appropriate due to severity of illness   Dispo:  Patient From: Home  Planned Disposition: To be determined  Expected discharge date: 01/25/20  Medically stable for discharge: No   Consultants:  Palliative Care  Objective: Blood pressure 126/83, pulse 65, temperature 97.9 F (36.6 C), temperature source Axillary, resp. rate 16, height 5\' 4"  (1.626 m), weight 114.3 kg, SpO2 93 %.  Intake/Output Summary (Last 24 hours) at 01/25/2020 0742 Last data filed at 01/25/2020 0700 Gross per 24 hour  Intake 2060.04 ml  Output 0 ml  Net 2060.04 ml   Filed Weights   01/15/20 1916 01/19/20 0500 01/20/20 0441  Weight: 119 kg 115.8 kg 114.3 kg    Examination: General: Not in extremis -sedate at time of exam today Lungs: Fine crackles diffusely without change Cardiovascular: Mild tachycardia - no murmur or rub  Abdomen: Nondistended, soft, bowel sounds hypoactive but present, no mass Extremities: Trace B LE  edema -no cyanosis  CBC: Recent Labs  Lab 01/19/20 0554 01/20/20 0957 01/23/20 0233 01/24/20 0238 01/25/20 0244  WBC 19.3*   < > 13.2* 17.0* 19.7*  NEUTROABS 17.2*  --   --   --   --   HGB 13.1   < > 12.1 12.0 11.8*  HCT 40.6   < > 39.3 39.6 38.1  MCV 93.3   < > 98.7 100.0 96.2  PLT 278   < > 291 299 315   < > = values in this interval not displayed.   Basic Metabolic Panel: Recent Labs  Lab 01/19/20 0554 01/20/20 0957 01/23/20 0233 01/24/20 0238 01/25/20 0244  NA 145   < > 155* 154* 152*  K 3.5   < > 3.4* 3.9 4.2  CL 98   < > 110 109 103  CO2 34*   < > 36* 35* 38*  GLUCOSE 155*   < > 208* 173* 182*  BUN 75*   < > 55* 43* 40*  CREATININE 1.33*   < > 0.99 0.80 0.73  CALCIUM 8.9   < > 8.9 8.7* 9.0  MG 2.5*   < > 3.0* 2.9* 2.6*  PHOS 4.6  --   --   --   --    < > = values in this interval not displayed.   GFR: Estimated Creatinine Clearance: 91.5 mL/min (by C-G  formula based on SCr of 0.73 mg/dL).  Liver Function Tests: Recent Labs  Lab 01/22/20 0328 01/23/20 0233 01/24/20 0238 01/25/20 0244  AST 19 22 33 88*  ALT 21 28 43 112*  ALKPHOS 47 46 47 50  BILITOT 0.9 0.7 0.8 1.0  PROT 5.9* 5.8* 5.6* 5.9*  ALBUMIN 2.7* 2.7* 2.7* 2.9*   HbA1C: Hgb A1c MFr Bld  Date/Time Value Ref Range Status  01/23/2020 02:33 AM 6.4 (H) 4.8 - 5.6 % Final    Comment:    (NOTE) Pre diabetes:          5.7%-6.4%  Diabetes:              >6.4%  Glycemic control for   <7.0% adults with diabetes     CBG: Recent Labs  Lab 01/23/20 2111 01/24/20 0828 01/24/20 1242 01/24/20 1643 01/24/20 2143  GLUCAP 149* 154* 144* 148* 149*    Recent Results (from the past 240 hour(s))  Culture, sputum-assessment     Status: None   Collection Time: 01/20/20 11:45 AM   Specimen: Expectorated Sputum  Result Value Ref Range Status   Specimen Description Expect. Sput  Final   Special Requests NONE  Final   Sputum evaluation   Final    THIS SPECIMEN IS ACCEPTABLE FOR SPUTUM CULTURE Performed at [facility], [address], [place], [place] [address]    Report Status 01/20/2020 FINAL  Final  Culture, respiratory     Status: None   Collection Time: 01/20/20 11:45 AM  Result Value Ref Range Status   Specimen Description   Final    Expect. Sput Performed at [facility], [address] [place], [place] [address]    Special Requests   Final    NONE Reflexed from [phone] Performed at [facility], [address] [place], [place] [address]    Gram Stain   Final    FEW WBC PRESENT,BOTH PMN AND MONONUCLEAR ABUNDANT GRAM POSITIVE COCCI RARE GRAM NEGATIVE RODS RARE YEAST    Culture   Final    ABUNDANT Consistent with normal respiratory flora. No Pseudomonas species isolated Performed at [facility], [address] [place], [place] [address]    Report Status 01/22/2020 FINAL  Final  MRSA PCR  Screening     Status: None   Collection Time: 01/20/20  5:09 PM   Specimen: Nasopharyngeal  Result Value Ref Range Status   MRSA by PCR NEGATIVE NEGATIVE Final    Comment:        The GeneXpert MRSA Assay (FDA approved for NASAL specimens only), is one component of a comprehensive MRSA colonization surveillance program. It is not intended to diagnose MRSA infection nor to guide or monitor treatment for MRSA infections. Performed at [facility], [address] [place], [place] [address]      Scheduled Meds: . baricitinib  4 mg Oral Once  . chlorhexidine  15 mL Mouth Rinse BID  . Chlorhexidine Gluconate Cloth  6 each Topical Daily  . cloNIDine  0.1 mg Transdermal Weekly  . enoxaparin (LOVENOX) injection  60 mg Subcutaneous Q24H  . insulin aspart  0-9 Units Subcutaneous TID WC  . mouth rinse  15 mL Mouth Rinse BID  . mouth rinse  15 mL Mouth Rinse q12n4p  . methylPREDNISolone (SOLU-MEDROL) injection  20 mg Intravenous Q12H  . metoprolol tartrate  5 mg Intravenous Q6H  . nystatin   Topical BID  . QUEtiapine  25 mg Oral QHS   Continuous Infusions: . dextrose 75 mL/hr at 01/25/20 0700     LOS: 11 days   Lonia Blood, MD [facility] Office  [phone] Pager - Text Page per Amion  If 7PM-7AM, please contact night-coverage per Amion 01/25/2020, 7:42 AM

## 2020-01-26 LAB — GLUCOSE, CAPILLARY
Glucose-Capillary: 110 mg/dL — ABNORMAL HIGH (ref 70–99)
Glucose-Capillary: 142 mg/dL — ABNORMAL HIGH (ref 70–99)
Glucose-Capillary: 146 mg/dL — ABNORMAL HIGH (ref 70–99)
Glucose-Capillary: 149 mg/dL — ABNORMAL HIGH (ref 70–99)
Glucose-Capillary: 149 mg/dL — ABNORMAL HIGH (ref 70–99)
Glucose-Capillary: 151 mg/dL — ABNORMAL HIGH (ref 70–99)

## 2020-01-26 LAB — COMPREHENSIVE METABOLIC PANEL
ALT: 127 U/L — ABNORMAL HIGH (ref 0–44)
AST: 61 U/L — ABNORMAL HIGH (ref 15–41)
Albumin: 2.5 g/dL — ABNORMAL LOW (ref 3.5–5.0)
Alkaline Phosphatase: 50 U/L (ref 38–126)
Anion gap: 8 (ref 5–15)
BUN: 31 mg/dL — ABNORMAL HIGH (ref 8–23)
CO2: 35 mmol/L — ABNORMAL HIGH (ref 22–32)
Calcium: 8.5 mg/dL — ABNORMAL LOW (ref 8.9–10.3)
Chloride: 101 mmol/L (ref 98–111)
Creatinine, Ser: 0.56 mg/dL (ref 0.44–1.00)
GFR calc Af Amer: >60 mL/min (ref >60)
GFR calc non Af Amer: >60 mL/min (ref >60)
Glucose, Bld: 165 mg/dL — ABNORMAL HIGH (ref 70–99)
Potassium: 4 mmol/L (ref 3.5–5.1)
Sodium: 144 mmol/L (ref 135–145)
Total Bilirubin: 0.7 mg/dL (ref 0.3–1.2)
Total Protein: 5.1 g/dL — ABNORMAL LOW (ref 6.5–8.1)

## 2020-01-26 LAB — CBC
HCT: 32.5 % — ABNORMAL LOW (ref 36.0–46.0)
Hemoglobin: 10.5 g/dL — ABNORMAL LOW (ref 12.0–15.0)
MCH: 30.7 pg (ref 26.0–34.0)
MCHC: 32.3 g/dL (ref 30.0–36.0)
MCV: 95 fL (ref 80.0–100.0)
Platelets: 278 10*3/uL (ref 150–400)
RBC: 3.42 MIL/uL — ABNORMAL LOW (ref 3.87–5.11)
RDW: 13.6 % (ref 11.5–15.5)
WBC: 19.4 10*3/uL — ABNORMAL HIGH (ref 4.0–10.5)
nRBC: 0 % (ref 0.0–0.2)

## 2020-01-26 MED ORDER — LORAZEPAM 2 MG/ML IJ SOLN
1.0000 mg | INTRAMUSCULAR | Status: DC | PRN
  Administered 2020-01-26 – 2020-01-27 (×5): 1 mg via INTRAVENOUS
  Filled 2020-01-26 (×5): qty 1

## 2020-01-26 MED ORDER — QUETIAPINE 12.5 MG HALF TABLET
12.5000 mg | ORAL_TABLET | Freq: Every day | ORAL | Status: DC
  Filled 2020-01-26 (×2): qty 1

## 2020-01-26 NOTE — Progress Notes (Signed)
Julie Mcguire  ZHY:[id number] DOB: 01/03/1956 DOA: 12/25/2019 PCP: Elias Else, MD    Brief Narrative:  [phone] with a history of recent COVID pneumonia, HTN, and obesity who returned to the ED with worsening shortness of breath. She was discharged from the hospital 8/27 following treatment for Covid pneumonia with steroids, remdesivir, and baricitinib.  At the time of discharge she required 4 L oxygen support. When EMS arrived at her home she was found to be saturating 75% on 6 L of nasal cannula and required 15 L via nonrebreather mask to get saturations up to 85%. In the ED CXR noted moderate to severe bilateral patchy infiltrates and the patient was readmitted to the hospital with refractory Covid pneumonia.  Significant Events:  8/24 initial admission to hospital - steroids, remdesivir, and baricitinib 8/27 discharge home after treatment for Covid pneumonia 8/31 readmit for refractory Covid pneumonia  Date of Positive COVID Test:  01/07/2020  Vaccination Status: NOT vaccinated   COVID-19 specific Treatment: Solu-Medrol 8/24 > 8/27 + 8/31 > Baricitinib 8/24 > 8/27 + 8/31 > 9/9 Remdesivir 8/24 > 8/27 + 8/31 > 9/4  Antimicrobials:  Azithromycin 9/6 > 9/8 Rocephin 9/6 > 9/8  DVT prophylaxis: Lovenox  Subjective: Afebrile.  Continues to require high level O2 support with heated high flow nasal cannula as well as nonrebreather mask. No signif change in overall status today.   Assessment & Plan:  Acute hypoxic respiratory failure -COVID Pneumonia Has completed a 5-day course of remdesivir and a course of baricitinib - Solu-Medrol continues - prone frequently - titrate oxygen as required -utilizing BiPAP as needed in ICU setting - no signs of signif improvement thus far - high risk for further decline/mortality   Recent Labs  Lab 01/20/20 0957 01/20/20 0957 01/21/20 0232 01/21/20 0232 01/22/20 0328 01/23/20 0233 01/24/20 0238 01/25/20 0244 01/26/20 0258  DDIMER 2.68*   --  2.62*  --  2.36* 2.56* 1.97*  --   --   FERRITIN  --   --   --   --   --  595* 584*  --   --   CRP 2.0*  --  3.5*  --  1.9* 0.8 0.6  --   --   ALT 25   < > 23   < > 21 28 43 112* 127*  PROCALCITON <0.10  --  <0.10  --  <0.10  --   --   --   --    < > = values in this interval not displayed.    Toxic metabolic encephalopathy Unable to accomplish CT due to high O2 needs - likely due to sedating meds   Possible secondary bacterial pneumonia procalcitonin not convincing of bacterial pneumonia - discontinued empiric antibiotics after 3 complete days of treatment  Transaminitis Monitor trend - not severely elevated at this time  Recent Labs  Lab 01/22/20 0328 01/23/20 0233 01/24/20 0238 01/25/20 0244 01/26/20 0258  AST 19 22 33 88* 61*  ALT 21 28 43 112* 127*  ALKPHOS 47 46 47 50 50  BILITOT 0.9 0.7 0.8 1.0 0.7  PROT 5.9* 5.8* 5.6* 5.9* 5.1*  ALBUMIN 2.7* 2.7* 2.7* 2.9* 2.5*    Elevated D-dimer Venous duplex negative for DVT - CTa chest negative for PE  Hypernatremia Cont free water via IV until NG can be placed as confusion prohibits signif oral intake at present - NG feeding tube unable to be placed despite attempts 9/11 - Na has normalized w/ IV D5W - follow  Recent Labs  Lab 01/22/20 0328 01/23/20 0233 01/24/20 0238 01/25/20 0244 01/26/20 0258  NA 157* 155* 154* 152* 144    Steroid-induced hyperglycemia No known history of DM - A1c 6.4 - CBG not markedly elevated - wean steroid asap  Hypokalemia  Corrected w/ supplemental KCl  Essential HTN BP not at goal - added clonidine 9/10 - no need for more acute change presently   Morbid obesity - Body mass index is 42.84 kg/m.   Staph epi in 1 of 2 blood cx Most c/w a contaminant    Code Status: FULL CODE Family Communication:  Status is: Inpatient  Remains inpatient appropriate because:IV treatments appropriate due to intensity of illness or inability to take PO and Inpatient level of care appropriate due  to severity of illness   Dispo:  Patient From: Home  Planned Disposition: To be determined  Expected discharge date: 01/25/20  Medically stable for discharge: No   Consultants:  Palliative Care  Objective: Blood pressure (!) 147/79, pulse 81, temperature 98.1 F (36.7 C), resp. rate (!) 23, height 5\' 4"  (1.626 m), weight 113.2 kg, SpO2 93 %.  Intake/Output Summary (Last 24 hours) at 01/26/2020 0815 Last data filed at 01/26/2020 0700 Gross per 24 hour  Intake 1680.13 ml  Output 500 ml  Net 1180.13 ml   Filed Weights   01/19/20 0500 01/20/20 0441 01/26/20 0521  Weight: 115.8 kg 114.3 kg 113.2 kg    Examination: General: Not in extremis - sedate - does not respond to voice or touch  Lungs: Fine crackles diffusely - no wheezing  Cardiovascular: RRR - no M or rub  Abdomen: Nondistended, soft, bowel sounds hypoactive, no mass Extremities: Trace B LE  edema w/o cyanosis  CBC: Recent Labs  Lab 01/24/20 0238 01/25/20 0244 01/26/20 0258  WBC 17.0* 19.7* 19.4*  HGB 12.0 11.8* 10.5*  HCT 39.6 38.1 32.5*  MCV 100.0 96.2 95.0  PLT 299 315 278   Basic Metabolic Panel: Recent Labs  Lab 01/23/20 0233 01/23/20 0233 01/24/20 0238 01/25/20 0244 01/26/20 0258  NA 155*   < > 154* 152* 144  K 3.4*   < > 3.9 4.2 4.0  CL 110   < > 109 103 101  CO2 36*   < > 35* 38* 35*  GLUCOSE 208*   < > 173* 182* 165*  BUN 55*   < > 43* 40* 31*  CREATININE 0.99   < > 0.80 0.73 0.56  CALCIUM 8.9   < > 8.7* 9.0 8.5*  MG 3.0*  --  2.9* 2.6*  --    < > = values in this interval not displayed.   GFR: Estimated Creatinine Clearance: 91 mL/min (by C-G formula based on SCr of 0.56 mg/dL).  Liver Function Tests: Recent Labs  Lab 01/23/20 0233 01/24/20 0238 01/25/20 0244 01/26/20 0258  AST 22 33 88* 61*  ALT 28 43 112* 127*  ALKPHOS 46 47 50 50  BILITOT 0.7 0.8 1.0 0.7  PROT 5.8* 5.6* 5.9* 5.1*  ALBUMIN 2.7* 2.7* 2.9* 2.5*   HbA1C: Hgb A1c MFr Bld  Date/Time Value Ref Range Status    01/23/2020 02:33 AM 6.4 (H) 4.8 - 5.6 % Final    Comment:    (NOTE) Pre diabetes:          5.7%-6.4%  Diabetes:              >6.4%  Glycemic control for   <7.0% adults with diabetes     CBG:  Recent Labs  Lab 01/25/20 1154 01/25/20 1558 01/25/20 2126 01/26/20 0030 01/26/20 0511  GLUCAP 154* 136* 131* 149* 146*    Recent Results (from the past 240 hour(s))  Culture, sputum-assessment     Status: None   Collection Time: 01/20/20 11:45 AM   Specimen: Expectorated Sputum  Result Value Ref Range Status   Specimen Description Expect. Sput  Final   Special Requests NONE  Final   Sputum evaluation   Final    THIS SPECIMEN IS ACCEPTABLE FOR SPUTUM CULTURE Performed at [facility], [address] [place], [place] [address]    Report Status 01/20/2020 FINAL  Final  Culture, respiratory     Status: None   Collection Time: 01/20/20 11:45 AM  Result Value Ref Range Status   Specimen Description   Final    Expect. Sput Performed at [facility], [address], [place], [place] [address]    Special Requests   Final    NONE Reflexed from [id number] Performed at [facility] [address] [place], [place] [address]    Gram Stain   Final    FEW WBC PRESENT,BOTH PMN AND MONONUCLEAR ABUNDANT GRAM POSITIVE COCCI RARE GRAM NEGATIVE RODS RARE YEAST    Culture   Final    ABUNDANT Consistent with normal respiratory flora. No Pseudomonas species isolated Performed at [facility], [address] [place], [place] [address]    Report Status 01/22/2020 FINAL  Final  MRSA PCR Screening     Status: None   Collection Time: 01/20/20  5:09 PM   Specimen: Nasopharyngeal  Result Value Ref Range Status   MRSA by PCR NEGATIVE NEGATIVE Final    Comment:        The GeneXpert MRSA Assay (FDA approved for NASAL specimens only), is one component of a comprehensive MRSA colonization surveillance program. It is not intended to  diagnose MRSA infection nor to guide or monitor treatment for MRSA infections. Performed at [facility], [address], [place], [place] [address]      Scheduled Meds: . chlorhexidine  15 mL Mouth Rinse BID  . Chlorhexidine Gluconate Cloth  6 each Topical Daily  . cloNIDine  0.1 mg Transdermal Weekly  . enoxaparin (LOVENOX) injection  60 mg Subcutaneous Q24H  . free water  200 mL Per Tube Q4H  . insulin aspart  0-9 Units Subcutaneous TID WC  . mouth rinse  15 mL Mouth Rinse BID  . mouth rinse  15 mL Mouth Rinse q12n4p  . methylPREDNISolone (SOLU-MEDROL) injection  20 mg Intravenous Q12H  . metoprolol tartrate  5 mg Intravenous Q6H  . nystatin   Topical BID  . QUEtiapine  25 mg Oral QHS   Continuous Infusions: . dextrose 75 mL/hr at 01/26/20 0700  . feeding supplement (OSMOLITE 1.5 CAL)       LOS: 12 days   Lonia Blood, MD [facility] Office  [phone] Pager - Text Page per Amion  If 7PM-7AM, please contact night-coverage per Amion 01/26/2020, 8:15 AM

## 2020-01-27 ENCOUNTER — Inpatient Hospital Stay (HOSPITAL_COMMUNITY): Payer: BC Managed Care – PPO

## 2020-01-27 ENCOUNTER — Encounter (HOSPITAL_COMMUNITY): Payer: Self-pay | Admitting: Internal Medicine

## 2020-01-27 LAB — COMPREHENSIVE METABOLIC PANEL
ALT: 149 U/L — ABNORMAL HIGH (ref 0–44)
AST: 53 U/L — ABNORMAL HIGH (ref 15–41)
Albumin: 2.7 g/dL — ABNORMAL LOW (ref 3.5–5.0)
Alkaline Phosphatase: 59 U/L (ref 38–126)
Anion gap: 9 (ref 5–15)
BUN: 24 mg/dL — ABNORMAL HIGH (ref 8–23)
CO2: 32 mmol/L (ref 22–32)
Calcium: 8.6 mg/dL — ABNORMAL LOW (ref 8.9–10.3)
Chloride: 100 mmol/L (ref 98–111)
Creatinine, Ser: 0.58 mg/dL (ref 0.44–1.00)
GFR calc Af Amer: >60 mL/min (ref >60)
GFR calc non Af Amer: >60 mL/min (ref >60)
Glucose, Bld: 171 mg/dL — ABNORMAL HIGH (ref 70–99)
Potassium: 4.7 mmol/L (ref 3.5–5.1)
Sodium: 141 mmol/L (ref 135–145)
Total Bilirubin: 0.8 mg/dL (ref 0.3–1.2)
Total Protein: 5.6 g/dL — ABNORMAL LOW (ref 6.5–8.1)

## 2020-01-27 LAB — CBC
HCT: 35.6 % — ABNORMAL LOW (ref 36.0–46.0)
Hemoglobin: 11.7 g/dL — ABNORMAL LOW (ref 12.0–15.0)
MCH: 30.8 pg (ref 26.0–34.0)
MCHC: 32.9 g/dL (ref 30.0–36.0)
MCV: 93.7 fL (ref 80.0–100.0)
Platelets: 264 10*3/uL (ref 150–400)
RBC: 3.8 MIL/uL — ABNORMAL LOW (ref 3.87–5.11)
RDW: 13.4 % (ref 11.5–15.5)
WBC: 24.6 10*3/uL — ABNORMAL HIGH (ref 4.0–10.5)
nRBC: 0 % (ref 0.0–0.2)

## 2020-01-27 LAB — MAGNESIUM: Magnesium: 2.3 mg/dL (ref 1.7–2.4)

## 2020-01-27 LAB — GLUCOSE, CAPILLARY
Glucose-Capillary: 153 mg/dL — ABNORMAL HIGH (ref 70–99)
Glucose-Capillary: 156 mg/dL — ABNORMAL HIGH (ref 70–99)
Glucose-Capillary: 167 mg/dL — ABNORMAL HIGH (ref 70–99)
Glucose-Capillary: 127 mg/dL — ABNORMAL HIGH (ref 70–99)

## 2020-01-27 MED ORDER — LORAZEPAM 2 MG/ML IJ SOLN
2.0000 mg | Freq: Once | INTRAMUSCULAR | Status: AC
  Administered 2020-01-27: 2 mg via INTRAVENOUS
  Filled 2020-01-27: qty 1

## 2020-01-27 MED ORDER — LORAZEPAM 2 MG/ML IJ SOLN
1.0000 mg | INTRAMUSCULAR | Status: DC | PRN
  Administered 2020-01-27 – 2020-01-28 (×2): 2 mg via INTRAVENOUS
  Filled 2020-01-27 (×3): qty 1

## 2020-01-27 MED ORDER — LORAZEPAM 2 MG/ML IJ SOLN: 1.0000 mg | Freq: Once | INTRAMUSCULAR | Status: DC

## 2020-01-27 NOTE — Progress Notes (Signed)
PT Cancellation Note  Patient Details Name: Julie Mcguire MRN: [id number] DOB: 08/16/1955   Cancelled Treatment:    Reason Eval/Treat Not Completed: Medical issues which prohibited therapy, remains on restraints, possible TF placed today. Will follow for patient's ability to participate in PT.   Sharen Heck PT Acute Rehabilitation Services Pager [phone] Office [phone]  01/27/2020, 7:29 AM

## 2020-01-27 NOTE — Progress Notes (Signed)
Julie Mcguire  LGX:[id number] DOB: [birth year] DOA: 01-22-20 PCP: Elias Else, MD    Brief Narrative:  [id number] with a history of recent COVID pneumonia, HTN, and obesity who returned to the ED with worsening shortness of breath. She was discharged from the hospital 8/27 following treatment for Covid pneumonia with steroids, remdesivir, and baricitinib.  At the time of discharge she required 4 L oxygen support. When EMS arrived at her home she was found to be saturating 75% on 6 L of nasal cannula and required 15 L via nonrebreather mask to get saturations up to 85%. In the ED CXR noted moderate to severe bilateral patchy infiltrates and the patient was readmitted to the hospital with refractory Covid pneumonia.  Significant Events:  8/24 initial admission to hospital - steroids, remdesivir, and baricitinib 8/27 discharge home after treatment for Covid pneumonia 8/31 readmit for refractory Covid pneumonia  Date of Positive COVID Test:  01/07/2020  Vaccination Status: NOT vaccinated   COVID-19 specific Treatment: Solu-Medrol 8/24 > 8/27 + 8/31 > Baricitinib 8/24 > 8/27 + 8/31 > 9/9 Remdesivir 8/24 > 8/27 + 8/31 > 9/4  Antimicrobials:  Azithromycin 9/6 > 9/8 Rocephin 9/6 > 9/8  DVT prophylaxis: Lovenox  Subjective: Requiring heated high flow nasal cannula at 80% FiO2 and 30 L of flow.  Afebrile.  Vital signs otherwise stable.  Overall no significant improvement is being seen.  Her mental status remains significantly altered with her having times of severe agitation and combativeness.  Assessment & Plan:  Acute hypoxic respiratory failure - COVID Pneumonia Has completed a 5-day course of remdesivir and a course of baricitinib - Solu-Medrol continues on a slow wean - titrate oxygen as required - no sign of signif improvement thus far - high risk for further decline/mortality   Toxic metabolic encephalopathy CT head without acute findings- likely due to sedating meds and acute  delirium of prolonged critical illness - wean sedatives as able but recurring agitation and combativeness making this difficult  Possible secondary bacterial pneumonia procalcitonin not convincing of bacterial pneumonia - discontinued empiric antibiotics after 3 complete days of treatment  Transaminitis Monitor trend - not severely elevated at this time  Recent Labs  Lab 01/23/20 0233 01/24/20 0238 01/25/20 0244 01/26/20 0258 01/27/20 0315  AST 22 33 88* 61* 53*  ALT 28 43 112* 127* 149*  ALKPHOS 46 47 50 50 59  BILITOT 0.7 0.8 1.0 0.7 0.8  PROT 5.8* 5.6* 5.9* 5.1* 5.6*  ALBUMIN 2.7* 2.7* 2.9* 2.5* 2.7*    Elevated D-dimer Venous duplex negative for DVT - CTa chest negative for PE  Hypernatremia Slow free water as Na now at goal - NG feeding tube unable to be placed despite attempts 9/11 - Na has normalized  Recent Labs  Lab 01/23/20 0233 01/24/20 0238 01/25/20 0244 01/26/20 0258 01/27/20 0315  NA 155* 154* 152* 144 141    Steroid-induced hyperglycemia No known history of DM - A1c 6.4 - CBG not markedly elevated - weaning steroid   Hypokalemia  Corrected w/ supplemental KCl  Essential HTN BP reasonably controlled   Morbid obesity - Body mass index is 42.27 kg/m.   Staph epi in 1 of 2 blood cx Most c/w a contaminant    Code Status: FULL CODE Family Communication:  Status is: Inpatient  Remains inpatient appropriate because:IV treatments appropriate due to intensity of illness or inability to take PO and Inpatient level of care appropriate due to severity of illness   Dispo:  Patient  From: Home  Planned Disposition: To be determined  Expected discharge date: 01/31/20  Medically stable for discharge: No   Consultants:  Palliative Care  Objective: Blood pressure (!) 152/70, pulse 65, temperature 98.4 F (36.9 C), temperature source Axillary, resp. rate 15, height 5\' 4"  (1.626 m), weight 111.7 kg, SpO2 96 %.  Intake/Output Summary (Last 24 hours)  at 01/27/2020 0715 Last data filed at 01/27/2020 01/29/2020 Gross per 24 hour  Intake 1731.77 ml  Output 1400 ml  Net 331.77 ml   Filed Weights   01/20/20 0441 01/26/20 0521 01/27/20 0500  Weight: 114.3 kg 113.2 kg 111.7 kg    Examination: General: Not in extremis - sedate -will grimace with painful stimuli Lungs: Fine crackles diffusely - no wheezing  Cardiovascular: RRR - no M or rub  Abdomen: Nondistended, soft, bowel sounds hypoactive, no mass Extremities: Trace B LE edema -no cyanosis  CBC: Recent Labs  Lab 01/25/20 0244 01/26/20 0258 01/27/20 0315  WBC 19.7* 19.4* 24.6*  HGB 11.8* 10.5* 11.7*  HCT 38.1 32.5* 35.6*  MCV 96.2 95.0 93.7  PLT 315 278 264   Basic Metabolic Panel: Recent Labs  Lab 01/24/20 0238 01/24/20 0238 01/25/20 0244 01/26/20 0258 01/27/20 0315  NA 154*   < > 152* 144 141  K 3.9   < > 4.2 4.0 4.7  CL 109   < > 103 101 100  CO2 35*   < > 38* 35* 32  GLUCOSE 173*   < > 182* 165* 171*  BUN 43*   < > 40* 31* 24*  CREATININE 0.80   < > 0.73 0.56 0.58  CALCIUM 8.7*   < > 9.0 8.5* 8.6*  MG 2.9*  --  2.6*  --  2.3   < > = values in this interval not displayed.   GFR: Estimated Creatinine Clearance: 90.3 mL/min (by C-G formula based on SCr of 0.58 mg/dL).  Liver Function Tests: Recent Labs  Lab 01/24/20 0238 01/25/20 0244 01/26/20 0258 01/27/20 0315  AST 33 88* 61* 53*  ALT 43 112* 127* 149*  ALKPHOS 47 50 50 59  BILITOT 0.8 1.0 0.7 0.8  PROT 5.6* 5.9* 5.1* 5.6*  ALBUMIN 2.7* 2.9* 2.5* 2.7*   HbA1C: Hgb A1c MFr Bld  Date/Time Value Ref Range Status  01/23/2020 02:33 AM 6.4 (H) 4.8 - 5.6 % Final    Comment:    (NOTE) Pre diabetes:          5.7%-6.4%  Diabetes:              >6.4%  Glycemic control for   <7.0% adults with diabetes     CBG: Recent Labs  Lab 01/26/20 0511 01/26/20 0812 01/26/20 1114 01/26/20 1800 01/26/20 2147  GLUCAP 146* 149* 151* 110* 142*    Recent Results (from the past 240 hour(s))  Culture,  sputum-assessment     Status: None   Collection Time: 01/20/20 11:45 AM   Specimen: Expectorated Sputum  Result Value Ref Range Status   Specimen Description Expect. Sput  Final   Special Requests NONE  Final   Sputum evaluation   Final    THIS SPECIMEN IS ACCEPTABLE FOR SPUTUM CULTURE Performed at [facility], [address] [place], [place]    Report Status 01/20/2020 FINAL  Final  Culture, respiratory     Status: None   Collection Time: 01/20/20 11:45 AM  Result Value Ref Range Status   Specimen Description   Final  Expect. Sput Performed at [facility], [address], [place], [place] [address]    Special Requests   Final    NONE Reflexed from [id number] Performed at [facility], [address], [place], [place] [address]    Gram Stain   Final    FEW WBC PRESENT,BOTH PMN AND MONONUCLEAR ABUNDANT GRAM POSITIVE COCCI RARE GRAM NEGATIVE RODS RARE YEAST    Culture   Final    ABUNDANT Consistent with normal respiratory flora. No Pseudomonas species isolated Performed at [facility], [address], [place], [place] [address]    Report Status 01/22/2020 FINAL  Final  MRSA PCR Screening     Status: None   Collection Time: 01/20/20  5:09 PM   Specimen: Nasopharyngeal  Result Value Ref Range Status   MRSA by PCR NEGATIVE NEGATIVE Final    Comment:        The GeneXpert MRSA Assay (FDA approved for NASAL specimens only), is one component of a comprehensive MRSA colonization surveillance program. It is not intended to diagnose MRSA infection nor to guide or monitor treatment for MRSA infections. Performed at [facility], [address], [place], [place] [address]      Scheduled Meds:  chlorhexidine  15 mL Mouth Rinse BID   Chlorhexidine Gluconate Cloth  6 each Topical Daily   cloNIDine  0.1 mg Transdermal Weekly   enoxaparin (LOVENOX) injection  60 mg Subcutaneous Q24H    free water  200 mL Per Tube Q4H   insulin aspart  0-9 Units Subcutaneous TID WC   mouth rinse  15 mL Mouth Rinse BID   methylPREDNISolone (SOLU-MEDROL) injection  20 mg Intravenous Q12H   metoprolol tartrate  5 mg Intravenous Q6H   nystatin   Topical BID   QUEtiapine  12.5 mg Oral QHS   Continuous Infusions:  dextrose 75 mL/hr at 01/27/20 0619   feeding supplement (OSMOLITE 1.5 CAL)       LOS: 13 days   Lonia Blood, MD [facility] Office  [phone] Pager - Text Page per Loretha Stapler  If 7PM-7AM, please contact night-coverage per Amion 01/27/2020, 7:15 AM

## 2020-01-27 NOTE — Progress Notes (Signed)
OT Cancellation Note  Patient Details Name: Julie Mcguire MRN: [id number] DOB: 25-Dec-1955   Cancelled Treatment:    Reason Eval/Treat Not Completed: Medical issues which prohibited therapy, remains on restraints, possible TF placed today. Will follow for patient's ability to safely participate in OT. Information extracted from PT note this morning.   Theodoro Clock 01/27/2020, 8:28 AM

## 2020-01-28 ENCOUNTER — Encounter (HOSPITAL_COMMUNITY): Payer: Self-pay | Admitting: Internal Medicine

## 2020-01-28 LAB — BLOOD GAS, ARTERIAL
Acid-Base Excess: 7.1 mmol/L — ABNORMAL HIGH (ref 0.0–2.0)
Bicarbonate: 32 mmol/L — ABNORMAL HIGH (ref 20.0–28.0)
Drawn by: 29503
FIO2: 100
O2 Saturation: 91.6 %
Patient temperature: 98.6
pCO2 arterial: 48.7 mmHg — ABNORMAL HIGH (ref 32.0–48.0)
pH, Arterial: 7.433 (ref 7.350–7.450)
pO2, Arterial: 64.9 mmHg — ABNORMAL LOW (ref 83.0–108.0)

## 2020-01-28 LAB — GLUCOSE, CAPILLARY
Glucose-Capillary: 142 mg/dL — ABNORMAL HIGH (ref 70–99)
Glucose-Capillary: 174 mg/dL — ABNORMAL HIGH (ref 70–99)
Glucose-Capillary: 137 mg/dL — ABNORMAL HIGH (ref 70–99)
Glucose-Capillary: 113 mg/dL — ABNORMAL HIGH (ref 70–99)

## 2020-01-28 MED ORDER — METHYLPREDNISOLONE SODIUM SUCC 40 MG IJ SOLR
20.0000 mg | INTRAMUSCULAR | Status: DC
  Administered 2020-01-29 – 2020-02-03 (×6): 20 mg via INTRAVENOUS
  Filled 2020-01-28 (×6): qty 1

## 2020-01-28 MED ORDER — DEXMEDETOMIDINE HCL IN NACL 200 MCG/50ML IV SOLN
0.0000 ug/kg/h | INTRAVENOUS | Status: AC
  Administered 2020-01-28 (×3): 0.4 ug/kg/h via INTRAVENOUS
  Administered 2020-01-29: 0.2 ug/kg/h via INTRAVENOUS
  Administered 2020-01-29 – 2020-01-31 (×9): 0.4 ug/kg/h via INTRAVENOUS
  Filled 2020-01-28 (×15): qty 50

## 2020-01-28 MED ORDER — FUROSEMIDE 10 MG/ML IJ SOLN
40.0000 mg | Freq: Once | INTRAMUSCULAR | Status: AC
  Administered 2020-01-28: 40 mg via INTRAVENOUS
  Filled 2020-01-28: qty 4

## 2020-01-28 MED ORDER — LORAZEPAM 2 MG/ML IJ SOLN
2.0000 mg | Freq: Once | INTRAMUSCULAR | Status: AC
  Administered 2020-01-28: 2 mg via INTRAVENOUS

## 2020-01-28 NOTE — Progress Notes (Signed)
Per radiology, fluoroscope room down at this time; plan for tube placement tomorrow. CCM notified by this RN.

## 2020-01-28 NOTE — Consult Note (Signed)
NAME:  Julie Mcguire, MRN:  [id number], DOB:  09-13-1955, LOS: 14 ADMISSION DATE:  01/25/20, CONSULTATION DATE:  01/28/20 REFERRING MD:  Dr. Sharon Seller, CHIEF COMPLAINT:     Brief History   64 y/o F admitted with acute hypoxemic respiratory failure in the setting of COVID PNA   History of present illness   64 y/o F, non-vaccinated,  who was recently admitted from 8/24-8/27 with COVID PNA who returned to the ER on 8/31 with reports of worsening shortness of breath and hypoxemia.   The patient was treated during the last admission with steroids, remdesivir and baricitinib.  At time of discharge she required 4L oxygen support.  On EMS arrival, she was found to have saturations of 75% on 6L and was escalated to NRB with improvement of saturations to 85%.  Initial CXR in the ER demonstrated bilateral infiltrates.  She was admitted to the hospital per [facility] for further care.  The patient was treated with IV steroids. D-dimer elevated, CT angio chest and lower extremity duplex negative. Strep antigen and urine legionella negative.  She had intermittent encephalopathy and was treated with IV ativan.  She remained on NRB.  PCCM consulted 9/14 for evaluation of encephalopathy and hypoxic respiratory failure  Past Medical History  HTN Obesity   Significant Hospital Events   8/24-8/27 Admit with COVID 8/31 Admit with worsening shortness of breath 9/14 PCCM consulted for evaluation of encephalopathy and hypoxic respiratory failure  Consults:    Procedures:     Significant Diagnostic Tests:   CTA Chest 9/2 >> negative for acute PE to the segmental level, findings consistent with known COVID infection and superimposed pulmonary edema   Venous Duplex BLE 9/2 >> negative bilaterally   Micro Data:  COVID 8/24 >> negative  Strep Antigen 9/6 >> negative  U. Legionella 9/6 >> negative   Antimicrobials:  Ceftriaxone 9/6 >> 9/8  Azithromycin 9/6 >> 9/8   Interim history/subjective:  RN reports pt  was agitated trying to get out of bed early this am, given additional dose of ativan without response RT reports pt on HFNC but was transitioned to NRB in setting of pt pulling mask off / mouth breathing  Precedex initiated  Afebrile   Objective   Blood pressure (!) 166/91, pulse 96, temperature 98.2 F (36.8 C), temperature source Axillary, resp. rate 20, height 5\' 4"  (1.626 m), weight 111.1 kg, SpO2 93 %.    FiO2 (%):  [100 %] 100 %   Intake/Output Summary (Last 24 hours) at 01/28/2020 0848 Last data filed at 01/28/2020 0600 Gross per 24 hour  Intake 1706.76 ml  Output 1000 ml  Net 706.76 ml   Filed Weights   01/26/20 0521 01/27/20 0500 01/28/20 0500  Weight: 113.2 kg 111.7 kg 111.1 kg    Examination: General: adult female lying in bed, ill appearing   HEENT: MM pink/moist, NRB mask in place, pupils 71mm reactive  Neuro: sedate CV: s1s2 rrr, no m/r/g PULM: non-labored, mild abdominal accessory muscle use, lungs diminished bilaterally, periods of questionable sleep apnea while at rest  GI: soft, bsx4 active  Extremities: warm/dry, 1-2+ BLE pitting edema  Skin: no rashes or lesions  Resolved Hospital Problem list      Assessment & Plan:   ARDS / Acute Hypoxic Respiratory Failure in setting of COVID PNA Treated with baricitinib, remdesivir & steroids during first admit.  Now on steroids.  Initially treated with IV abx for 3 days but PCT not convincing for acute bacterial component.  -  abg reviewed, return to heated high flow  -wean O2 for sats >85% -threshold for intubation would be change in mental status or increase in respiratory fatigue -follow intermittent CXR  -encourage prone positioning  -wean steroids as able, day 13 -lasix 40 mg IV x1 -precedex as below   Acute Metabolic Encephalopathy  Suspect multifactorial in setting of hypoxia, steroids and medications -supportive care -stop ativan  -continue precedex at lower rates / avoid oversedation  -RASS goal 0 to  -1   Transaminitis  -per primary   Elevated D-Dimer  Venous duplex negative for DVT, CTA chest negative for PE -monitor  Anemia -trend CBC   Hypernatremia  -per primary -continue D5w at 71ml/hr, consider reduction in am   Steroid Induced Hyperglycemia  -SSI  At Risk Malnutrition  -may need cortrak placement to supplement nutrition    Best practice:  Diet: NPO Pain/Anxiety/Delirium protocol (if indicated): precedex  VAP protocol (if indicated): n/a DVT prophylaxis: per primary  GI prophylaxis: n/a  Glucose control: SSI  Mobility: As tolerated  Code Status: Full code  Family Communication: per primary  Disposition: SDU  Labs   CBC: Recent Labs  Lab 01/23/20 0233 01/24/20 0238 01/25/20 0244 01/26/20 0258 01/27/20 0315  WBC 13.2* 17.0* 19.7* 19.4* 24.6*  HGB 12.1 12.0 11.8* 10.5* 11.7*  HCT 39.3 39.6 38.1 32.5* 35.6*  MCV 98.7 100.0 96.2 95.0 93.7  PLT 291 299 315 278 264    Basic Metabolic Panel: Recent Labs  Lab 01/22/20 0328 01/22/20 0328 01/23/20 0233 01/24/20 0238 01/25/20 0244 01/26/20 0258 01/27/20 0315  NA 157*   < > 155* 154* 152* 144 141  K 4.3   < > 3.4* 3.9 4.2 4.0 4.7  CL 106   < > 110 109 103 101 100  CO2 36*   < > 36* 35* 38* 35* 32  GLUCOSE 186*   < > 208* 173* 182* 165* 171*  BUN 55*   < > 55* 43* 40* 31* 24*  CREATININE 1.03*   < > 0.99 0.80 0.73 0.56 0.58  CALCIUM 9.3   < > 8.9 8.7* 9.0 8.5* 8.6*  MG 3.0*  --  3.0* 2.9* 2.6*  --  2.3   < > = values in this interval not displayed.   GFR: Estimated Creatinine Clearance: 90.1 mL/min (by C-G formula based on SCr of 0.58 mg/dL). Recent Labs  Lab 01/22/20 0328 01/23/20 0233 01/24/20 0238 01/25/20 0244 01/26/20 0258 01/27/20 0315  PROCALCITON <0.10  --   --   --   --   --   WBC 14.5*   < > 17.0* 19.7* 19.4* 24.6*   < > = values in this interval not displayed.    Liver Function Tests: Recent Labs  Lab 01/23/20 0233 01/24/20 0238 01/25/20 0244 01/26/20 0258  01/27/20 0315  AST 22 33 88* 61* 53*  ALT 28 43 112* 127* 149*  ALKPHOS 46 47 50 50 59  BILITOT 0.7 0.8 1.0 0.7 0.8  PROT 5.8* 5.6* 5.9* 5.1* 5.6*  ALBUMIN 2.7* 2.7* 2.9* 2.5* 2.7*   No results for input(s): LIPASE, AMYLASE in the last 168 hours. No results for input(s): AMMONIA in the last 168 hours.  ABG No results found for: PHART, PCO2ART, PO2ART, HCO3, TCO2, ACIDBASEDEF, O2SAT   Coagulation Profile: No results for input(s): INR, PROTIME in the last 168 hours.  Cardiac Enzymes: No results for input(s): CKTOTAL, CKMB, CKMBINDEX, TROPONINI in the last 168 hours.  HbA1C: Hgb A1c MFr Bld  Date/Time  Value Ref Range Status  01/23/2020 02:33 AM 6.4 (H) 4.8 - 5.6 % Final    Comment:    (NOTE) Pre diabetes:          5.7%-6.4%  Diabetes:              >6.4%  Glycemic control for   <7.0% adults with diabetes     CBG: Recent Labs  Lab 01/27/20 0752 01/27/20 1306 01/27/20 1610 01/27/20 2155 01/28/20 0741  GLUCAP 153* 156* 167* 127* 142*    Review of Systems:   Unable to complete as patient is sedate.  Past Medical History  She,  has a past medical history of Hypertension.   Surgical History    Past Surgical History:  Procedure Laterality Date  . ABDOMINAL HYSTERECTOMY    . APPENDECTOMY    . CHOLECYSTECTOMY    . TONSILLECTOMY    . TOTAL HIP ARTHROPLASTY Left 09/22/2017   Procedure: LEFT TOTAL HIP ARTHROPLASTY ANTERIOR APPROACH;  Surgeon: Kathryne Hitch, MD;  Location: [facility];  Service: Orthopedics;  Laterality: Left;     Social History   reports that she quit smoking about 24 years ago. She has a 15.00 pack-year smoking history. She has never used smokeless tobacco. She reports current alcohol use. She reports that she does not use drugs.   Family History   Her family history is not on file.   Allergies Allergies  Allergen Reactions  . Codeine Nausea And Vomiting     Home Medications  Prior to Admission medications   Medication Sig Start  Date End Date Taking? Authorizing Provider  acetaminophen (TYLENOL) 500 MG tablet Take 1,000 mg by mouth every 4 (four) hours as needed for moderate pain or headache.    Yes [provider]  amLODipine (NORVASC) 10 MG tablet Take 10 mg by mouth daily. 01/07/20  Yes [provider]  metoprolol succinate (TOPROL-XL) 100 MG 24 hr tablet Take 100 mg by mouth daily. 08/17/17  Yes [provider]  oxybutynin (DITROPAN) 5 MG tablet Take 5 mg by mouth 2 (two) times daily. 01/06/20  Yes [provider]  valsartan-hydrochlorothiazide (DIOVAN-HCT) 320-25 MG tablet Take 1 tablet by mouth daily. 01/07/20  Yes [provider]  methocarbamol (ROBAXIN) 500 MG tablet Take 1 tablet (500 mg total) by mouth every 6 (six) hours as needed for muscle spasms. Patient not taking: Reported on 01/07/2020 09/23/17   Kathryne Hitch, MD     Critical care time: 73 minutes     Canary Brim, MSN, NP-C [facility] 01/28/2020, 8:48 AM   Please see [email or web address] for pager details.

## 2020-01-28 NOTE — Progress Notes (Signed)
PT Cancellation Note  Patient Details Name: BRENYA TAULBEE MRN: [id number] DOB: 19-Jul-1955   Cancelled Treatment:     per chart review and RN discussion, will need to hold off PHYSICAL THERAPY today.  Pt has been evaluated with rec for SNF.  Will attempt to see another day.     Armando Reichert 01/28/2020, 12:43 PM

## 2020-01-28 NOTE — Progress Notes (Signed)
Julie Mcguire  DZH:[id number] DOB: February 23, 1956 DOA: Feb 05, 2020 PCP: Elias Else, MD    Brief Narrative:  [id number] with a history of recent COVID pneumonia, HTN, and obesity who returned to the ED with worsening shortness of breath. She was discharged from the hospital 8/27 following treatment for Covid pneumonia with steroids, remdesivir, and baricitinib.  At the time of discharge she required 4 L oxygen support. When EMS arrived at her home she was found to be saturating 75% on 6 L of nasal cannula and required 15 L via nonrebreather mask to get saturations up to 85%. In the ED CXR noted moderate to severe bilateral patchy infiltrates and the patient was readmitted to the hospital with refractory Covid pneumonia.  Significant Events:  8/24 initial admission to hospital - steroids, remdesivir, and baricitinib 8/27 discharge home after treatment for Covid pneumonia 8/31 readmit for refractory Covid pneumonia  Date of Positive COVID Test:  01/07/2020  Vaccination Status: NOT vaccinated   COVID-19 specific Treatment: Solu-Medrol 8/24 > 8/27 + 8/31 > Baricitinib 8/24 > 8/27 + 8/31 > 9/9 Remdesivir 8/24 > 8/27 + 8/31 > 9/4  Antimicrobials:  Azithromycin 9/6 > 9/8 Rocephin 9/6 > 9/8  DVT prophylaxis: Lovenox  Subjective: The patient's RN has encountered increasing frequency of agitation this morning making it difficult to manage her oxygen support apparatus.  The patient is not making any progress in regards to waking up and being interactive.  I have asked PCCM to see the patient in consultation to consider Precedex as a means of sedation and to evaluate her respiratory status.    At the time of my exam Precedex has been initiated.  She has heavily sedate.  She will still wake up to a sternal rub.  Respirations are currently stable but she does have episodes of increased work of breathing.  Assessment & Plan:  Acute hypoxic respiratory failure - COVID Pneumonia Has completed a 5-day  course of remdesivir and a course of baricitinib - Solu-Medrol continues on a slow wean - titrate oxygen as able - no sign of signif improvement thus far - high risk for further decline/mortality   Toxic metabolic encephalopathy CT head without acute findings - likely due to sedating meds and acute delirium of prolonged critical illness - recurring agitation and combativeness making weaning of sedation difficult -PCCM consulted to consider Precedex which hopefully will allow for a slow wean to off and facilitate a more controlled waking of the patient  Nutrition  Confusion and agitation have prohibited consistent dietary intake - attempts to place Panda feeding tube x2 at bedside have failed - will ask if Radiology can assist in placing a temporary feeding tube (discussed w/ Husband)  Possible secondary bacterial pneumonia procalcitonin not convincing of bacterial pneumonia - discontinued empiric antibiotics after 3 complete days of treatment  Transaminitis Monitor trend - not severely elevated at this time  Recent Labs  Lab 01/23/20 0233 01/24/20 0238 01/25/20 0244 01/26/20 0258 01/27/20 0315  AST 22 33 88* 61* 53*  ALT 28 43 112* 127* 149*  ALKPHOS 46 47 50 50 59  BILITOT 0.7 0.8 1.0 0.7 0.8  PROT 5.8* 5.6* 5.9* 5.1* 5.6*  ALBUMIN 2.7* 2.7* 2.9* 2.5* 2.7*    Elevated D-dimer Venous duplex negative for DVT - CTa chest negative for PE  Hypernatremia NG feeding tube unable to be placed despite attempts 9/11 - Na has normalized w/ free water via IV - follow   Recent Labs  Lab 01/23/20 0233 01/24/20 0238  01/25/20 0244 01/26/20 0258 01/27/20 0315  NA 155* 154* 152* 144 141    Steroid-induced hyperglycemia No known history of DM - A1c 6.4 - CBG not markedly elevated - weaning steroid   Hypokalemia  Corrected w/ supplemental KCl - f/u in AM   Essential HTN BP reasonably controlled   Morbid obesity - Body mass index is 42.04 kg/m.   Staph epi in 1 of 2 blood cx Most  c/w a contaminant    Code Status: FULL CODE Family Communication:  Status is: Inpatient  Remains inpatient appropriate because:IV treatments appropriate due to intensity of illness or inability to take PO and Inpatient level of care appropriate due to severity of illness   Dispo:  Patient From: Home  Planned Disposition: To be determined  Expected discharge date: 01/31/20  Medically stable for discharge: No   Consultants:  Palliative Care  Objective: Blood pressure (!) 162/100, pulse 83, temperature 98.2 F (36.8 C), temperature source Axillary, resp. rate (!) 25, height 5\' 4"  (1.626 m), weight 111.1 kg, SpO2 90 %.  Intake/Output Summary (Last 24 hours) at 01/28/2020 0724 Last data filed at 01/28/2020 0600 Gross per 24 hour  Intake 1706.76 ml  Output 1000 ml  Net 706.76 ml   Filed Weights   01/26/20 0521 01/27/20 0500 01/28/20 0500  Weight: 113.2 kg 111.7 kg 111.1 kg    Examination: General: Not in extremis - sedate but also intermittently agitated  Lungs: Fine crackles diffusely w/o wheezing  Cardiovascular: RRR w/o M or rub  Abdomen: Nondistended, soft, bowel sounds hypoactive, no mass Extremities: no signif B LE edema -no cyanosis  CBC: Recent Labs  Lab 01/25/20 0244 01/26/20 0258 01/27/20 0315  WBC 19.7* 19.4* 24.6*  HGB 11.8* 10.5* 11.7*  HCT 38.1 32.5* 35.6*  MCV 96.2 95.0 93.7  PLT 315 278 264   Basic Metabolic Panel: Recent Labs  Lab 01/24/20 0238 01/24/20 0238 01/25/20 0244 01/26/20 0258 01/27/20 0315  NA 154*   < > 152* 144 141  K 3.9   < > 4.2 4.0 4.7  CL 109   < > 103 101 100  CO2 35*   < > 38* 35* 32  GLUCOSE 173*   < > 182* 165* 171*  BUN 43*   < > 40* 31* 24*  CREATININE 0.80   < > 0.73 0.56 0.58  CALCIUM 8.7*   < > 9.0 8.5* 8.6*  MG 2.9*  --  2.6*  --  2.3   < > = values in this interval not displayed.   GFR: Estimated Creatinine Clearance: 90.1 mL/min (by C-G formula based on SCr of 0.58 mg/dL).  Liver Function Tests: Recent  Labs  Lab 01/24/20 0238 01/25/20 0244 01/26/20 0258 01/27/20 0315  AST 33 88* 61* 53*  ALT 43 112* 127* 149*  ALKPHOS 47 50 50 59  BILITOT 0.8 1.0 0.7 0.8  PROT 5.6* 5.9* 5.1* 5.6*  ALBUMIN 2.7* 2.9* 2.5* 2.7*   HbA1C: Hgb A1c MFr Bld  Date/Time Value Ref Range Status  01/23/2020 02:33 AM 6.4 (H) 4.8 - 5.6 % Final    Comment:    (NOTE) Pre diabetes:          5.7%-6.4%  Diabetes:              >6.4%  Glycemic control for   <7.0% adults with diabetes     CBG: Recent Labs  Lab 01/26/20 2147 01/27/20 0752 01/27/20 1306 01/27/20 1610 01/27/20 2155  GLUCAP 142* 153* 156* 167*  127*    Recent Results (from the past 240 hour(s))  Culture, sputum-assessment     Status: None   Collection Time: 01/20/20 11:45 AM   Specimen: Expectorated Sputum  Result Value Ref Range Status   Specimen Description Expect. Sput  Final   Special Requests NONE  Final   Sputum evaluation   Final    THIS SPECIMEN IS ACCEPTABLE FOR SPUTUM CULTURE Performed at [facility], [address], [place], [place] [address]    Report Status 01/20/2020 FINAL  Final  Culture, respiratory     Status: None   Collection Time: 01/20/20 11:45 AM  Result Value Ref Range Status   Specimen Description   Final    Expect. Sput Performed at [facility], [address], [place], [place] [address]    Special Requests   Final    NONE Reflexed from [id number] Performed at [facility], [address], [place], [place] [address]    Gram Stain   Final    FEW WBC PRESENT,BOTH PMN AND MONONUCLEAR ABUNDANT GRAM POSITIVE COCCI RARE GRAM NEGATIVE RODS RARE YEAST    Culture   Final    ABUNDANT Consistent with normal respiratory flora. No Pseudomonas species isolated Performed at [facility], [address], [place], [place] [address]    Report Status 01/22/2020 FINAL  Final  MRSA PCR Screening     Status: None   Collection Time: 01/20/20  5:09 PM    Specimen: Nasopharyngeal  Result Value Ref Range Status   MRSA by PCR NEGATIVE NEGATIVE Final    Comment:        The GeneXpert MRSA Assay (FDA approved for NASAL specimens only), is one component of a comprehensive MRSA colonization surveillance program. It is not intended to diagnose MRSA infection nor to guide or monitor treatment for MRSA infections. Performed at [facility], [address] [place], [place] [address]      Scheduled Meds: . chlorhexidine  15 mL Mouth Rinse BID  . Chlorhexidine Gluconate Cloth  6 each Topical Daily  . cloNIDine  0.1 mg Transdermal Weekly  . enoxaparin (LOVENOX) injection  60 mg Subcutaneous Q24H  . free water  200 mL Per Tube Q4H  . insulin aspart  0-9 Units Subcutaneous TID WC  . mouth rinse  15 mL Mouth Rinse BID  . methylPREDNISolone (SOLU-MEDROL) injection  20 mg Intravenous Q12H  . metoprolol tartrate  5 mg Intravenous Q6H  . nystatin   Topical BID  . QUEtiapine  12.5 mg Oral QHS   Continuous Infusions: . dextrose 75 mL/hr at 01/28/20 0600  . feeding supplement (OSMOLITE 1.5 CAL)       LOS: 14 days   Lonia Blood, MD [facility] Office  [phone] Pager - Text Page per Amion  If 7PM-7AM, please contact night-coverage per Amion 01/28/2020, 7:24 AM

## 2020-01-28 NOTE — Progress Notes (Signed)
Pt has been very lethargic/drowsy since precedex gtt initiated this AM; remains off at this time. ABG ordered earlier and showed CO2 48.7 and pO2 64.9. Per CCM orders, RT increased patient's O2 to 30 L/ heated HFNC with NRBM. This RN will continue to carefully monitor pt.

## 2020-01-28 NOTE — Progress Notes (Signed)
Pt continues to be extremely agitated; continuously moaning or shouting and attempting to swing legs OOB. Dr. Sharon Seller paged by this RN when HR went into 190s, appearing to be SVT on monitor. Pt also hypertensive, with SBP in 160s.   Orders received for 2 mg ativan once, and CCM consulted and ordered precedex gtt. This RN will continue to carefully monitor pt.

## 2020-01-29 ENCOUNTER — Inpatient Hospital Stay (HOSPITAL_COMMUNITY): Payer: BC Managed Care – PPO

## 2020-01-29 DIAGNOSIS — J159 Unspecified bacterial pneumonia: Secondary | ICD-10-CM

## 2020-01-29 DIAGNOSIS — E876 Hypokalemia: Secondary | ICD-10-CM

## 2020-01-29 DIAGNOSIS — J80 Acute respiratory distress syndrome: Secondary | ICD-10-CM

## 2020-01-29 DIAGNOSIS — G928 Other toxic encephalopathy: Secondary | ICD-10-CM | POA: Insufficient documentation

## 2020-01-29 LAB — COMPREHENSIVE METABOLIC PANEL
ALT: 88 U/L — ABNORMAL HIGH (ref 0–44)
AST: 25 U/L (ref 15–41)
Albumin: 2.6 g/dL — ABNORMAL LOW (ref 3.5–5.0)
Alkaline Phosphatase: 59 U/L (ref 38–126)
Anion gap: 8 (ref 5–15)
BUN: 23 mg/dL (ref 8–23)
CO2: 30 mmol/L (ref 22–32)
Calcium: 8.4 mg/dL — ABNORMAL LOW (ref 8.9–10.3)
Chloride: 100 mmol/L (ref 98–111)
Creatinine, Ser: 0.68 mg/dL (ref 0.44–1.00)
GFR calc Af Amer: >60 mL/min (ref >60)
GFR calc non Af Amer: >60 mL/min (ref >60)
Glucose, Bld: 117 mg/dL — ABNORMAL HIGH (ref 70–99)
Potassium: 3.5 mmol/L (ref 3.5–5.1)
Sodium: 138 mmol/L (ref 135–145)
Total Bilirubin: 0.7 mg/dL (ref 0.3–1.2)
Total Protein: 5.7 g/dL — ABNORMAL LOW (ref 6.5–8.1)

## 2020-01-29 LAB — CBC
HCT: 37.1 % (ref 36.0–46.0)
Hemoglobin: 12.1 g/dL (ref 12.0–15.0)
MCH: 30.5 pg (ref 26.0–34.0)
MCHC: 32.6 g/dL (ref 30.0–36.0)
MCV: 93.5 fL (ref 80.0–100.0)
Platelets: 209 10*3/uL (ref 150–400)
RBC: 3.97 MIL/uL (ref 3.87–5.11)
RDW: 13.7 % (ref 11.5–15.5)
WBC: 26.1 10*3/uL — ABNORMAL HIGH (ref 4.0–10.5)
nRBC: 0 % (ref 0.0–0.2)

## 2020-01-29 LAB — GLUCOSE, CAPILLARY
Glucose-Capillary: 139 mg/dL — ABNORMAL HIGH (ref 70–99)
Glucose-Capillary: 166 mg/dL — ABNORMAL HIGH (ref 70–99)
Glucose-Capillary: 116 mg/dL — ABNORMAL HIGH (ref 70–99)
Glucose-Capillary: 113 mg/dL — ABNORMAL HIGH (ref 70–99)

## 2020-01-29 LAB — D-DIMER, QUANTITATIVE: D-Dimer, Quant: 0.9 ug/mL-FEU — ABNORMAL HIGH (ref 0.00–0.50)

## 2020-01-29 LAB — MAGNESIUM: Magnesium: 2.1 mg/dL (ref 1.7–2.4)

## 2020-01-29 MED ORDER — PROSOURCE TF PO LIQD
45.0000 mL | Freq: Three times a day (TID) | ORAL | Status: DC
  Administered 2020-01-31 – 2020-02-03 (×10): 45 mL
  Filled 2020-01-29 (×6): qty 45

## 2020-01-29 MED ORDER — POTASSIUM CHLORIDE 20 MEQ/15ML (10%) PO SOLN: 50.0000 meq | Freq: Once | ORAL | Status: DC

## 2020-01-29 MED ORDER — POTASSIUM CHLORIDE 10 MEQ/100ML IV SOLN
10.0000 meq | INTRAVENOUS | Status: AC
  Administered 2020-01-29 (×5): 10 meq via INTRAVENOUS
  Filled 2020-01-29 (×5): qty 100

## 2020-01-29 MED ORDER — OSMOLITE 1.5 CAL PO LIQD
1000.0000 mL | ORAL | Status: DC
  Administered 2020-01-31: 1000 mL
  Filled 2020-01-29 (×4): qty 1000

## 2020-01-29 NOTE — Progress Notes (Addendum)
Nutrition Follow-up  DOCUMENTATION CODES:   Morbid obesity  INTERVENTION:  - once small bore NGT in place: Osmolite 1.5 @ 25 ml/hr to advance by 10 ml every 8 hours to reach goal rate of 55 ml/hr with 45 ml Prosource TF TID and 200 ml free water every 4 hours. - at goal rate, this regimen will provide 2100 kcal, 116 grams protein, and 2206 ml free water.    NUTRITION DIAGNOSIS:   Inadequate oral intake related to inability to eat as evidenced by NPO status. -revised, ongoing  GOAL:   Patient will meet greater than or equal to 90% of their needs -unmet, unable to meet  MONITOR:   TF tolerance, Labs, Weight trends  ASSESSMENT:   64 yo unvaccinated female admitted with acute respiratory failure with referactory COVID-19 pneumonia on 8/31. PMH includes HTN.  Patient was discussed in rounds this AM. Nurse and tech caring for patient at the time that RD went by room earlier this afternoon. Patient is noted to be disoriented x4.   During rounds RN reported plan is for patient to go to IR at ~1630 today for small bore NGT placement to then initiate TF. She has been NPO since 9/12.   Weight today is -9.4 kg/20 lb since admission on 9/1. Non-pitting edema to BUE documented in the flow sheet.   Per notes: - ARDS 2/2 COVID-19 PNA - acute metabolic encephalopathy - transaminitis - at risk for malnutrition   Labs reviewed; CBGs: 139 and 166 mg/dl, Ca: 8.4 mg/dl, ALT elevated.  Medications reviewed; sliding scale novolog, 20 mg solu-medrol/day, 10 mEq IV KCl x5 runs 9/15. IVF; D5 @ 60 ml/hr (245 kcal).     NUTRITION - FOCUSED PHYSICAL EXAM:  not completed at this time.   Diet Order:   Diet Order            Diet NPO time specified Except for: Sips with Meds, Ice Chips  Diet effective now                 EDUCATION NEEDS:   Not appropriate for education at this time  Skin:  Skin Assessment: Skin Integrity Issues: Skin Integrity Issues:: Other (Comment) Other: MASD: skin  folds, perineal area  Last BM:  9/15 (type 7 x1; type 6 x1)  Height:   Ht Readings from Last 1 Encounters:  01/15/20 5\' 4"  (1.626 m)    Weight:   Wt Readings from Last 1 Encounters:  01/29/20 109.6 kg    Ideal Body Weight:   (IBW 54.5 kg; ajdusted BW:78 kg)  Estimated Nutritional Needs:  Kcal:  1900-2250 kcals Protein:  110-125 g Fluid:  >/= 2 L      01/31/20, MS, RD, LDN, CNSC Inpatient Clinical Dietitian RD pager # available in AMION  After hours/weekend pager # available in [facility]

## 2020-01-29 NOTE — Progress Notes (Signed)
PROGRESS NOTE    Julie Mcguire  ONG:[id number] DOB: Nov 24, 1955 DOA: 12/17/2019 PCP: Elias Else, MD   Brief Narrative:  [phone] PMHx HTN, and obesity  Recent COVID pneumonia,  who returned to the ED with worsening shortness of breath. She was discharged from the hospital 8/27 following treatment for Covid pneumonia with steroids, remdesivir, and baricitinib.  At the time of discharge she required 4 L oxygen support. When EMS arrived at her home she was found to be saturating 75% on 6 L of nasal cannula and required 15 L via nonrebreather mask to get saturations up to 85%. In the ED CXR noted moderate to severe bilateral patchy infiltrates and the patient was readmitted to the hospital with refractory Covid pneumonia.   Subjective: Afebrile overnight, with periods of hypotension.  Patient currently sedated on Precedex   Assessment & Plan:  Covid vaccination;  Principal Problem:   Acute respiratory failure with hypoxia (HCC) Active Problems:   Pneumonia due to COVID-19 virus   Essential hypertension   Obesity, Class III, BMI 40-49.9 (morbid obesity) (HCC)   COVID-19   SIRS (systemic inflammatory response syndrome) (HCC)   Goals of care, counseling/discussion   Palliative care by specialist   DNR (do not resuscitate) discussion   Acute hypoxic respiratory failure - COVID Pneumonia Seroquel labs  Recent Labs    01/29/20 0240  DDIMER 0.90*    Lab Results  Component Value Date   SARSCOV2NAA POSITIVE (A) 01/07/2020   Solu-Medrol 20 mg daily 8/24 > 8/27 + 8/31 > Baricitinib 8/24 > 8/27 + 8/31 > 9/9 Remdesivir 8/24 > 8/27 + 8/31 > 9/4  Possible secondary bacterial pneumonia -Procalcitonin not convincing of bacterial pneumonia  -discontinued empiric antibiotics after 3 complete days of treatment  Toxic metabolic encephalopathy -CT head without acute findings -9/15 most likely secondary to sedating medication (Precedex) and acute delirium of prolonged critical  illness. -9/15 attempting to wean patient off Precedex but patient becomes agitated and combative. -Still unable to place NG tube, therefore will not be able to use Seroquel, as medication for agitation in order to wean Precedex off. -9/15 obtain EKG and if no QT prolongation will start Haldol and attempt to wean Precedex.  Nutrition -Bedside placement appended to x3 have failed. -IR placement of NG tube cannot be accomplished secondary to hardware failure.  Hopefully by 9/16 there hardware will be repaired. -D5W 23ml/hr  Transaminitis -Resolved except for ALT still mildly elevated   Hypokalemia -Potassium goal> 4 -Potassium IV 50 mEq  DVT prophylaxis: Lovenox Code Status: Full Family Communication:  Status is: Inpatient    Dispo: The patient is from: Home              Anticipated d/c is to: Home              Anticipated d/c date is:??              Patient currently severely unstable      Consultants:  PCCM   Procedures/Significant Events:  8/24 initial admission to hospital - steroids, remdesivir, and baricitinib 8/27 discharge home after treatment for Covid pneumonia 8/31 readmit for refractory Covid pneumonia   I have personally reviewed and interpreted all radiology studies and my findings are as above.  VENTILATOR SETTINGS: HHFNC+NRB 9/15 FiO2; 70% Flow; 30 L/min SPO2 95%   Cultures   Antimicrobials: Anti-infectives (From admission, onward)   Start     Ordered Stop   01/20/20 1130  cefTRIAXone (ROCEPHIN) 2 g in sodium  chloride 0.9 % 100 mL IVPB  Status:  Discontinued        01/20/20 1042 01/22/20 1634   01/20/20 1130  azithromycin (ZITHROMAX) 500 mg in sodium chloride 0.9 % 250 mL IVPB  Status:  Discontinued        01/20/20 1042 01/22/20 1634   01/15/20 1000  remdesivir 100 mg in sodium chloride 0.9 % 100 mL IVPB       "Followed by" Linked Group Details   2020/01/26 1727 01/18/20 1922   January 26, 2020 1830  remdesivir 200 mg in sodium chloride 0.9% 250  mL IVPB       "Followed by" Linked Group Details   01/26/20 1727 2020/01/26 1915       Devices    LINES / TUBES:      Continuous Infusions: . dexmedetomidine (PRECEDEX) IV infusion 0.2 mcg/kg/hr (01/29/20 0500)  . dextrose 60 mL/hr at 01/29/20 0500  . feeding supplement (OSMOLITE 1.5 CAL)       Objective: Vitals:   01/29/20 0600 01/29/20 0620 01/29/20 0631 01/29/20 0700  BP: (!) 94/47 (!) 94/48 (!) 84/53 (!) 97/49  Pulse: 79 75 74 70  Resp: (!) 32 (!) 32 (!) 27 17  Temp:      TempSrc:      SpO2: 96% 96% 95% 95%  Weight:      Height:        Intake/Output Summary (Last 24 hours) at 01/29/2020 0744 Last data filed at 01/29/2020 0500 Gross per 24 hour  Intake 1657.26 ml  Output 2400 ml  Net -742.74 ml   Filed Weights   01/27/20 0500 01/28/20 0500 01/29/20 0500  Weight: 111.7 kg 111.1 kg 109.6 kg    Examination:  General: Withdraws slightly to pain, states stop to painful stimuli, positive acute respiratory distress Eyes: negative scleral hemorrhage, negative anisocoria, negative icterus ENT: Negative Runny nose, negative gingival bleeding, Neck:  Negative scars, masses, torticollis, lymphadenopathy, JVD Lungs: tachypneic decreased breath sounds bilaterally without wheezes or crackles Cardiovascular: Regular rate and rhythm without murmur gallop or rub normal S1 and S2 Abdomen: negative abdominal pain, nondistended, positive soft, bowel sounds, no rebound, no ascites, no appreciable mass Extremities: No significant cyanosis, clubbing, or edema bilateral lower extremities Skin: Negative rashes, lesions, ulcers Psychiatric: Unable to assess secondary to altered mental status Central nervous system: Unable to assess secondary to altered mental status.  Does withdraw slightly to painful stimuli. .     Data Reviewed: Care during the described time interval was provided by me .  I have reviewed this patient's available data, including medical history, events of note,  physical examination, and all test results as part of my evaluation.   CBC: Recent Labs  Lab 01/24/20 0238 01/25/20 0244 01/26/20 0258 01/27/20 0315 01/29/20 0240  WBC 17.0* 19.7* 19.4* 24.6* 26.1*  HGB 12.0 11.8* 10.5* 11.7* 12.1  HCT 39.6 38.1 32.5* 35.6* 37.1  MCV 100.0 96.2 95.0 93.7 93.5  PLT 299 315 278 264 209   Basic Metabolic Panel: Recent Labs  Lab 01/23/20 0233 01/23/20 0233 01/24/20 0238 01/25/20 0244 01/26/20 0258 01/27/20 0315 01/29/20 0240  NA 155*   < > 154* 152* 144 141 138  K 3.4*   < > 3.9 4.2 4.0 4.7 3.5  CL 110   < > 109 103 101 100 100  CO2 36*   < > 35* 38* 35* 32 30  GLUCOSE 208*   < > 173* 182* 165* 171* 117*  BUN 55*   < > 43*  40* 31* 24* 23  CREATININE 0.99   < > 0.80 0.73 0.56 0.58 0.68  CALCIUM 8.9   < > 8.7* 9.0 8.5* 8.6* 8.4*  MG 3.0*  --  2.9* 2.6*  --  2.3 2.1   < > = values in this interval not displayed.   GFR: Estimated Creatinine Clearance: 89.4 mL/min (by C-G formula based on SCr of 0.68 mg/dL). Liver Function Tests: Recent Labs  Lab 01/24/20 0238 01/25/20 0244 01/26/20 0258 01/27/20 0315 01/29/20 0240  AST 33 88* 61* 53* 25  ALT 43 112* 127* 149* 88*  ALKPHOS 47 50 50 59 59  BILITOT 0.8 1.0 0.7 0.8 0.7  PROT 5.6* 5.9* 5.1* 5.6* 5.7*  ALBUMIN 2.7* 2.9* 2.5* 2.7* 2.6*   No results for input(s): LIPASE, AMYLASE in the last 168 hours. No results for input(s): AMMONIA in the last 168 hours. Coagulation Profile: No results for input(s): INR, PROTIME in the last 168 hours. Cardiac Enzymes: No results for input(s): CKTOTAL, CKMB, CKMBINDEX, TROPONINI in the last 168 hours. BNP (last 3 results) No results for input(s): PROBNP in the last 8760 hours. HbA1C: No results for input(s): HGBA1C in the last 72 hours. CBG: Recent Labs  Lab 01/27/20 2155 01/28/20 0741 01/28/20 1106 01/28/20 1716 01/28/20 2112  GLUCAP 127* 142* 174* 137* 113*   Lipid Profile: No results for input(s): CHOL, HDL, LDLCALC, TRIG, CHOLHDL,  LDLDIRECT in the last 72 hours. Thyroid Function Tests: No results for input(s): TSH, T4TOTAL, FREET4, T3FREE, THYROIDAB in the last 72 hours. Anemia Panel: No results for input(s): VITAMINB12, FOLATE, FERRITIN, TIBC, IRON, RETICCTPCT in the last 72 hours. Urine analysis:    Component Value Date/Time   COLORURINE YELLOW 01/07/2020 1819   APPEARANCEUR TURBID (A) 01/07/2020 1819   LABSPEC 1.018 01/07/2020 1819   PHURINE 5.0 01/07/2020 1819   GLUCOSEU NEGATIVE 01/07/2020 1819   HGBUR MODERATE (A) 01/07/2020 1819   BILIRUBINUR NEGATIVE 01/07/2020 1819   KETONESUR NEGATIVE 01/07/2020 1819   PROTEINUR >=300 (A) 01/07/2020 1819   NITRITE POSITIVE (A) 01/07/2020 1819   LEUKOCYTESUR TRACE (A) 01/07/2020 1819   Sepsis Labs: @LABRCNTIP (procalcitonin:4,lacticidven:4)  ) Recent Results (from the past 240 hour(s))  Culture, sputum-assessment     Status: None   Collection Time: 01/20/20 11:45 AM   Specimen: Expectorated Sputum  Result Value Ref Range Status   Specimen Description Expect. Sput  Final   Special Requests NONE  Final   Sputum evaluation   Final    THIS SPECIMEN IS ACCEPTABLE FOR SPUTUM CULTURE Performed at [facility], [address] [place], [place]    Report Status 01/20/2020 FINAL  Final  Culture, respiratory     Status: None   Collection Time: 01/20/20 11:45 AM  Result Value Ref Range Status   Specimen Description   Final    Expect. Sput Performed at [facility], [address], [place], [place]    Special Requests   Final    NONE Reflexed from [id number] Performed at [facility], [address], [place], [place]    Gram Stain   Final    FEW WBC PRESENT,BOTH PMN AND MONONUCLEAR ABUNDANT GRAM POSITIVE COCCI RARE GRAM NEGATIVE RODS RARE YEAST    Culture   Final    ABUNDANT Consistent with normal respiratory flora. No Pseudomonas species isolated Performed at [facility], [address], [place], [place]    Report Status 01/22/2020 FINAL  Final  MRSA PCR Screening     Status: None   Collection Time: 01/20/20  5:09 PM   Specimen: Nasopharyngeal  Result Value Ref Range Status   MRSA by PCR NEGATIVE NEGATIVE Final    Comment:        The GeneXpert MRSA Assay (FDA approved for NASAL specimens only), is one component of a comprehensive MRSA colonization surveillance program. It is not intended to diagnose MRSA infection nor to guide or monitor treatment for MRSA infections. Performed at [facility], [address] [place], [place] [id number]          Radiology Studies: CT HEAD WO CONTRAST  Result Date: 01/27/2020 CLINICAL DATA:  Mental status change EXAM: CT HEAD WITHOUT CONTRAST TECHNIQUE: Contiguous axial images were obtained from the base of the skull through the vertex without intravenous contrast. COMPARISON:  None. FINDINGS: Brain: No evidence of acute infarction, hemorrhage, extra-axial collection, ventriculomegaly, or mass effect. Generalized cerebral atrophy. Periventricular white matter low attenuation likely secondary to microangiopathy. Vascular: No significant cerebrovascular atherosclerosis. No hyperdense vessel. Skull: Negative for fracture or focal lesion. Sinuses/Orbits: Visualized portions of the orbits are unremarkable. Visualized portions of the paranasal sinuses are unremarkable. Bilateral mastoid effusions. Other: None. IMPRESSION: 1. No acute intracranial pathology. 2. Chronic microvascular disease and cerebral atrophy. 3. Bilateral mastoid effusions. Electronically Signed   By: Elige KoHetal  Patel   On: 01/27/2020 14:07   DG CHEST PORT 1 VIEW  Result Date: 01/29/2020 CLINICAL DATA:  COVID-19 positivity EXAM: PORTABLE CHEST 1 VIEW COMPARISON:  01/25/2020 FINDINGS: Cardiac shadow is stable. Diffuse bilateral airspace opacities are again identified stable in appearance from the prior exam. No new focal  abnormality is noted. No bony abnormality is noted. IMPRESSION: Stable airspace opacities bilaterally consistent with the given clinical history. Electronically Signed   By: Alcide CleverMark  Lukens M.D.   On: 01/29/2020 07:22        Scheduled Meds: . chlorhexidine  15 mL Mouth Rinse BID  . Chlorhexidine Gluconate Cloth  6 each Topical Daily  . enoxaparin (LOVENOX) injection  60 mg Subcutaneous Q24H  . free water  200 mL Per Tube Q4H  . insulin aspart  0-9 Units Subcutaneous TID WC  . mouth rinse  15 mL Mouth Rinse BID  . methylPREDNISolone (SOLU-MEDROL) injection  20 mg Intravenous Q24H  . nystatin   Topical BID   Continuous Infusions: . dexmedetomidine (PRECEDEX) IV infusion 0.2 mcg/kg/hr (01/29/20 0500)  . dextrose 60 mL/hr at 01/29/20 0500  . feeding supplement (OSMOLITE 1.5 CAL)       LOS: 15 days   The patient is critically ill with multiple organ systems failure and requires high complexity decision making for assessment and support, frequent evaluation and titration of therapies, application of advanced monitoring technologies and extensive interpretation of multiple databases. Critical Care Time devoted to patient care services described in this note  Time spent: 40 minutes     Mauricio Dahlen, Roselind MessierURTIS J, MD [facility] Pager [phone]  If 7PM-7AM, please contact night-coverage [email or web address] Password [email or web address] 01/29/2020, 7:44 AM

## 2020-01-29 NOTE — Progress Notes (Signed)
NAME:  Julie Mcguire, MRN:  [id number], DOB:  [birth year], LOS: 15 ADMISSION DATE:  02/10/20, CONSULTATION DATE:  01/28/20 REFERRING MD:  Dr. Sharon Seller, CHIEF COMPLAINT:     Brief History   64 y/o F admitted with acute hypoxemic respiratory failure in the setting of COVID PNA   History of present illness   64 y/o F, non-vaccinated,  who was recently admitted from 8/24-8/27 with COVID PNA who returned to the ER on 8/31 with reports of worsening shortness of breath and hypoxemia.   The patient was treated during the last admission with steroids, remdesivir and baricitinib.  At time of discharge she required 4L oxygen support.  On EMS arrival, she was found to have saturations of 75% on 6L and was escalated to NRB with improvement of saturations to 85%.  Initial CXR in the ER demonstrated bilateral infiltrates.  She was admitted to the hospital per [facility] for further care.  The patient was treated with IV steroids. D-dimer elevated, CT angio chest and lower extremity duplex negative. Strep antigen and urine legionella negative.  She had intermittent encephalopathy and was treated with IV ativan.  She remained on NRB.  PCCM consulted 9/14 for evaluation of encephalopathy and hypoxic respiratory failure  Past Medical History  HTN Obesity   Significant Hospital Events   8/24-8/27 Admit with COVID 8/31 Admit with worsening shortness of breath 9/14 PCCM consulted for evaluation of encephalopathy and hypoxic respiratory failure  Consults:    Procedures:     Significant Diagnostic Tests:   CTA Chest 9/2 >> negative for acute PE to the segmental level, findings consistent with known COVID infection and superimposed pulmonary edema   Venous Duplex BLE 9/2 >> negative bilaterally   Micro Data:  COVID 8/24 >> negative  Strep Antigen 9/6 >> negative  U. Legionella 9/6 >> negative   Antimicrobials:  Ceftriaxone 9/6 >> 9/8  Azithromycin 9/6 >> 9/8   Interim history/subjective:  Precedex  initiated yesterday. Initermittently used for agitation over the past 24 hours.    Objective   Blood pressure (!) 105/59, pulse 90, temperature 98.4 F (36.9 C), temperature source Axillary, resp. rate (!) 24, height 5\' 4"  (1.626 m), weight 109.6 kg, SpO2 95 %.    FiO2 (%):  [70 %] 70 %   Intake/Output Summary (Last 24 hours) at 01/29/2020 1541 Last data filed at 01/29/2020 1400 Gross per 24 hour  Intake 1824.12 ml  Output 800 ml  Net 1024.12 ml   Filed Weights   01/27/20 0500 01/28/20 0500 01/29/20 0500  Weight: 111.7 kg 111.1 kg 109.6 kg   Examination: General: adult female lying in bed, ill appearing   HEENT: MM pink/moist, NRB mask in place, pupils 42mm reactive  Neuro: sedate, arouses to verbal stimuli CV: s1s2 rrr, no m/r/g PULM: non-labored, mild abdominal accessory muscle use, lungs diminished bilaterally, periods of questionable sleep apnea while at rest  GI: soft, bsx4 active  Extremities: warm/dry, 1-2+ BLE pitting edema  Skin: no rashes or lesions  Resolved Hospital Problem list      Assessment & Plan:   ARDS / Acute Hypoxic Respiratory Failure in setting of COVID PNA Treated with baricitinib, remdesivir & steroids during first admit.  Now on steroids.  Initially treated with IV abx for 3 days but PCT not convincing for acute bacterial component.  -abg reviewed, return to heated high flow  -wean O2 for sats >85% -threshold for intubation would be change in mental status or increase in respiratory fatigue -follow  intermittent CXR  -encourage prone positioning  -wean steroids as able, day 14 -precedex as below   Acute Metabolic Encephalopathy  Suspect multifactorial in setting of hypoxia, steroids and medications -supportive care -stop ativan  -continue precedex at lower rates / avoid oversedation  -RASS goal 0 to -1   Transaminitis  -per primary   Elevated D-Dimer  Venous duplex negative for DVT, CTA chest negative for PE -monitor  Anemia -trend CBC    Hypernatremia  -per primary -continue D5w at 62ml/hr, consider reduction in am   Steroid Induced Hyperglycemia  -SSI  At Risk Malnutrition  -cortrak placement today, difficulty with hiatal hernia so will need IR placement   Best practice:  Diet: NPO, can start TFs once cortak is in place Pain/Anxiety/Delirium protocol (if indicated): precedex  VAP protocol (if indicated): n/a DVT prophylaxis: per primary  GI prophylaxis: n/a  Glucose control: SSI  Mobility: As tolerated  Code Status: Full code  Family Communication: per primary  Disposition: SDU  Labs   CBC: Recent Labs  Lab 01/24/20 0238 01/25/20 0244 01/26/20 0258 01/27/20 0315 01/29/20 0240  WBC 17.0* 19.7* 19.4* 24.6* 26.1*  HGB 12.0 11.8* 10.5* 11.7* 12.1  HCT 39.6 38.1 32.5* 35.6* 37.1  MCV 100.0 96.2 95.0 93.7 93.5  PLT 299 315 278 264 209    Basic Metabolic Panel: Recent Labs  Lab 01/23/20 0233 01/23/20 0233 01/24/20 0238 01/25/20 0244 01/26/20 0258 01/27/20 0315 01/29/20 0240  NA 155*   < > 154* 152* 144 141 138  K 3.4*   < > 3.9 4.2 4.0 4.7 3.5  CL 110   < > 109 103 101 100 100  CO2 36*   < > 35* 38* 35* 32 30  GLUCOSE 208*   < > 173* 182* 165* 171* 117*  BUN 55*   < > 43* 40* 31* 24* 23  CREATININE 0.99   < > 0.80 0.73 0.56 0.58 0.68  CALCIUM 8.9   < > 8.7* 9.0 8.5* 8.6* 8.4*  MG 3.0*  --  2.9* 2.6*  --  2.3 2.1   < > = values in this interval not displayed.   GFR: Estimated Creatinine Clearance: 89.4 mL/min (by C-G formula based on SCr of 0.68 mg/dL). Recent Labs  Lab 01/25/20 0244 01/26/20 0258 01/27/20 0315 01/29/20 0240  WBC 19.7* 19.4* 24.6* 26.1*    Liver Function Tests: Recent Labs  Lab 01/24/20 0238 01/25/20 0244 01/26/20 0258 01/27/20 0315 01/29/20 0240  AST 33 88* 61* 53* 25  ALT 43 112* 127* 149* 88*  ALKPHOS 47 50 50 59 59  BILITOT 0.8 1.0 0.7 0.8 0.7  PROT 5.6* 5.9* 5.1* 5.6* 5.7*  ALBUMIN 2.7* 2.9* 2.5* 2.7* 2.6*   No results for input(s): LIPASE,  AMYLASE in the last 168 hours. No results for input(s): AMMONIA in the last 168 hours.  ABG    Component Value Date/Time   PHART 7.433 01/28/2020 1245   PCO2ART 48.7 (H) 01/28/2020 1245   PO2ART 64.9 (L) 01/28/2020 1245   HCO3 32.0 (H) 01/28/2020 1245   O2SAT 91.6 01/28/2020 1245     Coagulation Profile: No results for input(s): INR, PROTIME in the last 168 hours.  Cardiac Enzymes: No results for input(s): CKTOTAL, CKMB, CKMBINDEX, TROPONINI in the last 168 hours.  HbA1C: Hgb A1c MFr Bld  Date/Time Value Ref Range Status  01/23/2020 02:33 AM 6.4 (H) 4.8 - 5.6 % Final    Comment:    (NOTE) Pre diabetes:  5.7%-6.4%  Diabetes:              >6.4%  Glycemic control for   <7.0% adults with diabetes     CBG: Recent Labs  Lab 01/28/20 1106 01/28/20 1716 01/28/20 2112 01/29/20 0746 01/29/20 1117  GLUCAP 174* 137* 113* 139* 166*     Critical care time: 30 minutes     Melody Comas, MD [facility] Office: [phone]   See Amion for Pager Details

## 2020-01-29 NOTE — TOC Progression Note (Signed)
Transition of Care [facility]) - Progression Note    Patient Details  Name: Julie Mcguire MRN: [id number] Date of Birth: 21-Dec-1955  Transition of [facility] CM/SW Contact  Golda Acre, RN Phone Number: 01/29/2020, 7:54 AM  Clinical Narrative:    COVID POSITIVE UNVACCINATED female. hfnrb at 70%, iv solumedrol and iv precedex, d5 aT 60CC/HR, TUBE FEEDINGS.  wBC-26.1   Expected Discharge Plan: Home/Self Care Barriers to Discharge: Continued Medical Work up  Expected Discharge Plan and Services Expected Discharge Plan: Home/Self Care   Discharge Planning Services: CM Consult   Living arrangements for the past 2 months: Single Family Home                                       Social Determinants of Health (SDOH) Interventions    Readmission Risk Interventions No flowsheet data found.

## 2020-01-29 NOTE — Progress Notes (Signed)
Per technician, flouro tower down again today; unable to place feeding tube as planned. This RN along with charge RN, attempted to place panda tube at bedside, but tube was coiled within hiatal hernia on X-Ray. Flouro tower will hopefully be running tomorrow according to Pensions consultant; CCM notified by this RN.

## 2020-01-29 NOTE — Progress Notes (Signed)
OT Cancellation Note  Patient Details Name: Julie Mcguire MRN: [id number] DOB: Oct 04, 1955   Cancelled Treatment:    Reason Eval/Treat Not Completed: Patient not medically ready. Continues to be agitated and restrained. Will continue to folllow.  Brookley Spitler L Charlea Nardo 01/29/2020, 3:28 PM

## 2020-01-30 ENCOUNTER — Inpatient Hospital Stay (HOSPITAL_COMMUNITY): Payer: BC Managed Care – PPO

## 2020-01-30 DIAGNOSIS — R7401 Elevation of levels of liver transaminase levels: Secondary | ICD-10-CM

## 2020-01-30 DIAGNOSIS — G92 Toxic encephalopathy: Secondary | ICD-10-CM

## 2020-01-30 LAB — CBC WITH DIFFERENTIAL/PLATELET
Abs Immature Granulocytes: 2 10*3/uL — ABNORMAL HIGH (ref 0.00–0.07)
Basophils Absolute: 0 10*3/uL (ref 0.0–0.1)
Basophils Relative: 0 %
Eosinophils Absolute: 0 10*3/uL (ref 0.0–0.5)
Eosinophils Relative: 0 %
HCT: 35 % — ABNORMAL LOW (ref 36.0–46.0)
Hemoglobin: 11.2 g/dL — ABNORMAL LOW (ref 12.0–15.0)
Lymphocytes Relative: 4 %
Lymphs Abs: 1.4 10*3/uL (ref 0.7–4.0)
MCH: 30.5 pg (ref 26.0–34.0)
MCHC: 32 g/dL (ref 30.0–36.0)
MCV: 95.4 fL (ref 80.0–100.0)
Metamyelocytes Relative: 4 %
Monocytes Absolute: 0 10*3/uL — ABNORMAL LOW (ref 0.1–1.0)
Monocytes Relative: 0 %
Myelocytes: 2 %
Neutro Abs: 30.6 10*3/uL — ABNORMAL HIGH (ref 1.7–7.7)
Neutrophils Relative %: 90 %
Platelets: 176 10*3/uL (ref 150–400)
RBC: 3.67 MIL/uL — ABNORMAL LOW (ref 3.87–5.11)
RDW: 14.1 % (ref 11.5–15.5)
WBC: 34 10*3/uL — ABNORMAL HIGH (ref 4.0–10.5)
nRBC: 0 % (ref 0.0–0.2)

## 2020-01-30 LAB — LACTATE DEHYDROGENASE: LDH: 276 U/L — ABNORMAL HIGH (ref 98–192)

## 2020-01-30 LAB — COMPREHENSIVE METABOLIC PANEL
ALT: 67 U/L — ABNORMAL HIGH (ref 0–44)
AST: 21 U/L (ref 15–41)
Albumin: 2.4 g/dL — ABNORMAL LOW (ref 3.5–5.0)
Alkaline Phosphatase: 61 U/L (ref 38–126)
Anion gap: 11 (ref 5–15)
BUN: 23 mg/dL (ref 8–23)
CO2: 25 mmol/L (ref 22–32)
Calcium: 8.4 mg/dL — ABNORMAL LOW (ref 8.9–10.3)
Chloride: 100 mmol/L (ref 98–111)
Creatinine, Ser: 0.88 mg/dL (ref 0.44–1.00)
GFR calc Af Amer: >60 mL/min (ref >60)
GFR calc non Af Amer: >60 mL/min (ref >60)
Glucose, Bld: 124 mg/dL — ABNORMAL HIGH (ref 70–99)
Potassium: 4.2 mmol/L (ref 3.5–5.1)
Sodium: 136 mmol/L (ref 135–145)
Total Bilirubin: 1.2 mg/dL (ref 0.3–1.2)
Total Protein: 5.5 g/dL — ABNORMAL LOW (ref 6.5–8.1)

## 2020-01-30 LAB — GLUCOSE, CAPILLARY
Glucose-Capillary: 179 mg/dL — ABNORMAL HIGH (ref 70–99)
Glucose-Capillary: 218 mg/dL — ABNORMAL HIGH (ref 70–99)
Glucose-Capillary: 133 mg/dL — ABNORMAL HIGH (ref 70–99)
Glucose-Capillary: 120 mg/dL — ABNORMAL HIGH (ref 70–99)
Glucose-Capillary: 119 mg/dL — ABNORMAL HIGH (ref 70–99)

## 2020-01-30 LAB — FERRITIN: Ferritin: 830 ng/mL — ABNORMAL HIGH (ref 11–307)

## 2020-01-30 LAB — C-REACTIVE PROTEIN: CRP: 10.9 mg/dL — ABNORMAL HIGH (ref <1.0)

## 2020-01-30 LAB — PHOSPHORUS: Phosphorus: 2.8 mg/dL (ref 2.5–4.6)

## 2020-01-30 LAB — MAGNESIUM: Magnesium: 2.2 mg/dL (ref 1.7–2.4)

## 2020-01-30 LAB — D-DIMER, QUANTITATIVE: D-Dimer, Quant: 0.81 ug/mL-FEU — ABNORMAL HIGH (ref 0.00–0.50)

## 2020-01-30 NOTE — Progress Notes (Signed)
NAME:  Julie Mcguire, MRN:  [id number], DOB:  July 10, 1955, LOS: 16 ADMISSION DATE:  02/12/20, CONSULTATION DATE:  01/28/20 REFERRING MD:  Dr. Sharon Seller, CHIEF COMPLAINT:     Brief History   64 y/o F admitted with acute hypoxemic respiratory failure in the setting of COVID PNA   History of present illness   64 y/o F, non-vaccinated,  who was recently admitted from 8/24-8/27 with COVID PNA who returned to the ER on 8/31 with reports of worsening shortness of breath and hypoxemia.   The patient was treated during the last admission with steroids, remdesivir and baricitinib.  At time of discharge she required 4L oxygen support.  On EMS arrival, she was found to have saturations of 75% on 6L and was escalated to NRB with improvement of saturations to 85%.  Initial CXR in the ER demonstrated bilateral infiltrates.  She was admitted to the hospital per [facility] for further care.  The patient was treated with IV steroids. D-dimer elevated, CT angio chest and lower extremity duplex negative. Strep antigen and urine legionella negative.  She had intermittent encephalopathy and was treated with IV ativan.  She remained on NRB.  PCCM consulted 9/14 for evaluation of encephalopathy and hypoxic respiratory failure  Past Medical History  HTN Obesity   Significant Hospital Events   8/24-8/27 Admit with COVID 8/31 Admit with worsening shortness of breath 9/14 PCCM consulted for evaluation of encephalopathy and hypoxic respiratory failure 9/15 Precedex started  Consults:    Procedures:     Significant Diagnostic Tests:   CTA Chest 9/2 >> negative for acute PE to the segmental level, findings consistent with known COVID infection and superimposed pulmonary edema   Venous Duplex BLE 9/2 >> negative bilaterally   Micro Data:  COVID 8/24 >> negative  Strep Antigen 9/6 >> negative  U. Legionella 9/6 >> negative   Antimicrobials:  Ceftriaxone 9/6 >> 9/8  Azithromycin 9/6 >> 9/8   Interim  history/subjective:   Remains critically ill, on high flow nasal cannula and nonrebreather especially during nursing care when she desaturates. On low-dose Precedex   Objective   Blood pressure 105/64, pulse 66, temperature 99.4 F (37.4 C), temperature source Axillary, resp. rate 14, height 5\' 4"  (1.626 m), weight 109.1 kg, SpO2 96 %.    FiO2 (%):  [60 %-80 %] 80 %   Intake/Output Summary (Last 24 hours) at 01/30/2020 1221 Last data filed at 01/30/2020 0600 Gross per 24 hour  Intake 1685.7 ml  Output 700 ml  Net 985.7 ml   Filed Weights   01/28/20 0500 01/29/20 0500 01/30/20 0500  Weight: 111.1 kg 109.6 kg 109.1 kg   Examination: General: adult female lying in bed, ill appearing, propped up position HEENT: MM pink/moist, NRB mask + HHFNC Neuro: Sedate, calm, responds to one-step commands CV: s1s2 rrr, no m/r/g PULM: non-labored, mild abdominal accessory muscle use, lungs diminished bilaterally,  GI: soft, bsx4 active  Extremities: warm/dry, 1-2+ BLE pitting edema  Skin: no rashes or lesions  Chest x-ray 9/15 independently reviewed which shows stable bilateral airspace disease Labs show normal electrolytes, increase leukocytosis , stable anemia  Resolved Hospital Problem list      Assessment & Plan:   ARDS / Acute Hypoxic Respiratory Failure in setting of COVID PNA Treated with baricitinib, remdesivir & steroids during first admit.  Now on steroids.  Initially treated with IV abx for 3 days but PCT not convincing for acute bacterial component.  -Continue heated high flow, weaned off nonrebreather -  wean O2 for sats >85% -threshold for intubation would be change in mental status or increase in respiratory fatigue -follow intermittent CXR  -encourage self prone positioning as tolerated -wean steroids to off   Acute Metabolic Encephalopathy  Suspect multifactorial in setting of hypoxia, steroids and medications -Avoid benzodiazepines -continue precedex at lower rates /  avoid oversedation  -RASS goal 0 to -1   Transaminitis , resolving -per primary   Elevated D-Dimer  Venous duplex negative for DVT, CTA chest negative for PE -monitor  Anemia -trend CBC   Hypernatremia  -per primary -Sodium trending lower, lower D5W  Steroid Induced Hyperglycemia  -SSI  At Risk Malnutrition  -cortrak placement with fluoroscopy planned, difficulty with hiatal hernia so will need IR placement   She is 23 days since her original diagnosis oxygen requirements are marginally decreased.  I think we can discontinue isolation precautions and allow her husband to visit, hopefully this will also help with delirium  Best practice:  Diet: NPO, can start TFs once cortak is in place Pain/Anxiety/Delirium protocol (if indicated): precedex  VAP protocol (if indicated): n/a DVT prophylaxis: per primary  GI prophylaxis: n/a  Glucose control: SSI  Mobility: As tolerated  Code Status: Full code  Family Communication: per primary  Disposition: SDU  Labs   CBC: Recent Labs  Lab 01/25/20 0244 01/26/20 0258 01/27/20 0315 01/29/20 0240 01/30/20 0222  WBC 19.7* 19.4* 24.6* 26.1* 34.0*  NEUTROABS  --   --   --   --  30.6*  HGB 11.8* 10.5* 11.7* 12.1 11.2*  HCT 38.1 32.5* 35.6* 37.1 35.0*  MCV 96.2 95.0 93.7 93.5 95.4  PLT 315 278 264 209 176    Basic Metabolic Panel: Recent Labs  Lab 01/24/20 0238 01/24/20 0238 01/25/20 0244 01/26/20 0258 01/27/20 0315 01/29/20 0240 01/30/20 0222  NA 154*   < > 152* 144 141 138 136  K 3.9   < > 4.2 4.0 4.7 3.5 4.2  CL 109   < > 103 101 100 100 100  CO2 35*   < > 38* 35* 32 30 25  GLUCOSE 173*   < > 182* 165* 171* 117* 124*  BUN 43*   < > 40* 31* 24* 23 23  CREATININE 0.80   < > 0.73 0.56 0.58 0.68 0.88  CALCIUM 8.7*   < > 9.0 8.5* 8.6* 8.4* 8.4*  MG 2.9*  --  2.6*  --  2.3 2.1 2.2  PHOS  --   --   --   --   --   --  2.8   < > = values in this interval not displayed.   GFR: Estimated Creatinine Clearance: 81.1 mL/min  (by C-G formula based on SCr of 0.88 mg/dL). Recent Labs  Lab 01/26/20 0258 01/27/20 0315 01/29/20 0240 01/30/20 0222  WBC 19.4* 24.6* 26.1* 34.0*    Liver Function Tests: Recent Labs  Lab 01/25/20 0244 01/26/20 0258 01/27/20 0315 01/29/20 0240 01/30/20 0222  AST 88* 61* 53* 25 21  ALT 112* 127* 149* 88* 67*  ALKPHOS 50 50 59 59 61  BILITOT 1.0 0.7 0.8 0.7 1.2  PROT 5.9* 5.1* 5.6* 5.7* 5.5*  ALBUMIN 2.9* 2.5* 2.7* 2.6* 2.4*   No results for input(s): LIPASE, AMYLASE in the last 168 hours. No results for input(s): AMMONIA in the last 168 hours.  ABG    Component Value Date/Time   PHART 7.433 01/28/2020 1245   PCO2ART 48.7 (H) 01/28/2020 1245   PO2ART 64.9 (L) 01/28/2020  1245   HCO3 32.0 (H) 01/28/2020 1245   O2SAT 91.6 01/28/2020 1245     Coagulation Profile: No results for input(s): INR, PROTIME in the last 168 hours.  Cardiac Enzymes: No results for input(s): CKTOTAL, CKMB, CKMBINDEX, TROPONINI in the last 168 hours.  HbA1C: Hgb A1c MFr Bld  Date/Time Value Ref Range Status  01/23/2020 02:33 AM 6.4 (H) 4.8 - 5.6 % Final    Comment:    (NOTE) Pre diabetes:          5.7%-6.4%  Diabetes:              >6.4%  Glycemic control for   <7.0% adults with diabetes     CBG: Recent Labs  Lab 01/29/20 0746 01/29/20 1117 01/29/20 1634 01/29/20 2048 01/30/20 0838  GLUCAP 139* 166* 116* 113* 179*     Critical care time: 30 minutes      Cyril Mourning MD. FCCP.  Pulmonary & Critical care See Amion for pager  If no response to pager , please call [phone]  After 7:00 pm call Elink  [phone]   01/30/2020

## 2020-01-30 NOTE — Progress Notes (Signed)
PROGRESS NOTE    Julie Mcguire  UTM:[id number] DOB: 11-01-1955 DOA: 12/24/2019 PCP: Elias Else, MD   Brief Narrative:  [phone] PMHx HTN, and obesity  Recent COVID pneumonia,  who returned to the ED with worsening shortness of breath. She was discharged from the hospital 8/27 following treatment for Covid pneumonia with steroids, remdesivir, and baricitinib.  At the time of discharge she required 4 L oxygen support. When EMS arrived at her home she was found to be saturating 75% on 6 L of nasal cannula and required 15 L via nonrebreather mask to get saturations up to 85%. In the ED CXR noted moderate to severe bilateral patchy infiltrates and the patient was readmitted to the hospital with refractory Covid pneumonia.   Subjective: 9/16 elevated temp overnight but no fever.  A/O x2 (did not know when, why, where) but did know she was in the hospital.   Assessment & Plan:  Covid vaccination;  Principal Problem:   Acute respiratory failure with hypoxia (HCC) Active Problems:   Pneumonia due to COVID-19 virus   Essential hypertension   Obesity, Class III, BMI 40-49.9 (morbid obesity) (HCC)   COVID-19   SIRS (systemic inflammatory response syndrome) (HCC)   Goals of care, counseling/discussion   Palliative care by specialist   DNR (do not resuscitate) discussion   Secondary bacterial pneumonia   Hypokalemia   Toxic metabolic encephalopathy   Bacterial pneumonia   Transaminitis   Acute respiratory failure with hypoxia- COVID Pneumonia Seroquel labs  Recent Labs    01/29/20 0240 01/30/20 0222 01/31/20 0239  DDIMER 0.90* 0.81* 0.93*  FERRITIN  --  830* 831*  LDH  --  276* 277*  CRP  --  10.9* 8.3*    Lab Results  Component Value Date   SARSCOV2NAA POSITIVE (A) 01/07/2020   Solu-Medrol 20 mg daily 8/24 > 8/27 + 8/31 > Baricitinib 8/24 > 8/27 + 8/31 > 9/9 Remdesivir 8/24 > 8/27 + 8/31 > 9/4  Possible secondary bacterial pneumonia -Procalcitonin not convincing of  bacterial pneumonia  -discontinued empiric antibiotics after 3 complete days of treatment  Toxic metabolic encephalopathy -CT head without acute findings -9/15 most likely secondary to sedating medication (Precedex) and acute delirium of prolonged critical illness. -9/15 attempting to wean patient off Precedex but patient becomes agitated and combative. -Still unable to place NG tube, therefore will not be able to use Seroquel, as medication for agitation in order to wean Precedex off. -9/15 obtain EKG and if no QT prolongation will start Haldol and attempt to wean Precedex. -9/16 patient's encephalopathy appears to be improving.  We will hold on starting Haldol, patient's family is supposed to be visiting today we will see if her cognition improves hopefully will be able to wean Precedex without adding any additional agents..  Nutrition -Bedside placement appended to x3 have failed. -IR placement of NG tube cannot be accomplished secondary to hardware failure.  Hopefully by 9/16 there hardware will be repaired. -D5W 46ml/hr -9/16 IR to place NG tube  Transaminitis -Resolved except for ALT still mildly elevated   Hypokalemia -Potassium goal> 4  DVT prophylaxis: Lovenox Code Status: Full Family Communication:  Status is: Inpatient    Dispo: The patient is from: Home              Anticipated d/c is to: Home              Anticipated d/c date is:??  Patient currently severely unstable      Consultants:  PCCM   Procedures/Significant Events:  8/24 initial admission to hospital - steroids, remdesivir, and baricitinib 8/27 discharge home after treatment for Covid pneumonia 8/31 readmit for refractory Covid pneumonia   I have personally reviewed and interpreted all radiology studies and my findings are as above.  VENTILATOR SETTINGS: HHFNC +NRB 9/16 FiO2; 60% Flow; 30 L/min SPO2 93%   Cultures   Antimicrobials: Anti-infectives (From admission,  onward)   Start     Ordered Stop   01/20/20 1130  cefTRIAXone (ROCEPHIN) 2 g in sodium chloride 0.9 % 100 mL IVPB  Status:  Discontinued        01/20/20 1042 01/22/20 1634   01/20/20 1130  azithromycin (ZITHROMAX) 500 mg in sodium chloride 0.9 % 250 mL IVPB  Status:  Discontinued        01/20/20 1042 01/22/20 1634   01/15/20 1000  remdesivir 100 mg in sodium chloride 0.9 % 100 mL IVPB       "Followed by" Linked Group Details   29-May-2019 1727 01/18/20 1922   29-May-2019 1830  remdesivir 200 mg in sodium chloride 0.9% 250 mL IVPB       "Followed by" Linked Group Details   29-May-2019 1727 29-May-2019 1915       Devices    LINES / TUBES:      Continuous Infusions: . dexmedetomidine (PRECEDEX) IV infusion 0.4 mcg/kg/hr (01/31/20 0537)  . dextrose 30 mL/hr at 01/31/20 0212  . feeding supplement (OSMOLITE 1.5 CAL)       Objective: Vitals:   01/31/20 0500 01/31/20 0534 01/31/20 0700 01/31/20 0755  BP:  110/66 (!) 110/57   Pulse:  93 85 (!) 121  Resp:  (!) 22 (!) 25 (!) 25  Temp:      TempSrc:      SpO2:  (!) 88% (!) 89% (!) 89%  Weight: 109.8 kg     Height:        Intake/Output Summary (Last 24 hours) at 01/31/2020 0757 Last data filed at 01/31/2020 0535 Gross per 24 hour  Intake 1026.36 ml  Output 550 ml  Net 476.36 ml   Filed Weights   01/29/20 0500 01/30/20 0500 01/31/20 0500  Weight: 109.6 kg 109.1 kg 109.8 kg   Physical Exam:  General:  A/O x2 (did not know when, why, where) but did know she was in the hospital. Positive acute respiratory distress Eyes: negative scleral hemorrhage, negative anisocoria, negative icterus ENT: Negative Runny nose, negative gingival bleeding, Neck:  Negative scars, masses, torticollis, lymphadenopathy, JVD Lungs: decreased breath sounds bilaterally without wheezes or crackles Cardiovascular: Regular rate and rhythm without murmur gallop or rub normal S1 and S2 Abdomen: negative abdominal pain, nondistended, positive soft, bowel sounds,  no rebound, no ascites, no appreciable mass Extremities: No significant cyanosis, clubbing, or edema bilateral lower extremities Skin: Negative rashes, lesions, ulcers Psychiatric: Unable to fully evaluate secondary to encephalopathy Central nervous system:  Cranial nerves II through XII intact, tongue/uvula midline, all extremities muscle strength 2-3/5, sensation intact throughout, positive dysarthria, follows some commands   .     Data Reviewed: Care during the described time interval was provided by me .  I have reviewed this patient's available data, including medical history, events of note, physical examination, and all test results as part of my evaluation.   CBC: Recent Labs  Lab 01/26/20 0258 01/27/20 0315 01/29/20 0240 01/30/20 0222 01/31/20 0239  WBC 19.4* 24.6* 26.1* 34.0* 29.2*  NEUTROABS  --   --   --  30.6* 24.1*  HGB 10.5* 11.7* 12.1 11.2* 11.0*  HCT 32.5* 35.6* 37.1 35.0* 35.0*  MCV 95.0 93.7 93.5 95.4 95.9  PLT 278 264 209 176 152   Basic Metabolic Panel: Recent Labs  Lab 01/25/20 0244 01/25/20 0244 01/26/20 0258 01/27/20 0315 01/29/20 0240 01/30/20 0222 01/31/20 0239  NA 152*   < > 144 141 138 136 139  K 4.2   < > 4.0 4.7 3.5 4.2 3.9  CL 103   < > 101 100 100 100 103  CO2 38*   < > 35* 32 30 25 23   GLUCOSE 182*   < > 165* 171* 117* 124* 118*  BUN 40*   < > 31* 24* 23 23 28*  CREATININE 0.73   < > 0.56 0.58 0.68 0.88 0.98  CALCIUM 9.0   < > 8.5* 8.6* 8.4* 8.4* 8.4*  MG 2.6*  --   --  2.3 2.1 2.2 2.2  PHOS  --   --   --   --   --  2.8 3.7   < > = values in this interval not displayed.   GFR: Estimated Creatinine Clearance: 73 mL/min (by C-G formula based on SCr of 0.98 mg/dL). Liver Function Tests: Recent Labs  Lab 01/26/20 0258 01/27/20 0315 01/29/20 0240 01/30/20 0222 01/31/20 0239  AST 61* 53* 25 21 23   ALT 127* 149* 88* 67* 59*  ALKPHOS 50 59 59 61 65  BILITOT 0.7 0.8 0.7 1.2 0.7  PROT 5.1* 5.6* 5.7* 5.5* 5.8*  ALBUMIN 2.5* 2.7*  2.6* 2.4* 2.5*   No results for input(s): LIPASE, AMYLASE in the last 168 hours. No results for input(s): AMMONIA in the last 168 hours. Coagulation Profile: No results for input(s): INR, PROTIME in the last 168 hours. Cardiac Enzymes: No results for input(s): CKTOTAL, CKMB, CKMBINDEX, TROPONINI in the last 168 hours. BNP (last 3 results) No results for input(s): PROBNP in the last 8760 hours. HbA1C: No results for input(s): HGBA1C in the last 72 hours. CBG: Recent Labs  Lab 01/30/20 0838 01/30/20 1224 01/30/20 1734 01/30/20 1904 01/30/20 2303  GLUCAP 179* 218* 133* 120* 119*   Lipid Profile: No results for input(s): CHOL, HDL, LDLCALC, TRIG, CHOLHDL, LDLDIRECT in the last 72 hours. Thyroid Function Tests: No results for input(s): TSH, T4TOTAL, FREET4, T3FREE, THYROIDAB in the last 72 hours. Anemia Panel: Recent Labs    01/30/20 0222 01/31/20 0239  FERRITIN 830* 831*   Urine analysis:    Component Value Date/Time   COLORURINE YELLOW 01/07/2020 1819   APPEARANCEUR TURBID (A) 01/07/2020 1819   LABSPEC 1.018 01/07/2020 1819   PHURINE 5.0 01/07/2020 1819   GLUCOSEU NEGATIVE 01/07/2020 1819   HGBUR MODERATE (A) 01/07/2020 1819   BILIRUBINUR NEGATIVE 01/07/2020 1819   KETONESUR NEGATIVE 01/07/2020 1819   PROTEINUR >=300 (A) 01/07/2020 1819   NITRITE POSITIVE (A) 01/07/2020 1819   LEUKOCYTESUR TRACE (A) 01/07/2020 1819   Sepsis Labs: @LABRCNTIP (procalcitonin:4,lacticidven:4)  ) No results found for this or any previous visit (from the past 240 hour(s)).       Radiology Studies: DG Abd 1 View  Result Date: 01/30/2020 CLINICAL DATA:  Feeding tube placement EXAM: ABDOMEN - 1 VIEW COMPARISON:  01/29/2020 FINDINGS: Feeding tube is seen in the upper neck in the region of the oropharynx. Extensive bilateral airspace opacities throughout the lungs. Nonobstructive bowel gas pattern. IMPRESSION: Feeding tube seen in the oropharyngeal region. Extensive bilateral airspace  disease, stable.  Electronically Signed   By: Charlett Nose M.D.   On: 01/30/2020 12:41   DG Abd 1 View  Result Date: 01/29/2020 CLINICAL DATA:  Check feeding catheter placement EXAM: ABDOMEN - 1 VIEW COMPARISON:  CT from 01/16/2020 FINDINGS: Feeding catheter is noted coiled over the lower left chest lying within the gastric fundus which is within a hiatal hernia. IMPRESSION: Feeding catheter coiled within the fundus of the stomach in a hiatal hernia. Electronically Signed   By: Alcide Clever M.D.   On: 01/29/2020 13:45   DG INTRO LONG GI TUBE  Result Date: 01/30/2020 CLINICAL DATA:  Feeding tube placement EXAM: FL FEEDING TUBE PLACEMENT FLUOROSCOPY TIME:  Fluoroscopy Time:  8 minutes 12 seconds Radiation Exposure Index (if provided by the fluoroscopic device): Number of Acquired Spot Images: 0 COMPARISON:  None. FINDINGS: Fluoroscopic guidance was utilized to advance the feeding tube from the oropharynx into the esophagus. Feeding tube coiled in the hiatal hernia. Multiple attempts were made to pass the feeding tube through the hiatal hernia unsuccessfully. This included multiple different guiding wires. Finally, after numerous unsuccessful attempts, the feeding tube was looped in the hiatal hernia and secured to the nose. IMPRESSION: Unsuccessful attempts at advancing the feeding tube through the hiatal hernia despite multiple attempts with different guiding wires. Electronically Signed   By: Charlett Nose M.D.   On: 01/30/2020 16:57        Scheduled Meds: . chlorhexidine  15 mL Mouth Rinse BID  . Chlorhexidine Gluconate Cloth  6 each Topical Daily  . enoxaparin (LOVENOX) injection  60 mg Subcutaneous Q24H  . feeding supplement (PROSource TF)  45 mL Per Tube TID  . free water  200 mL Per Tube Q4H  . insulin aspart  0-9 Units Subcutaneous TID WC  . mouth rinse  15 mL Mouth Rinse BID  . methylPREDNISolone (SOLU-MEDROL) injection  20 mg Intravenous Q24H  . nystatin   Topical BID   Continuous  Infusions: . dexmedetomidine (PRECEDEX) IV infusion 0.4 mcg/kg/hr (01/31/20 0537)  . dextrose 30 mL/hr at 01/31/20 0212  . feeding supplement (OSMOLITE 1.5 CAL)       LOS: 17 days   The patient is critically ill with multiple organ systems failure and requires high complexity decision making for assessment and support, frequent evaluation and titration of therapies, application of advanced monitoring technologies and extensive interpretation of multiple databases. Critical Care Time devoted to patient care services described in this note  Time spent: 40 minutes     Nelline Lio, Roselind Messier, MD [facility] Pager [phone]  If 7PM-7AM, please contact night-coverage [email or web address] Password [facility] 01/31/2020, 7:57 AM

## 2020-01-31 ENCOUNTER — Inpatient Hospital Stay (HOSPITAL_COMMUNITY): Payer: BC Managed Care – PPO

## 2020-01-31 DIAGNOSIS — E441 Mild protein-calorie malnutrition: Secondary | ICD-10-CM

## 2020-01-31 DIAGNOSIS — J159 Unspecified bacterial pneumonia: Secondary | ICD-10-CM | POA: Diagnosis present

## 2020-01-31 DIAGNOSIS — R7401 Elevation of levels of liver transaminase levels: Secondary | ICD-10-CM | POA: Diagnosis present

## 2020-01-31 LAB — CBC WITH DIFFERENTIAL/PLATELET
Abs Immature Granulocytes: 1.84 10*3/uL — ABNORMAL HIGH (ref 0.00–0.07)
Basophils Absolute: 0.2 10*3/uL — ABNORMAL HIGH (ref 0.0–0.1)
Basophils Relative: 1 %
Eosinophils Absolute: 0.1 10*3/uL (ref 0.0–0.5)
Eosinophils Relative: 0 %
HCT: 35 % — ABNORMAL LOW (ref 36.0–46.0)
Hemoglobin: 11 g/dL — ABNORMAL LOW (ref 12.0–15.0)
Immature Granulocytes: 6 %
Lymphocytes Relative: 4 %
Lymphs Abs: 1.1 10*3/uL (ref 0.7–4.0)
MCH: 30.1 pg (ref 26.0–34.0)
MCHC: 31.4 g/dL (ref 30.0–36.0)
MCV: 95.9 fL (ref 80.0–100.0)
Monocytes Absolute: 1.9 10*3/uL — ABNORMAL HIGH (ref 0.1–1.0)
Monocytes Relative: 7 %
Neutro Abs: 24.1 10*3/uL — ABNORMAL HIGH (ref 1.7–7.7)
Neutrophils Relative %: 82 %
Platelets: 152 10*3/uL (ref 150–400)
RBC: 3.65 MIL/uL — ABNORMAL LOW (ref 3.87–5.11)
RDW: 14.3 % (ref 11.5–15.5)
WBC: 29.2 10*3/uL — ABNORMAL HIGH (ref 4.0–10.5)
nRBC: 0.1 % (ref 0.0–0.2)

## 2020-01-31 LAB — COMPREHENSIVE METABOLIC PANEL
ALT: 59 U/L — ABNORMAL HIGH (ref 0–44)
AST: 23 U/L (ref 15–41)
Albumin: 2.5 g/dL — ABNORMAL LOW (ref 3.5–5.0)
Alkaline Phosphatase: 65 U/L (ref 38–126)
Anion gap: 13 (ref 5–15)
BUN: 28 mg/dL — ABNORMAL HIGH (ref 8–23)
CO2: 23 mmol/L (ref 22–32)
Calcium: 8.4 mg/dL — ABNORMAL LOW (ref 8.9–10.3)
Chloride: 103 mmol/L (ref 98–111)
Creatinine, Ser: 0.98 mg/dL (ref 0.44–1.00)
GFR calc Af Amer: >60 mL/min (ref >60)
GFR calc non Af Amer: >60 mL/min (ref >60)
Glucose, Bld: 118 mg/dL — ABNORMAL HIGH (ref 70–99)
Potassium: 3.9 mmol/L (ref 3.5–5.1)
Sodium: 139 mmol/L (ref 135–145)
Total Bilirubin: 0.7 mg/dL (ref 0.3–1.2)
Total Protein: 5.8 g/dL — ABNORMAL LOW (ref 6.5–8.1)

## 2020-01-31 LAB — C-REACTIVE PROTEIN: CRP: 8.3 mg/dL — ABNORMAL HIGH (ref <1.0)

## 2020-01-31 LAB — PHOSPHORUS: Phosphorus: 3.7 mg/dL (ref 2.5–4.6)

## 2020-01-31 LAB — GLUCOSE, CAPILLARY
Glucose-Capillary: 160 mg/dL — ABNORMAL HIGH (ref 70–99)
Glucose-Capillary: 164 mg/dL — ABNORMAL HIGH (ref 70–99)
Glucose-Capillary: 137 mg/dL — ABNORMAL HIGH (ref 70–99)
Glucose-Capillary: 147 mg/dL — ABNORMAL HIGH (ref 70–99)

## 2020-01-31 LAB — FERRITIN: Ferritin: 831 ng/mL — ABNORMAL HIGH (ref 11–307)

## 2020-01-31 LAB — LACTATE DEHYDROGENASE: LDH: 277 U/L — ABNORMAL HIGH (ref 98–192)

## 2020-01-31 LAB — MAGNESIUM: Magnesium: 2.2 mg/dL (ref 1.7–2.4)

## 2020-01-31 LAB — D-DIMER, QUANTITATIVE: D-Dimer, Quant: 0.93 ug/mL-FEU — ABNORMAL HIGH (ref 0.00–0.50)

## 2020-01-31 MED ORDER — OSMOLITE 1.5 CAL PO LIQD
1000.0000 mL | ORAL | Status: DC
  Administered 2020-02-02: 1000 mL
  Filled 2020-01-31 (×2): qty 1000

## 2020-01-31 MED ORDER — QUETIAPINE FUMARATE 50 MG PO TABS
25.0000 mg | ORAL_TABLET | Freq: Every day | ORAL | Status: DC
  Administered 2020-01-31 – 2020-02-04 (×5): 25 mg
  Filled 2020-01-31 (×5): qty 1

## 2020-01-31 MED ORDER — SODIUM CHLORIDE 0.9 % IV SOLN
INTRAVENOUS | Status: DC | PRN
  Administered 2020-01-31: 250 mL via INTRAVENOUS
  Administered 2020-02-03: 1000 mL via INTRAVENOUS
  Administered 2020-02-06: 250 mL via INTRAVENOUS

## 2020-01-31 MED ORDER — DEXMEDETOMIDINE HCL IN NACL 200 MCG/50ML IV SOLN
0.2000 ug/kg/h | INTRAVENOUS | Status: DC
  Administered 2020-01-31: 0.6 ug/kg/h via INTRAVENOUS
  Administered 2020-01-31: 0.8 ug/kg/h via INTRAVENOUS
  Administered 2020-01-31: 0.7 ug/kg/h via INTRAVENOUS
  Administered 2020-01-31: 0.6 ug/kg/h via INTRAVENOUS
  Administered 2020-01-31: 0.8 ug/kg/h via INTRAVENOUS
  Administered 2020-01-31: 0.7 ug/kg/h via INTRAVENOUS
  Administered 2020-01-31: 0.8 ug/kg/h via INTRAVENOUS
  Administered 2020-02-01: 0.2 ug/kg/h via INTRAVENOUS
  Administered 2020-02-01: 0.3 ug/kg/h via INTRAVENOUS
  Administered 2020-02-01: 0.5 ug/kg/h via INTRAVENOUS
  Administered 2020-02-02: 0.6 ug/kg/h via INTRAVENOUS
  Administered 2020-02-02: 0.2 ug/kg/h via INTRAVENOUS
  Filled 2020-01-31 (×10): qty 50

## 2020-01-31 NOTE — Evaluation (Signed)
Occupational Therapy Re-Evaluation Patient Details Name: Julie Mcguire MRN: [id number] DOB: 28-Feb-1956 Today's Date: 01/31/2020    History of Present Illness Julie Mcguire is a 64 y.o. female with medical history significant of recent Covid pneumonia, hypertension, moderate obesity who presented to the emergency room with complaints of worsening shortness of breath.  Recently discharged from the hospital on 01/10/2020 following treatment for Covid-19 viral pneumonia.She was discharged on 4 L of oxygen and returned 8/31 with worsening short of breath. Has required Precedex for agitation, restraints for safety and continues use of HFNC and NRB to maintain sats.   Clinical Impression   Julie Mcguire is a 64 year old woman who is on day 35 of hospital admission. Initial OT evaluation completed on 01/17/20. Patient required transfer to ICU, high level of oxygen and interventions including medication and restraints to keep her safe secondary to agitation. Today patient is out of isolation, in restraints, on 30L heated HFNC and NRB and in the presence of her husband. Patient presents with generalized weakness, decreased activity tolerance, poor cardiopulmonary endurance, decreased balance, and impaired cognition and safety awareness resulting in a significant decline in ability to perform functional mobility and ADLs. Patient required max x 2 to transfer to side of bed, unable to come into standing and total assist to return safely to bed. Patient became agitated and hyper focused on going home today and HR elevated into 150s with patient becoming very anxious and not able to be reasoned with and not following directions well. Patient returned to supine and restraints replaced. Patient is NPO and total care for ADLs at this time. Patient's o2 sats maintained between 85-95% on current level of oxygen. Patient will benefit from skilled OT services to improve deficits and learn compensatory strategies as  needed in order to improve functional abilities. Short term rehab recommended at discharge.    Follow Up Recommendations  SNF    Equipment Recommendations   (Defer to next venue)    Recommendations for Other Services       Precautions / Restrictions Precautions Precautions: Fall Precaution Comments: temp feeding tube, Monitor O2 VS, can became anxious and fixated, +2 for safety to control behaviors Restrictions Weight Bearing Restrictions: No      Mobility Bed Mobility Overal bed mobility: Needs Assistance Bed Mobility: Rolling;Sidelying to Sit;Sit to Supine Rolling: Max assist;+2 for physical assistance;+2 for safety/equipment Sidelying to sit: Total assist;+2 for physical assistance;+2 for safety/equipment;HOB elevated   Sit to supine: Total assist;+2 for physical assistance;+2 for safety/equipment Sit to sidelying: Supervision General bed mobility comments: Pt required Max assist to facilitate/initiate roll to Rt side with reaching Lt UE and Max assist for bringing LE's off EOB. Total Assist required to return to supine for controled trunk lowering and raiseing LE's into bed.  Transfers Overall transfer level: Needs assistance Equipment used: Rolling walker (2 wheeled);2 person hand held assist Transfers: Sit to/from Stand Sit to Stand: Max assist;Total assist;+2 safety/equipment;+2 physical assistance         General transfer comment: Attempted sit<>stand with bed slightly elevated. Pt required cues for safe hand placement/technique with RW for power up. Patient became anxious and aggitated wanting to be allowed to leave hospital. HR elevated and Max of 150 reached. Pt assisted back to bed.     Balance Overall balance assessment: Needs assistance Sitting-balance support: Feet supported;Bilateral upper extremity supported Sitting balance-Leahy Scale: Poor       Standing balance-Leahy Scale: Zero Standing balance comment: unable to fully rise  to stand today                            ADL either performed or assessed with clinical judgement   ADL Overall ADL's : Needs assistance/impaired Eating/Feeding: NPO   Grooming: Maximal assistance;Bed level   Upper Body Bathing: Total assistance;Bed level   Lower Body Bathing: Total assistance;Bed level   Upper Body Dressing : Total assistance;Bed level   Lower Body Dressing: Total assistance;Bed level     Toilet Transfer Details (indicate cue type and reason): unable Toileting- Clothing Manipulation and Hygiene: Total assistance;Bed level               Vision   Vision Assessment?: No apparent visual deficits     Perception     Praxis      Pertinent Vitals/Pain Pain Assessment: No/denies pain Faces Pain Scale: No hurt Pain Intervention(s): Monitored during session;Repositioned (pt anxoius at time but does not appear in pain)     Hand Dominance Right   Extremity/Trunk Assessment Upper Extremity Assessment Upper Extremity Assessment: Generalized weakness;RUE deficits/detail;LUE deficits/detail RUE Deficits / Details: Reports hx of decreased shoulder ROM on right   Lower Extremity Assessment Lower Extremity Assessment: Defer to PT evaluation RLE Deficits / Details: pt unable to flex hip/knee in supine, some activation to slide LE's laterally to EOB, pt able to perform LAQ sitting through part ROM. 3-/5 or less for LE throughout. LLE Deficits / Details: pt unable to flex hip/knee in supine, some activation to slide LE's laterally to EOB, pt able to perform LAQ sitting through part ROM. 3-/5 or less for LE throughout.   Cervical / Trunk Assessment Cervical / Trunk Assessment: Normal;Other exceptions Cervical / Trunk Exceptions: body habitus   Communication Communication Communication: No difficulties   Cognition Arousal/Alertness: Awake/alert Behavior During Therapy: Anxious Overall Cognitive Status: Impaired/Different from baseline Area of Impairment:  Safety/judgement;Awareness;Following commands;Attention                               General Comments: pt oriented to self, place, not fully to situation. pt anxious at time and trying to bargain throughout session to postpone mobility and then to be able to discharge home. "If I stand up then you'll let me go....go home". Pt then becoming more anxious with discussion of rehab needs and HR elevated. Pt agitated and with assist to return to be repeated "help me" "Julie Mcguire where are you". Pt's husband Julie Mcguire at bedside and present throughout session.   General Comments  HR in 80s-90s at rest. Elevated to 120's with bed mobility and stable sitting EOB. Pt HR incresed as she became emotional about discahrging reaching max of 150bpm and returning to 120-130 in supine and 90's after 2-3 minutes rest. SpO2 stable throughout with low ~85% and increase to 94% with prob moved from finger to ear.     Exercises     Shoulder Instructions      Home Living Family/patient expects to be discharged to:: Private residence Living Arrangements: Spouse/significant other;Children Available Help at Discharge: Family;Available 24 hours/day Type of Home: House Home Access: Stairs to enter [employer] of Steps: 1   Home Layout: One level     Bathroom Shower/Tub: Producer, television/film/video: Standard (reports one bathroom has a higher toilet) Bathroom Accessibility: Yes   Home Equipment: Walker - 2 wheels  Prior Functioning/Environment Level of Independence: Needs assistance  Gait / Transfers Assistance Needed: Independent prior to COVID. On discharge walking without walker, short distance to bathroom, reported it was difficult. Predominantly residing in Child psychotherapist bedroom. ADL's / Homemaking Assistance Needed: Independent prior to COVID, recently retired. Reports needing assistance with ADLs due to difficulty since discharge.            OT Problem List: Decreased activity  tolerance;Decreased strength;Decreased knowledge of use of DME or AE;Cardiopulmonary status limiting activity;Obesity;Decreased range of motion;Impaired balance (sitting and/or standing);Decreased cognition;Decreased safety awareness      OT Treatment/Interventions: Self-care/ADL training;Therapeutic exercise;Energy conservation;DME and/or AE instruction;Therapeutic activities;Patient/family education    OT Goals(Current goals can be found in the care plan section) Acute Rehab OT Goals Patient Stated Goal: To improve mobility and strength OT Goal Formulation: With patient/family Time For Goal Achievement: 02/14/20 Potential to Achieve Goals: Fair  OT Frequency: Min 2X/week   Barriers to D/C:            Co-evaluation PT/OT/SLP Co-Evaluation/Treatment: Yes Reason for Co-Treatment: Necessary to address cognition/behavior during functional activity;For patient/therapist safety;To address functional/ADL transfers   OT goals addressed during session: ADL's and self-care;Strengthening/ROM (functional mobility, activity tolerance)      AM-PAC OT "6 Clicks" Daily Activity     Outcome Measure Help from another person eating meals?: Total Help from another person taking care of personal grooming?: A Lot Help from another person toileting, which includes using toliet, bedpan, or urinal?: Total Help from another person bathing (including washing, rinsing, drying)?: Total Help from another person to put on and taking off regular upper body clothing?: Total Help from another person to put on and taking off regular lower body clothing?: Total 6 Click Score: 7   End of Session Equipment Utilized During Treatment: Rolling walker;Oxygen Nurse Communication: Mobility status  Activity Tolerance: Treatment limited secondary to agitation Patient left: in bed;with call bell/phone within reach;with bed alarm set  OT Visit Diagnosis: Muscle weakness (generalized) (M62.81);Other symptoms and signs  involving cognitive function                Time: 1200-1227 OT Time Calculation (min): 27 min Charges:  OT General Charges $OT Visit: 1 Visit OT Evaluation $OT Re-eval: 1 Re-eval  Enedelia Martorelli, OTR/L Acute Care Rehab Services  Office [phone] Pager: [phone]   Kelli Churn 01/31/2020, 2:00 PM

## 2020-01-31 NOTE — Progress Notes (Addendum)
BSE completed, full report to follow.  Pt alert, cooperative but also did not follow directions to cease talking during entire session.  She is also in restraints, on 30L heated HFNC and NRB.   CN exam unremarkable.  SLP provided her with oral moisture and cued her to swallow moisture however pt did not despite her verbalizing "I have".   SLP provided her with small amounts of strawberry icee *1/8 tsp.  Swallow was mildly delayed and multiple swallows observed.  No indication of aspiration, however pt conducts deep inhalation after swallow which may allow aspiration and given her tenuous respiratory status, cognitive status, would not recommend po diet at this time.  Rolena Infante, MS [facility] SLP Acute Rehab Services Office [phone] .

## 2020-01-31 NOTE — Evaluation (Addendum)
SLP Cancellation Note  Patient Details Name: MONIC ENGELMANN MRN: [id number] DOB: 17-Jan-1956   Cancelled treatment:       Reason Eval/Treat Not Completed: Other (comment);Medical issues which prohibited therapy (received order for swallow evaluation, at this time, pt is on HFNC and NRB and she had a temporary feeding tube placed yesterday which per imaging is in the stomach today, will follow up next date to determine potential readiness for po)  Thank you for this consult.   Rolena Infante, MS [facility] SLP Acute Rehab Services Office [phone]   Chales Abrahams 01/31/2020, 12:24 PM

## 2020-01-31 NOTE — Progress Notes (Signed)
PROGRESS NOTE    Julie Mcguire  NLZ:[id number] DOB: 1955/07/30 DOA: 12/20/2019 PCP: Elias Else, MD   Brief Narrative:  [phone] PMHx HTN, and obesity  Recent COVID pneumonia,  who returned to the ED with worsening shortness of breath. She was discharged from the hospital 8/27 following treatment for Covid pneumonia with steroids, remdesivir, and baricitinib.  At the time of discharge she required 4 L oxygen support. When EMS arrived at her home she was found to be saturating 75% on 6 L of nasal cannula and required 15 L via nonrebreather mask to get saturations up to 85%. In the ED CXR noted moderate to severe bilateral patchy infiltrates and the patient was readmitted to the hospital with refractory Covid pneumonia.   Subjective: 9/17 afebrile overnight patient awake babbling incoherently talking about a block that is made of Granite   Assessment & Plan:  Covid vaccination;  Principal Problem:   Acute respiratory failure with hypoxia (HCC) Active Problems:   Pneumonia due to COVID-19 virus   Essential hypertension   Obesity, Class III, BMI 40-49.9 (morbid obesity) (HCC)   COVID-19   SIRS (systemic inflammatory response syndrome) (HCC)   Goals of care, counseling/discussion   Palliative care by specialist   DNR (do not resuscitate) discussion   Secondary bacterial pneumonia   Hypokalemia   Toxic metabolic encephalopathy   Bacterial pneumonia   Transaminitis   Acute respiratory failure with hypoxia- COVID Pneumonia  Recent Labs    01/29/20 0240 01/30/20 0222 01/31/20 0239  DDIMER 0.90* 0.81* 0.93*  FERRITIN  --  830* 831*  LDH  --  276* 277*  CRP  --  10.9* 8.3*    Lab Results  Component Value Date   SARSCOV2NAA POSITIVE (A) 01/07/2020   -Solu-Medrol 20 mg daily 8/24 > 8/27 + 8/31 > -Baricitinib 8/24 > 8/27 + 8/31 > 9/9 -Remdesivir 8/24 > 8/27 + 8/31 > 9/4 -9/17 PCXR pending  Possible secondary bacterial pneumonia -Procalcitonin not convincing of  bacterial pneumonia  -discontinued empiric antibiotics after 3 complete days of treatment  Toxic metabolic encephalopathy -CT head without acute findings -9/15 most likely secondary to sedating medication (Precedex) and acute delirium of prolonged critical illness. -9/15 attempting to wean patient off Precedex but patient becomes agitated and combative. -Still unable to place NG tube, therefore will not be able to use Seroquel, as medication for agitation in order to wean Precedex off. -9/15 obtain EKG and if no QT prolongation will start Haldol and attempt to wean Precedex. -9/16 patient's encephalopathy appears to be improving.  We will hold on starting Haldol, patient's family is supposed to be visiting today we will see if her cognition improves hopefully will be able to wean Precedex without adding any additional agents.. -9/17 Seroquel 25 mg QHS  Nutrition -Bedside placement appended to x3 have failed. -IR placement of NG tube cannot be accomplished secondary to hardware failure.  Hopefully by 9/16 there hardware will be repaired. -D5W 73ml/hr -9/17 IR to place NG tube; proximal stomach -9/17 trickle feeds only, to ensure no aspiration.  Aspiration precautions -Maintain head of bed> 30 degrees at all times  Transaminitis -Resolved except for ALT still mildly elevated   Hypokalemia -Potassium goal> 4  DVT prophylaxis: Lovenox Code Status: Full Family Communication:  Status is: Inpatient    Dispo: The patient is from: Home              Anticipated d/c is to: Home  Anticipated d/c date is:??              Patient currently severely unstable      Consultants:  PCCM   Procedures/Significant Events:  8/24 initial admission to hospital - steroids, remdesivir, and baricitinib 8/27 discharge home after treatment for Covid pneumonia 8/31 readmit for refractory Covid pneumonia   I have personally reviewed and interpreted all radiology studies and my  findings are as above.  VENTILATOR SETTINGS: HHFNC +NRB 9/17 FiO2; 70% Flow;25 L/min SPO2 93%   Cultures   Antimicrobials: Anti-infectives (From admission, onward)   Start     Ordered Stop   01/20/20 1130  cefTRIAXone (ROCEPHIN) 2 g in sodium chloride 0.9 % 100 mL IVPB  Status:  Discontinued        01/20/20 1042 01/22/20 1634   01/20/20 1130  azithromycin (ZITHROMAX) 500 mg in sodium chloride 0.9 % 250 mL IVPB  Status:  Discontinued        01/20/20 1042 01/22/20 1634   01/15/20 1000  remdesivir 100 mg in sodium chloride 0.9 % 100 mL IVPB       "Followed by" Linked Group Details   01-16-2020 1727 01/18/20 1922   01-16-2020 1830  remdesivir 200 mg in sodium chloride 0.9% 250 mL IVPB       "Followed by" Linked Group Details   01/16/20 1727 January 16, 2020 1915       Devices    LINES / TUBES:      Continuous Infusions: . dexmedetomidine (PRECEDEX) IV infusion 0.4 mcg/kg/hr (01/31/20 0537)  . dextrose 30 mL/hr at 01/31/20 0212  . feeding supplement (OSMOLITE 1.5 CAL)       Objective: Vitals:   01/31/20 0500 01/31/20 0534 01/31/20 0700 01/31/20 0755  BP:  110/66 (!) 110/57   Pulse:  93 85 (!) 121  Resp:  (!) 22 (!) 25 (!) 25  Temp:      TempSrc:      SpO2:  (!) 88% (!) 89% (!) 89%  Weight: 109.8 kg     Height:        Intake/Output Summary (Last 24 hours) at 01/31/2020 0758 Last data filed at 01/31/2020 0535 Gross per 24 hour  Intake 1026.36 ml  Output 550 ml  Net 476.36 ml   Filed Weights   01/29/20 0500 01/30/20 0500 01/31/20 0500  Weight: 109.6 kg 109.1 kg 109.8 kg   Physical Exam:  General: Babbling incoherently, positive acute respiratory distress Eyes: negative scleral hemorrhage, negative anisocoria, negative icterus ENT: Negative Runny nose, negative gingival bleeding, Neck:  Negative scars, masses, torticollis, lymphadenopathy, JVD Lungs: decreased breath sounds bilaterally without wheezes or crackles Cardiovascular: Regular rate and rhythm without  murmur gallop or rub normal S1 and S2 Abdomen: MORBIDLY OBESE, negative abdominal pain, nondistended, positive soft, bowel sounds, no rebound, no ascites, no appreciable mass Extremities: No significant cyanosis, clubbing, or edema bilateral lower extremities Skin: Negative rashes, lesions, ulcers Psychiatric: Patient confused unable to assess secondary to encephalopathy Central nervous system: Does not follow commands babbling incoherently     .     Data Reviewed: Care during the described time interval was provided by me .  I have reviewed this patient's available data, including medical history, events of note, physical examination, and all test results as part of my evaluation.   CBC: Recent Labs  Lab 01/26/20 0258 01/27/20 0315 01/29/20 0240 01/30/20 0222 01/31/20 0239  WBC 19.4* 24.6* 26.1* 34.0* 29.2*  NEUTROABS  --   --   --  30.6* 24.1*  HGB 10.5* 11.7* 12.1 11.2* 11.0*  HCT 32.5* 35.6* 37.1 35.0* 35.0*  MCV 95.0 93.7 93.5 95.4 95.9  PLT 278 264 209 176 152   Basic Metabolic Panel: Recent Labs  Lab 01/25/20 0244 01/25/20 0244 01/26/20 0258 01/27/20 0315 01/29/20 0240 01/30/20 0222 01/31/20 0239  NA 152*   < > 144 141 138 136 139  K 4.2   < > 4.0 4.7 3.5 4.2 3.9  CL 103   < > 101 100 100 100 103  CO2 38*   < > 35* 32 30 25 23   GLUCOSE 182*   < > 165* 171* 117* 124* 118*  BUN 40*   < > 31* 24* 23 23 28*  CREATININE 0.73   < > 0.56 0.58 0.68 0.88 0.98  CALCIUM 9.0   < > 8.5* 8.6* 8.4* 8.4* 8.4*  MG 2.6*  --   --  2.3 2.1 2.2 2.2  PHOS  --   --   --   --   --  2.8 3.7   < > = values in this interval not displayed.   GFR: Estimated Creatinine Clearance: 73 mL/min (by C-G formula based on SCr of 0.98 mg/dL). Liver Function Tests: Recent Labs  Lab 01/26/20 0258 01/27/20 0315 01/29/20 0240 01/30/20 0222 01/31/20 0239  AST 61* 53* 25 21 23   ALT 127* 149* 88* 67* 59*  ALKPHOS 50 59 59 61 65  BILITOT 0.7 0.8 0.7 1.2 0.7  PROT 5.1* 5.6* 5.7* 5.5* 5.8*    ALBUMIN 2.5* 2.7* 2.6* 2.4* 2.5*   No results for input(s): LIPASE, AMYLASE in the last 168 hours. No results for input(s): AMMONIA in the last 168 hours. Coagulation Profile: No results for input(s): INR, PROTIME in the last 168 hours. Cardiac Enzymes: No results for input(s): CKTOTAL, CKMB, CKMBINDEX, TROPONINI in the last 168 hours. BNP (last 3 results) No results for input(s): PROBNP in the last 8760 hours. HbA1C: No results for input(s): HGBA1C in the last 72 hours. CBG: Recent Labs  Lab 01/30/20 0838 01/30/20 1224 01/30/20 1734 01/30/20 1904 01/30/20 2303  GLUCAP 179* 218* 133* 120* 119*   Lipid Profile: No results for input(s): CHOL, HDL, LDLCALC, TRIG, CHOLHDL, LDLDIRECT in the last 72 hours. Thyroid Function Tests: No results for input(s): TSH, T4TOTAL, FREET4, T3FREE, THYROIDAB in the last 72 hours. Anemia Panel: Recent Labs    01/30/20 0222 01/31/20 0239  FERRITIN 830* 831*   Urine analysis:    Component Value Date/Time   COLORURINE YELLOW 01/07/2020 1819   APPEARANCEUR TURBID (A) 01/07/2020 1819   LABSPEC 1.018 01/07/2020 1819   PHURINE 5.0 01/07/2020 1819   GLUCOSEU NEGATIVE 01/07/2020 1819   HGBUR MODERATE (A) 01/07/2020 1819   BILIRUBINUR NEGATIVE 01/07/2020 1819   KETONESUR NEGATIVE 01/07/2020 1819   PROTEINUR >=300 (A) 01/07/2020 1819   NITRITE POSITIVE (A) 01/07/2020 1819   LEUKOCYTESUR TRACE (A) 01/07/2020 1819   Sepsis Labs: @LABRCNTIP (procalcitonin:4,lacticidven:4)  ) No results found for this or any previous visit (from the past 240 hour(s)).       Radiology Studies: DG Abd 1 View  Result Date: 01/30/2020 CLINICAL DATA:  Feeding tube placement EXAM: ABDOMEN - 1 VIEW COMPARISON:  01/29/2020 FINDINGS: Feeding tube is seen in the upper neck in the region of the oropharynx. Extensive bilateral airspace opacities throughout the lungs. Nonobstructive bowel gas pattern. IMPRESSION: Feeding tube seen in the oropharyngeal region. Extensive  bilateral airspace disease, stable. Electronically Signed   By: Charlett NoseKevin  Dover M.D.  On: 01/30/2020 12:41   DG Abd 1 View  Result Date: 01/29/2020 CLINICAL DATA:  Check feeding catheter placement EXAM: ABDOMEN - 1 VIEW COMPARISON:  CT from 01/16/2020 FINDINGS: Feeding catheter is noted coiled over the lower left chest lying within the gastric fundus which is within a hiatal hernia. IMPRESSION: Feeding catheter coiled within the fundus of the stomach in a hiatal hernia. Electronically Signed   By: Alcide Clever M.D.   On: 01/29/2020 13:45   DG INTRO LONG GI TUBE  Result Date: 01/30/2020 CLINICAL DATA:  Feeding tube placement EXAM: FL FEEDING TUBE PLACEMENT FLUOROSCOPY TIME:  Fluoroscopy Time:  8 minutes 12 seconds Radiation Exposure Index (if provided by the fluoroscopic device): Number of Acquired Spot Images: 0 COMPARISON:  None. FINDINGS: Fluoroscopic guidance was utilized to advance the feeding tube from the oropharynx into the esophagus. Feeding tube coiled in the hiatal hernia. Multiple attempts were made to pass the feeding tube through the hiatal hernia unsuccessfully. This included multiple different guiding wires. Finally, after numerous unsuccessful attempts, the feeding tube was looped in the hiatal hernia and secured to the nose. IMPRESSION: Unsuccessful attempts at advancing the feeding tube through the hiatal hernia despite multiple attempts with different guiding wires. Electronically Signed   By: Charlett Nose M.D.   On: 01/30/2020 16:57        Scheduled Meds: . chlorhexidine  15 mL Mouth Rinse BID  . Chlorhexidine Gluconate Cloth  6 each Topical Daily  . enoxaparin (LOVENOX) injection  60 mg Subcutaneous Q24H  . feeding supplement (PROSource TF)  45 mL Per Tube TID  . free water  200 mL Per Tube Q4H  . insulin aspart  0-9 Units Subcutaneous TID WC  . mouth rinse  15 mL Mouth Rinse BID  . methylPREDNISolone (SOLU-MEDROL) injection  20 mg Intravenous Q24H  . nystatin   Topical BID    Continuous Infusions: . dexmedetomidine (PRECEDEX) IV infusion 0.4 mcg/kg/hr (01/31/20 0537)  . dextrose 30 mL/hr at 01/31/20 0212  . feeding supplement (OSMOLITE 1.5 CAL)       LOS: 17 days   The patient is critically ill with multiple organ systems failure and requires high complexity decision making for assessment and support, frequent evaluation and titration of therapies, application of advanced monitoring technologies and extensive interpretation of multiple databases. Critical Care Time devoted to patient care services described in this note  Time spent: 40 minutes     Dacey Milberger, Roselind Messier, MD [facility] Pager [phone]  If 7PM-7AM, please contact night-coverage [email or web address] Password [facility] 01/31/2020, 7:58 AM

## 2020-01-31 NOTE — Evaluation (Addendum)
Clinical/Bedside Swallow Evaluation Patient Details  Name: Julie Mcguire MRN: [id number] Date of Birth: 09/10/1955  Today's Date: 01/31/2020 Time: SLP Start Time (ACUTE ONLY): 1345 SLP Stop Time (ACUTE ONLY): 1355 SLP Time Calculation (min) (ACUTE ONLY): 10 min  Past Medical History:  Past Medical History:  Diagnosis Date   Hypertension    Past Surgical History:  Past Surgical History:  Procedure Laterality Date   ABDOMINAL HYSTERECTOMY     APPENDECTOMY     CHOLECYSTECTOMY     TONSILLECTOMY     TOTAL HIP ARTHROPLASTY Left 09/22/2017   Procedure: LEFT TOTAL HIP ARTHROPLASTY ANTERIOR APPROACH;  Surgeon: Kathryne Hitch, MD;  Location: [facility];  Service: Orthopedics;  Laterality: Left;   HPI:  Per CCS notes" 64 y/o F, non-vaccinated,  who was recently admitted from 8/24-8/27 with COVID PNA who returned to the ER on 8/31 with reports of worsening shortness of breath and hypoxemia.  The patient was treated during the last admission with steroids, remdesivir and baricitinib.  At time of discharge she required 4L oxygen support.  On EMS arrival, she was found to have saturations of 75% on 6L and was escalated to NRB with improvement of saturations to 85%.  Initial CXR in the ER demonstrated bilateral infiltrates.  She was admitted to the hospital per [facility] for further care.  The patient was treated with IV steroids. D-dimer elevated, CT angio chest and lower extremity duplex negative. Strep antigen and urine legionella negative.  She had intermittent encephalopathy and was treated with IV ativan.  She remained on NRB.  PCCM consulted 9/14 for evaluation of encephalopathy and hypoxic respiratory failure."  Pt had feeding tube attempt to be placed in IR however they documented it was coiled in hernia.  SLP swallow evaluation.   Assessment / Plan / Recommendation Clinical Impression  Pt alert, cooperative but also did not follow directions to cease talking during session - even with  boluses in oral cavity.  CN exam unremarkable but pt is xerostomic.  SLP provided pt with oral moisture - lowering the face mask and informing pt of need to swallow.  She stated "I have" when she had not.   She then was provided with small amounts of strawberry icee.  Swallow was mildly delayed and multiple swallows noted which may be due to piecemealing or retention.  Pt also observed to inhalate deeply after swallow using abdominal muscles which may allow aspiration.  She is also in restraints, on 30L heated HFNC and NRB.  RR also increased with minimal amount of intake.  Due to pt's tenuous respiratory status and impaired cognition confusion/perseveration, decreased ability to follow directions etc would not recommend po diet at this time.  Recommend small amounts of icee, oral moisture given by RN on occasion when pt fully alert/ breathing well and able to attend to task to decrease disuse muscle atrophy and provide pt with comfort. Note feeding tube appears in stomach per KUB.  Anticipate as pt's respiratory status/cogntition improve, she will tolerate intake well.   SLP Visit Diagnosis: Dysphagia, unspecified (R13.10)    Aspiration Risk  Severe aspiration risk (primarily due to cognition and respiratory status)    Diet Recommendation Ice chips PRN after oral care;Other (Comment) (tiny ice chips, small amounts of icee, oral moisture)   Medication Administration: Via alternative means Postural Changes: Seated upright at 90 degrees    Other  Recommendations Oral Care Recommendations: Oral care QID   Follow up Recommendations    tbd  Frequency and Duration min 2x/week  2 weeks       Prognosis Prognosis for Safe Diet Advancement: Good Barriers to Reach Goals: Other (Comment) (current cognition and medical status)      Swallow Study   General HPI: Per CCS notes" 64 y/o F, non-vaccinated,  who was recently admitted from 8/24-8/27 with COVID PNA who returned to the ER on 8/31 with reports  of worsening shortness of breath and hypoxemia.  The patient was treated during the last admission with steroids, remdesivir and baricitinib.  At time of discharge she required 4L oxygen support.  On EMS arrival, she was found to have saturations of 75% on 6L and was escalated to NRB with improvement of saturations to 85%.  Initial CXR in the ER demonstrated bilateral infiltrates.  She was admitted to the hospital per [facility] for further care.  The patient was treated with IV steroids. D-dimer elevated, CT angio chest and lower extremity duplex negative. Strep antigen and urine legionella negative.  She had intermittent encephalopathy and was treated with IV ativan.  She remained on NRB.  PCCM consulted 9/14 for evaluation of encephalopathy and hypoxic respiratory failure."  Pt had feeding tube attempt to be placed in IR however they documented it was coiled in hernia.  SLP swallow evaluation. Type of Study: Bedside Swallow Evaluation Previous Swallow Assessment: none Diet Prior to this Study: Other (Comment) (Small bore feeding tube in nares) Temperature Spikes Noted: No History of Recent Intubation: No Behavior/Cognition: Alert;Cooperative;Pleasant mood Oral Cavity Assessment: Within Functional Limits Oral Care Completed by SLP: Yes Oral Cavity - Dentition: Adequate natural dentition Self-Feeding Abilities: Total assist Patient Positioning: Upright in bed Baseline Vocal Quality: Low vocal intensity Volitional Cough: Weak Volitional Swallow: Unable to elicit    Oral/Motor/Sensory Function Overall Oral Motor/Sensory Function: Within functional limits   Ice Chips Ice chips: Impaired (icee boluses) Pharyngeal Phase Impairments: Multiple swallows Other Comments: Pt with no indications of aspiration however she conducts deep inhalation post-swallow suspect due to breath hunger and RR increases to higher 20s.   Thin Liquid Thin Liquid:  (oral moisture via toothette) Other Comments: despite total cues, pt  did not elicit swallow although pt stated she had swallowed    Nectar Thick Nectar Thick Liquid: Not tested   Honey Thick Honey Thick Liquid: Not tested   Puree Puree: Not tested   Solid     Solid: Not tested      Chales Abrahams 01/31/2020,4:05 PM   Rolena Infante, MS [facility] SLP Acute Rehab Services Office [phone]

## 2020-01-31 NOTE — Progress Notes (Addendum)
Physical Therapy Re-Evaluation Patient Details Name: Julie Mcguire MRN: [id number] DOB: [birth year] Today's Date: 01/31/2020   History of Present Illness  Julie Mcguire is a 64 y.o. female with medical history significant of recent Covid pneumonia, hypertension, moderate obesity who presented to the emergency room with complaints of worsening shortness of breath.  Recently discharged from the hospital on 01/10/2020 following treatment for Covid-19 viral pneumonia.She was discharged on 4 L of oxygen and returned 8/31 with worsening short of breath.    Significant Hospital Events: 8/24-8/27 Admit with COVID 8/31 Admit with worsening shortness of breath 9/14 PCCM consulted for evaluation of encephalopathy and hypoxic respiratory failure 9/15 Precedex started 9/16 HHFNC + NRB intermittent , precedex    Clinical Impression  Patient resting comfortably in bed when therapy arrived and agreeable to participate in therapy. She now requires Max +2 assist for bed mobility with rolling and supine>sit transfer. Pt initially required Max assist to steady balance at EOB but progressed to min guard for safety with bil UE/LE support. Pt fatiguing and required cues to lift head up and open eyes. Attempted to complete sit<>stand and pt unable to fully rise and clear hip form EOB. PT became emotional and mildly agitated requesting to discharge home while trying to stand, HR elevated to max of 150 and pt returned to supine. SpO2 remained stable throughout session from 85-95% on H HFNC and NRB (45 L total). Acute PT reviewed need for further therapy at follow up setting, recommending SNF and discussed with pt/spouse. Acute PT will continue to follow and progress as able.     Follow Up Recommendations SNF    Equipment Recommendations  None recommended by PT (TBA)    Recommendations for Other Services       Precautions / Restrictions Precautions Precautions: Fall;Other (comment) Precaution Comments: Monitor  O2 VS Restrictions Weight Bearing Restrictions: No      Mobility  Bed Mobility Overal bed mobility: Needs Assistance Bed Mobility: Rolling;Sidelying to Sit;Sit to Supine Rolling: Max assist;+2 for physical assistance;+2 for safety/equipment Sidelying to sit: Total assist;+2 for physical assistance;+2 for safety/equipment;HOB elevated   Sit to supine: Total assist;+2 for physical assistance;+2 for safety/equipment   General bed mobility comments: Pt required Max assist to facilitate/initiate roll to Rt side with reaching Lt UE and Max assist for bringing LE's off EOB. Total Assist required to return to supine for controled trunk lowering and raiseing LE's into bed.  Transfers Overall transfer level: Needs assistance Equipment used: Rolling walker (2 wheeled);2 person hand held assist Transfers: Sit to/from Stand Sit to Stand: Max assist;Total assist;+2 safety/equipment;+2 physical assistance         General transfer comment: Attempted sit<>stand with bed slightly elevated. Pt required cues for safe hand placement/technique with RW for power up. Patient became anxious and aggitated wanting to be allowed to leave hospital. HR elevated and Max of 150 reached. Pt assisted back to bed.   Ambulation/Gait                Stairs            Wheelchair Mobility    Modified Rankin (Stroke Patients Only)       Balance Overall balance assessment: Needs assistance Sitting-balance support: Feet supported;Bilateral upper extremity supported Sitting balance-Leahy Scale: Poor       Standing balance-Leahy Scale: Zero Standing balance comment: unable to fully rise to stand today  Pertinent Vitals/Pain Pain Assessment: No/denies pain Faces Pain Scale: No hurt Pain Intervention(s): Monitored during session;Repositioned (pt anxoius at time but does not appear in pain)    Home Living   Prior Function      Hand Dominance        Extremity/Trunk  Assessment   Upper Extremity Assessment Upper Extremity Assessment: Defer to OT evaluation    Lower Extremity Assessment Lower Extremity Assessment: Generalized weakness;RLE deficits/detail;LLE deficits/detail RLE Deficits / Details: pt unable to flex hip/knee in supine, some activation to slide LE's laterally to EOB, pt able to perform LAQ sitting through part ROM. 3-/5 or less for LE throughout. LLE Deficits / Details: pt unable to flex hip/knee in supine, some activation to slide LE's laterally to EOB, pt able to perform LAQ sitting through part ROM. 3-/5 or less for LE throughout.    Cervical / Trunk Assessment Cervical / Trunk Assessment: Normal;Other exceptions Cervical / Trunk Exceptions: body habitus  Communication      Cognition Arousal/Alertness: Awake/alert Behavior During Therapy: Anxious              General Comments: pt oriented to self, place, not fully to situation. pt anxious at time and trying to bargain throughout session to postpone mobility and then to be able to discharge home. "If I stand up then you'll let me go....go home". Pt then becoming more anxious with discussion of rehab needs and HR elevated. Pt agitated and with assist to return to be repeated "help me" "[name] where are you". Pt's husband [name] at bedside and present throughout session.      General Comments General comments (skin integrity, edema, etc.): HR in 80s-90s at rest. Elevated to 120's with bed mobility and stable sitting EOB. Pt HR incresed as she became emotional about discahrging reaching max of 150bpm and returning to 120-130 in supine and 90's after 2-3 minutes rest. SpO2 stable throughout with low ~85% and increase to 94% with prob moved from finger to ear.     Exercises     Assessment/Plan    PT Assessment Patient needs continued PT services  PT Problem List Decreased activity tolerance;Decreased strength;Decreased mobility;Decreased knowledge of use of DME;Cardiopulmonary status  limiting activity       PT Treatment Interventions DME instruction;Gait training;Therapeutic exercise;Functional mobility training;Therapeutic activities;Patient/family education    PT Goals (Current goals can be found in the Care Plan section)  Acute Rehab PT Goals Patient Stated Goal: To improve mobility and strength PT Goal Formulation: With patient Time For Goal Achievement: 01/31/20 Potential to Achieve Goals: Good    Frequency Min 3X/week   Barriers to discharge        Co-evaluation PT/OT/SLP Co-Evaluation/Treatment: Yes Reason for Co-Treatment: For patient/therapist safety;To address functional/ADL transfers           AM-PAC PT "6 Clicks" Mobility  Outcome Measure Help needed turning from your back to your side while in a flat bed without using bedrails?: Total Help needed moving from lying on your back to sitting on the side of a flat bed without using bedrails?: Total Help needed moving to and from a bed to a chair (including a wheelchair)?: Total Help needed standing up from a chair using your arms (e.g., wheelchair or bedside chair)?: Total Help needed to walk in hospital room?: Total Help needed climbing 3-5 steps with a railing? : Total 6 Click Score: 6    End of Session Equipment Utilized During Treatment: Oxygen (H HFNC 30L/min, 15L/min NRB) Activity Tolerance: Patient tolerated  treatment well Patient left: in bed;with call bell/phone within reach;with bed alarm set;with family/visitor present Nurse Communication: Mobility status PT Visit Diagnosis: Other abnormalities of gait and mobility (R26.89)    Time: 1027-2536 PT Time Calculation (min) (ACUTE ONLY): 27 min   Charges:   PT Evaluation $PT Re-evaluation: 1 Re-eval          Wynn Maudlin, DPT Acute Rehabilitation Services  Office [phone] Pager [phone]  01/31/2020 1:54 PM

## 2020-01-31 NOTE — TOC Progression Note (Signed)
Transition of Care [facility]) - Progression Note    Patient Details  Name: FINLAY MILLS MRN: [id number] Date of Birth: August 21, 1955  [facility] of [facility]) CM/SW Contact  Golda Acre, RN Phone Number: 01/31/2020, 8:49 AM  Clinical Narrative:    Continues to require hfnrb at 70% Desat down to 84% with hfnc. Plan is to return to home versus snf.  Expected Discharge Plan: Home/Self Care Barriers to Discharge: Continued Medical Work up  Expected Discharge Plan and Services Expected Discharge Plan: Home/Self Care   Discharge Planning Services: CM Consult   Living arrangements for the past 2 months: Single Family Home                                       Social Determinants of Health (SDOH) Interventions    Readmission Risk Interventions No flowsheet data found.

## 2020-01-31 NOTE — Progress Notes (Signed)
NAME:  Julie Mcguire, MRN:  [id number], DOB:  18-Apr-1956, LOS: 17 ADMISSION DATE:  01/04/2020, CONSULTATION DATE:  01/28/20 REFERRING MD:  Dr. Sharon Seller, CHIEF COMPLAINT:     Brief History   64 y/o F admitted with acute hypoxemic respiratory failure in the setting of COVID PNA   History of present illness   64 y/o F, non-vaccinated,  who was recently admitted from 8/24-8/27 with COVID PNA who returned to the ER on 8/31 with reports of worsening shortness of breath and hypoxemia.   The patient was treated during the last admission with steroids, remdesivir and baricitinib.  At time of discharge she required 4L oxygen support.  On EMS arrival, she was found to have saturations of 75% on 6L and was escalated to NRB with improvement of saturations to 85%.  Initial CXR in the ER demonstrated bilateral infiltrates.  She was admitted to the hospital per [facility] for further care.  The patient was treated with IV steroids. D-dimer elevated, CT angio chest and lower extremity duplex negative. Strep antigen and urine legionella negative.  She had intermittent encephalopathy and was treated with IV ativan.  She remained on NRB.  PCCM consulted 9/14 for evaluation of encephalopathy and hypoxic respiratory failure  Past Medical History  HTN Obesity   Significant Hospital Events   8/24-8/27 Admit with COVID 8/31 Admit with worsening shortness of breath 9/14 PCCM consulted for evaluation of encephalopathy and hypoxic respiratory failure 9/15 Precedex started 9/16 HHFNC + NRB intermittent , precedex   Consults:    Procedures:     Significant Diagnostic Tests:   CTA Chest 9/2 >> negative for acute PE to the segmental level, findings consistent with known COVID infection and superimposed pulmonary edema   Venous Duplex BLE 9/2 >> negative bilaterally   Micro Data:  COVID 8/24 >> negative  Strep Antigen 9/6 >> negative  U. Legionella 9/6 >> negative   Antimicrobials:  Ceftriaxone 9/6 >> 9/8    Azithromycin 9/6 >> 9/8   Interim history/subjective:   Critically ill Appears agitated in spite of low-dose Precedex this morning, constantly screaming "help me" "I need more oxygen" On H HF [place] and nonrebreather   Objective   Blood pressure (!) 110/57, pulse (!) 121, temperature 100.2 F (37.9 C), temperature source Axillary, resp. rate (!) 25, height 5\' 4"  (1.626 m), weight 109.8 kg, SpO2 (!) 89 %.    FiO2 (%):  [70 %-80 %] 70 %   Intake/Output Summary (Last 24 hours) at 01/31/2020 0942 Last data filed at 01/31/2020 0535 Gross per 24 hour  Intake 1026.36 ml  Output 550 ml  Net 476.36 ml   Filed Weights   01/29/20 0500 01/30/20 0500 01/31/20 0500  Weight: 109.6 kg 109.1 kg 109.8 kg   Examination: General: adult female lying in bed, ill appearing, in distress, propped up position HEENT: MM pink/moist, NRB mask + HHFNC Neuro: Sedate, calm, responds to one-step commands CV: s1s2 rrr, no m/r/g PULM: Decreased breath sounds bilateral, mild accessory muscle use GI: soft, bsx4 active  Extremities: warm/dry, 1-2+ BLE pitting edema  Skin: no rashes or lesions  Chest x-ray 9/17 independently reviewed shows stable bilateral airspace disease Labs show normal electrolytes, slight decrease leukocytosis, mild thrombocytopenia  Resolved Hospital Problem list     Hypernatremia   Assessment & Plan:   ARDS / Acute Hypoxic Respiratory Failure in setting of COVID PNA Treated with baricitinib, remdesivir & steroids during first admit.  Now on steroids.  Initially treated with IV abx  for 3 days but PCT not convincing for acute bacterial component.  -Continue heated high flow, hypoxia seems to be worse with delirium, may use nonrebreather intermittently -wean O2 for sats >85% -Chest x-ray intermittent -Keep Solu-Medrol at 20 to avoid worsening delirium -Remains high risk for intubation   Acute Metabolic Encephalopathy  Suspect multifactorial in setting of hypoxia, steroids and  medications -Avoid benzodiazepines -continue precedex at lower rates / avoid oversedation  -RASS goal 0 to -1  -Allow family visitation  Transaminitis , resolving -per primary   Elevated D-Dimer  Venous duplex negative for DVT, CTA chest negative for PE -monitor   Steroid Induced Hyperglycemia  -SSI  At Risk Malnutrition  -cortrak placed, start tube feeds    Best practice:  Diet: Tfs Pain/Anxiety/Delirium protocol (if indicated): precedex  VAP protocol (if indicated): n/a DVT prophylaxis: per primary  GI prophylaxis: n/a  Glucose control: SSI  Mobility: As tolerated  Code Status: Full code  Family Communication: per primary  Disposition: SDU  Labs   CBC: Recent Labs  Lab 01/26/20 0258 01/27/20 0315 01/29/20 0240 01/30/20 0222 01/31/20 0239  WBC 19.4* 24.6* 26.1* 34.0* 29.2*  NEUTROABS  --   --   --  30.6* 24.1*  HGB 10.5* 11.7* 12.1 11.2* 11.0*  HCT 32.5* 35.6* 37.1 35.0* 35.0*  MCV 95.0 93.7 93.5 95.4 95.9  PLT 278 264 209 176 152    Basic Metabolic Panel: Recent Labs  Lab 01/25/20 0244 01/25/20 0244 01/26/20 0258 01/27/20 0315 01/29/20 0240 01/30/20 0222 01/31/20 0239  NA 152*   < > 144 141 138 136 139  K 4.2   < > 4.0 4.7 3.5 4.2 3.9  CL 103   < > 101 100 100 100 103  CO2 38*   < > 35* 32 30 25 23   GLUCOSE 182*   < > 165* 171* 117* 124* 118*  BUN 40*   < > 31* 24* 23 23 28*  CREATININE 0.73   < > 0.56 0.58 0.68 0.88 0.98  CALCIUM 9.0   < > 8.5* 8.6* 8.4* 8.4* 8.4*  MG 2.6*  --   --  2.3 2.1 2.2 2.2  PHOS  --   --   --   --   --  2.8 3.7   < > = values in this interval not displayed.   GFR: Estimated Creatinine Clearance: 73 mL/min (by C-G formula based on SCr of 0.98 mg/dL). Recent Labs  Lab 01/27/20 0315 01/29/20 0240 01/30/20 0222 01/31/20 0239  WBC 24.6* 26.1* 34.0* 29.2*    Liver Function Tests: Recent Labs  Lab 01/26/20 0258 01/27/20 0315 01/29/20 0240 01/30/20 0222 01/31/20 0239  AST 61* 53* 25 21 23   ALT 127* 149*  88* 67* 59*  ALKPHOS 50 59 59 61 65  BILITOT 0.7 0.8 0.7 1.2 0.7  PROT 5.1* 5.6* 5.7* 5.5* 5.8*  ALBUMIN 2.5* 2.7* 2.6* 2.4* 2.5*   No results for input(s): LIPASE, AMYLASE in the last 168 hours. No results for input(s): AMMONIA in the last 168 hours.  ABG    Component Value Date/Time   PHART 7.433 01/28/2020 1245   PCO2ART 48.7 (H) 01/28/2020 1245   PO2ART 64.9 (L) 01/28/2020 1245   HCO3 32.0 (H) 01/28/2020 1245   O2SAT 91.6 01/28/2020 1245     Coagulation Profile: No results for input(s): INR, PROTIME in the last 168 hours.  Cardiac Enzymes: No results for input(s): CKTOTAL, CKMB, CKMBINDEX, TROPONINI in the last 168 hours.  HbA1C: Hgb A1c  MFr Bld  Date/Time Value Ref Range Status  01/23/2020 02:33 AM 6.4 (H) 4.8 - 5.6 % Final    Comment:    (NOTE) Pre diabetes:          5.7%-6.4%  Diabetes:              >6.4%  Glycemic control for   <7.0% adults with diabetes     CBG: Recent Labs  Lab 01/30/20 1224 01/30/20 1734 01/30/20 1904 01/30/20 2303 01/31/20 0856  GLUCAP 218* 133* 120* 119* 160*       Cyril Mourning MD. FCCP.  Pulmonary & Critical care See Amion for pager  If no response to pager , please call [phone]  After 7:00 pm call Elink  [phone]   01/31/2020

## 2020-02-01 LAB — URINALYSIS, ROUTINE W REFLEX MICROSCOPIC
Bilirubin Urine: NEGATIVE
Glucose, UA: NEGATIVE mg/dL
Hgb urine dipstick: NEGATIVE
Ketones, ur: NEGATIVE mg/dL
Nitrite: NEGATIVE
Protein, ur: NEGATIVE mg/dL
Specific Gravity, Urine: 1.019 (ref 1.005–1.030)
pH: 5 (ref 5.0–8.0)

## 2020-02-01 LAB — CBC WITH DIFFERENTIAL/PLATELET
Abs Immature Granulocytes: 1.42 10*3/uL — ABNORMAL HIGH (ref 0.00–0.07)
Basophils Absolute: 0.1 10*3/uL (ref 0.0–0.1)
Basophils Relative: 0 %
Eosinophils Absolute: 0.1 10*3/uL (ref 0.0–0.5)
Eosinophils Relative: 0 %
HCT: 31.3 % — ABNORMAL LOW (ref 36.0–46.0)
Hemoglobin: 9.9 g/dL — ABNORMAL LOW (ref 12.0–15.0)
Immature Granulocytes: 7 %
Lymphocytes Relative: 5 %
Lymphs Abs: 1 10*3/uL (ref 0.7–4.0)
MCH: 30 pg (ref 26.0–34.0)
MCHC: 31.6 g/dL (ref 30.0–36.0)
MCV: 94.8 fL (ref 80.0–100.0)
Monocytes Absolute: 1.5 10*3/uL — ABNORMAL HIGH (ref 0.1–1.0)
Monocytes Relative: 7 %
Neutro Abs: 16.2 10*3/uL — ABNORMAL HIGH (ref 1.7–7.7)
Neutrophils Relative %: 81 %
Platelets: 117 10*3/uL — ABNORMAL LOW (ref 150–400)
RBC: 3.3 MIL/uL — ABNORMAL LOW (ref 3.87–5.11)
RDW: 14.4 % (ref 11.5–15.5)
WBC: 20.2 10*3/uL — ABNORMAL HIGH (ref 4.0–10.5)
nRBC: 0.2 % (ref 0.0–0.2)

## 2020-02-01 LAB — COMPREHENSIVE METABOLIC PANEL
ALT: 51 U/L — ABNORMAL HIGH (ref 0–44)
AST: 17 U/L (ref 15–41)
Albumin: 2.4 g/dL — ABNORMAL LOW (ref 3.5–5.0)
Alkaline Phosphatase: 62 U/L (ref 38–126)
Anion gap: 10 (ref 5–15)
BUN: 35 mg/dL — ABNORMAL HIGH (ref 8–23)
CO2: 28 mmol/L (ref 22–32)
Calcium: 8.5 mg/dL — ABNORMAL LOW (ref 8.9–10.3)
Chloride: 104 mmol/L (ref 98–111)
Creatinine, Ser: 0.99 mg/dL (ref 0.44–1.00)
GFR calc Af Amer: >60 mL/min (ref >60)
GFR calc non Af Amer: >60 mL/min (ref >60)
Glucose, Bld: 136 mg/dL — ABNORMAL HIGH (ref 70–99)
Potassium: 3.6 mmol/L (ref 3.5–5.1)
Sodium: 142 mmol/L (ref 135–145)
Total Bilirubin: 1.1 mg/dL (ref 0.3–1.2)
Total Protein: 5.4 g/dL — ABNORMAL LOW (ref 6.5–8.1)

## 2020-02-01 LAB — FERRITIN: Ferritin: 586 ng/mL — ABNORMAL HIGH (ref 11–307)

## 2020-02-01 LAB — GLUCOSE, CAPILLARY
Glucose-Capillary: 128 mg/dL — ABNORMAL HIGH (ref 70–99)
Glucose-Capillary: 213 mg/dL — ABNORMAL HIGH (ref 70–99)
Glucose-Capillary: 129 mg/dL — ABNORMAL HIGH (ref 70–99)
Glucose-Capillary: 112 mg/dL — ABNORMAL HIGH (ref 70–99)

## 2020-02-01 LAB — LACTATE DEHYDROGENASE: LDH: 212 U/L — ABNORMAL HIGH (ref 98–192)

## 2020-02-01 LAB — PHOSPHORUS: Phosphorus: 4.1 mg/dL (ref 2.5–4.6)

## 2020-02-01 LAB — MAGNESIUM: Magnesium: 2.3 mg/dL (ref 1.7–2.4)

## 2020-02-01 LAB — D-DIMER, QUANTITATIVE: D-Dimer, Quant: 0.94 ug/mL-FEU — ABNORMAL HIGH (ref 0.00–0.50)

## 2020-02-01 LAB — C-REACTIVE PROTEIN: CRP: 3.9 mg/dL — ABNORMAL HIGH (ref <1.0)

## 2020-02-01 MED ORDER — POTASSIUM CHLORIDE 20 MEQ/15ML (10%) PO SOLN
50.0000 meq | Freq: Every day | ORAL | Status: DC
  Administered 2020-02-01 – 2020-02-03 (×3): 50 meq
  Filled 2020-02-01 (×3): qty 45

## 2020-02-01 MED ORDER — GERHARDT'S BUTT CREAM
TOPICAL_CREAM | Freq: Two times a day (BID) | CUTANEOUS | Status: DC
  Administered 2020-02-01 – 2020-02-04 (×3): 1 via TOPICAL
  Filled 2020-02-01 (×2): qty 1

## 2020-02-01 MED ORDER — METOPROLOL TARTRATE 5 MG/5ML IV SOLN
5.0000 mg | INTRAVENOUS | Status: AC | PRN
  Administered 2020-02-01 (×2): 5 mg via INTRAVENOUS
  Filled 2020-02-01 (×2): qty 5

## 2020-02-01 NOTE — Progress Notes (Addendum)
PROGRESS NOTE    Julie Mcguire  IRJ:[id number] DOB: [birth year] DOA: 01/05/2020 PCP: Elias Else, MD   Brief Narrative:  [phone] PMHx HTN, and obesity  Recent COVID pneumonia,  who returned to the ED with worsening shortness of breath. She was discharged from the hospital 8/27 following treatment for Covid pneumonia with steroids, remdesivir, and baricitinib.  At the time of discharge she required 4 L oxygen support. When EMS arrived at her home she was found to be saturating 75% on 6 L of nasal cannula and required 15 L via nonrebreather mask to get saturations up to 85%. In the ED CXR noted moderate to severe bilateral patchy infiltrates and the patient was readmitted to the hospital with refractory Covid pneumonia.   Subjective: 9/18 elevated temp overnight but no fever A/O x1 (does not know where, when, why).  Does know she is in the hospital to every other question states I will answer you later.   Assessment & Plan:  Covid vaccination;  Principal Problem:   Acute respiratory failure with hypoxia (HCC) Active Problems:   Pneumonia due to COVID-19 virus   Essential hypertension   Obesity, Class III, BMI 40-49.9 (morbid obesity) (HCC)   COVID-19   SIRS (systemic inflammatory response syndrome) (HCC)   Goals of care, counseling/discussion   Palliative care by specialist   DNR (do not resuscitate) discussion   Secondary bacterial pneumonia   Hypokalemia   Toxic metabolic encephalopathy   Bacterial pneumonia   Transaminitis   Acute respiratory failure with hypoxia- COVID Pneumonia  Recent Labs    01/30/20 0222 01/31/20 0239 02/01/20 0304  DDIMER 0.81* 0.93* 0.94*  FERRITIN 830* 831* 586*  LDH 276* 277* 212*  CRP 10.9* 8.3* 3.9*    Lab Results  Component Value Date   SARSCOV2NAA POSITIVE (A) 01/07/2020   -Solu-Medrol 20 mg daily 8/24 > 8/27 + 8/31 > -Baricitinib 8/24 > 8/27 + 8/31 > 9/9 -Remdesivir 8/24 > 8/27 + 8/31 > 9/4 -9/17 PCXR pending  Possible  secondary bacterial pneumonia -Procalcitonin not convincing of bacterial pneumonia  -discontinued empiric antibiotics after 3 complete days of treatment  Toxic metabolic encephalopathy -CT head without acute findings -9/15 most likely secondary to sedating medication (Precedex) and acute delirium of prolonged critical illness. -9/15 attempting to wean patient off Precedex but patient becomes agitated and combative. -Still unable to place NG tube, therefore will not be able to use Seroquel, as medication for agitation in order to wean Precedex off. -9/15 obtain EKG and if no QT prolongation will start Haldol and attempt to wean Precedex. -9/16 patient's encephalopathy appears to be improving.  We will hold on starting Haldol, patient's family is supposed to be visiting today we will see if her cognition improves hopefully will be able to wean Precedex without adding any additional agents.. -9/17 Seroquel 25 mg QHS -9/18 wean patient off Precedex as tolerated.  Nutrition -Bedside placement appended to x3 have failed. -IR placement of NG tube cannot be accomplished secondary to hardware failure.  Hopefully by 9/16 there hardware will be repaired. -D5W 39ml/hr -9/17 IR to place NG tube; proximal stomach -9/17 trickle feeds only, to ensure no aspiration.  Aspiration precautions -Maintain head of bed> 30 degrees at all times  Transaminitis -Resolved except for ALT still mildly elevated   Hypokalemia -Potassium goal> 4 -9/18 potassium 50 mEq    DVT prophylaxis: Lovenox Code Status: Full Family Communication: 9/18 husband at bedside discussed plan of care answered all questions Status is: Inpatient  Dispo: The patient is from: Home              Anticipated d/c is to: Home              Anticipated d/c date is:??              Patient currently severely unstable      Consultants:  PCCM   Procedures/Significant Events:  8/24 initial admission to hospital - steroids,  remdesivir, and baricitinib 8/27 discharge home after treatment for Covid pneumonia 8/31 readmit for refractory Covid pneumonia   I have personally reviewed and interpreted all radiology studies and my findings are as above.  VENTILATOR SETTINGS: HHFNC +NRB 9/18 FiO2; 65% Flow;25 L/min SPO2 95%   Cultures 8/24 SARS coronavirus positive   Antimicrobials: Anti-infectives (From admission, onward)   Start     Ordered Stop   01/20/20 1130  cefTRIAXone (ROCEPHIN) 2 g in sodium chloride 0.9 % 100 mL IVPB  Status:  Discontinued        01/20/20 1042 01/22/20 1634   01/20/20 1130  azithromycin (ZITHROMAX) 500 mg in sodium chloride 0.9 % 250 mL IVPB  Status:  Discontinued        01/20/20 1042 01/22/20 1634   01/15/20 1000  remdesivir 100 mg in sodium chloride 0.9 % 100 mL IVPB       "Followed by" Linked Group Details   15-Jan-2020 1727 01/18/20 1922   01/15/2020 1830  remdesivir 200 mg in sodium chloride 0.9% 250 mL IVPB       "Followed by" Linked Group Details   01/15/20 1727 2020-01-15 1915       Devices    LINES / TUBES:      Continuous Infusions: . sodium chloride 10 mL/hr at 02/01/20 0649  . dexmedetomidine (PRECEDEX) IV infusion 0.5 mcg/kg/hr (02/01/20 0649)  . feeding supplement (OSMOLITE 1.5 CAL) 20 mL/hr at 01/31/20 1849     Objective: Vitals:   02/01/20 0300 02/01/20 0400 02/01/20 0500 02/01/20 0600  BP: (!) 111/52 (!) 136/59 (!) 141/68 131/64  Pulse: (!) 58 73 92   Resp: (!) 24 15 20 19   Temp:  98.4 F (36.9 C)    TempSrc:  Axillary    SpO2: 100% 100% 93% 95%  Weight:   108.5 kg   Height:        Intake/Output Summary (Last 24 hours) at 02/01/2020 02/03/2020 Last data filed at 02/01/2020 02/03/2020 Gross per 24 hour  Intake 1165.34 ml  Output 300 ml  Net 865.34 ml   Filed Weights   01/30/20 0500 01/31/20 0500 02/01/20 0500  Weight: 109.1 kg 109.8 kg 108.5 kg   Physical Exam:  General: A/O x1 (does not know where, when, why) answers every question with I will  tell you later, positive  acute respiratory distress Eyes: negative scleral hemorrhage, negative anisocoria, negative icterus ENT: Negative Runny nose, negative gingival bleeding, Neck:  Negative scars, masses, torticollis, lymphadenopathy, JVD Lungs: Clear to auscultation bilaterally without wheezes or crackles Cardiovascular: Regular rate and rhythm without murmur gallop or rub normal S1 and S2 Abdomen: MORBIDLY OBESE, negative abdominal pain, nondistended, positive soft, bowel sounds, no rebound, no ascites, no appreciable mass Extremities: No significant cyanosis, clubbing, or edema bilateral lower extremities Skin: Negative rashes, lesions, ulcers Psychiatric: Unable to assess secondary to encephalopathy Central nervous system: Does not follow commands, negative dysarthria, negative expressive aphasia, negative receptive aphasia.   .     Data Reviewed: Care during the described time interval was provided  by me .  I have reviewed this patient's available data, including medical history, events of note, physical examination, and all test results as part of my evaluation.   CBC: Recent Labs  Lab 01/27/20 0315 01/29/20 0240 01/30/20 0222 01/31/20 0239 02/01/20 0304  WBC 24.6* 26.1* 34.0* 29.2* 20.2*  NEUTROABS  --   --  30.6* 24.1* 16.2*  HGB 11.7* 12.1 11.2* 11.0* 9.9*  HCT 35.6* 37.1 35.0* 35.0* 31.3*  MCV 93.7 93.5 95.4 95.9 94.8  PLT 264 209 176 152 117*   Basic Metabolic Panel: Recent Labs  Lab 01/27/20 0315 01/29/20 0240 01/30/20 0222 01/31/20 0239 02/01/20 0304  NA 141 138 136 139 142  K 4.7 3.5 4.2 3.9 3.6  CL 100 100 100 103 104  CO2 32 30 25 23 28   GLUCOSE 171* 117* 124* 118* 136*  BUN 24* 23 23 28* 35*  CREATININE 0.58 0.68 0.88 0.98 0.99  CALCIUM 8.6* 8.4* 8.4* 8.4* 8.5*  MG 2.3 2.1 2.2 2.2 2.3  PHOS  --   --  2.8 3.7 4.1   GFR: Estimated Creatinine Clearance: 71.8 mL/min (by C-G formula based on SCr of 0.99 mg/dL). Liver Function Tests: Recent  Labs  Lab 01/27/20 0315 01/29/20 0240 01/30/20 0222 01/31/20 0239 02/01/20 0304  AST 53* 25 21 23 17   ALT 149* 88* 67* 59* 51*  ALKPHOS 59 59 61 65 62  BILITOT 0.8 0.7 1.2 0.7 1.1  PROT 5.6* 5.7* 5.5* 5.8* 5.4*  ALBUMIN 2.7* 2.6* 2.4* 2.5* 2.4*   No results for input(s): LIPASE, AMYLASE in the last 168 hours. No results for input(s): AMMONIA in the last 168 hours. Coagulation Profile: No results for input(s): INR, PROTIME in the last 168 hours. Cardiac Enzymes: No results for input(s): CKTOTAL, CKMB, CKMBINDEX, TROPONINI in the last 168 hours. BNP (last 3 results) No results for input(s): PROBNP in the last 8760 hours. HbA1C: No results for input(s): HGBA1C in the last 72 hours. CBG: Recent Labs  Lab 01/31/20 0856 01/31/20 1233 01/31/20 1633 01/31/20 2127 02/01/20 0727  GLUCAP 160* 164* 137* 147* 128*   Lipid Profile: No results for input(s): CHOL, HDL, LDLCALC, TRIG, CHOLHDL, LDLDIRECT in the last 72 hours. Thyroid Function Tests: No results for input(s): TSH, T4TOTAL, FREET4, T3FREE, THYROIDAB in the last 72 hours. Anemia Panel: Recent Labs    01/31/20 0239 02/01/20 0304  FERRITIN 831* 586*   Urine analysis:    Component Value Date/Time   COLORURINE YELLOW 01/07/2020 1819   APPEARANCEUR TURBID (A) 01/07/2020 1819   LABSPEC 1.018 01/07/2020 1819   PHURINE 5.0 01/07/2020 1819   GLUCOSEU NEGATIVE 01/07/2020 1819   HGBUR MODERATE (A) 01/07/2020 1819   BILIRUBINUR NEGATIVE 01/07/2020 1819   KETONESUR NEGATIVE 01/07/2020 1819   PROTEINUR >=300 (A) 01/07/2020 1819   NITRITE POSITIVE (A) 01/07/2020 1819   LEUKOCYTESUR TRACE (A) 01/07/2020 1819   Sepsis Labs: @LABRCNTIP (procalcitonin:4,lacticidven:4)  ) No results found for this or any previous visit (from the past 240 hour(s)).       Radiology Studies: DG Abd 1 View  Result Date: 01/31/2020 CLINICAL DATA:  Evaluate OG tube placement EXAM: ABDOMEN - 1 VIEW COMPARISON:  January 30, 2020 FINDINGS: The  feeding tube is looped back on itself in the upper abdomen at midline, likely in the proximal stomach. IMPRESSION: The feeding tube is looped back on itself in the upper abdomen at midline, likely in the proximal stomach. Electronically Signed   By: Gerome Samavid  Williams III M.D   On: 01/31/2020  15:17   DG Abd 1 View  Result Date: 01/30/2020 CLINICAL DATA:  Feeding tube placement EXAM: ABDOMEN - 1 VIEW COMPARISON:  01/29/2020 FINDINGS: Feeding tube is seen in the upper neck in the region of the oropharynx. Extensive bilateral airspace opacities throughout the lungs. Nonobstructive bowel gas pattern. IMPRESSION: Feeding tube seen in the oropharyngeal region. Extensive bilateral airspace disease, stable. Electronically Signed   By: Charlett Nose M.D.   On: 01/30/2020 12:41   DG CHEST PORT 1 VIEW  Result Date: 01/31/2020 CLINICAL DATA:  Shortness of breath.  COVID-19. EXAM: PORTABLE CHEST 1 VIEW COMPARISON:  January 29, 2020. FINDINGS: Stable cardiomediastinal silhouette. Feeding tube is seen entering stomach. No pneumothorax or pleural effusion is noted. Stable bilateral patchy airspace opacities are noted consistent with multifocal pneumonia. Bony thorax is unremarkable. IMPRESSION: Stable bilateral multifocal pneumonia. Electronically Signed   By: Lupita Raider M.D.   On: 01/31/2020 08:55   DG INTRO LONG GI TUBE  Result Date: 01/30/2020 CLINICAL DATA:  Feeding tube placement EXAM: FL FEEDING TUBE PLACEMENT FLUOROSCOPY TIME:  Fluoroscopy Time:  8 minutes 12 seconds Radiation Exposure Index (if provided by the fluoroscopic device): Number of Acquired Spot Images: 0 COMPARISON:  None. FINDINGS: Fluoroscopic guidance was utilized to advance the feeding tube from the oropharynx into the esophagus. Feeding tube coiled in the hiatal hernia. Multiple attempts were made to pass the feeding tube through the hiatal hernia unsuccessfully. This included multiple different guiding wires. Finally, after numerous  unsuccessful attempts, the feeding tube was looped in the hiatal hernia and secured to the nose. IMPRESSION: Unsuccessful attempts at advancing the feeding tube through the hiatal hernia despite multiple attempts with different guiding wires. Electronically Signed   By: Charlett Nose M.D.   On: 01/30/2020 16:57        Scheduled Meds: . chlorhexidine  15 mL Mouth Rinse BID  . Chlorhexidine Gluconate Cloth  6 each Topical Daily  . enoxaparin (LOVENOX) injection  60 mg Subcutaneous Q24H  . feeding supplement (PROSource TF)  45 mL Per Tube TID  . free water  200 mL Per Tube Q4H  . insulin aspart  0-9 Units Subcutaneous TID WC  . mouth rinse  15 mL Mouth Rinse BID  . methylPREDNISolone (SOLU-MEDROL) injection  20 mg Intravenous Q24H  . nystatin   Topical BID  . QUEtiapine  25 mg Per Tube QHS   Continuous Infusions: . sodium chloride 10 mL/hr at 02/01/20 0649  . dexmedetomidine (PRECEDEX) IV infusion 0.5 mcg/kg/hr (02/01/20 0649)  . feeding supplement (OSMOLITE 1.5 CAL) 20 mL/hr at 01/31/20 1849     LOS: 18 days   The patient is critically ill with multiple organ systems failure and requires high complexity decision making for assessment and support, frequent evaluation and titration of therapies, application of advanced monitoring technologies and extensive interpretation of multiple databases. Critical Care Time devoted to patient care services described in this note  Time spent: 40 minutes     Teddy Pena, Roselind Messier, MD [facility] Pager [phone]  If 7PM-7AM, please contact night-coverage [email or web address] Password [facility] 02/01/2020, 8:08 AM

## 2020-02-01 NOTE — Progress Notes (Signed)
NAME:  Julie Mcguire, MRN:  [id number], DOB:  August 18, 1955, LOS: 18 ADMISSION DATE:  12/30/2019, CONSULTATION DATE:  01/28/20 REFERRING MD:  Dr. Sharon Seller, CHIEF COMPLAINT:     Brief History   64 y/o F admitted with acute hypoxemic respiratory failure in the setting of COVID PNA   History of present illness   64 y/o F, non-vaccinated,  who was recently admitted from 8/24-8/27 with COVID PNA who returned to the ER on 8/31 with reports of worsening shortness of breath and hypoxemia.   The patient was treated during the last admission with steroids, remdesivir and baricitinib.  At time of discharge she required 4L oxygen support.  On EMS arrival, she was found to have saturations of 75% on 6L and was escalated to NRB with improvement of saturations to 85%.  Initial CXR in the ER demonstrated bilateral infiltrates.  She was admitted to the hospital per [facility] for further care.  The patient was treated with IV steroids. D-dimer elevated, CT angio chest and lower extremity duplex negative. Strep antigen and urine legionella negative.  She had intermittent encephalopathy and was treated with IV ativan.  She remained on NRB.  PCCM consulted 9/14 for evaluation of encephalopathy and hypoxic respiratory failure  Past Medical History  HTN Obesity   Significant Hospital Events   8/24-8/27 Admit with COVID 8/31 Admit with worsening shortness of breath 9/14 PCCM consulted for evaluation of encephalopathy and hypoxic respiratory failure 9/15 Precedex started 9/16 HHFNC + NRB intermittent , precedex   Consults:    Procedures:     Significant Diagnostic Tests:   CTA Chest 9/2 >> negative for acute PE to the segmental level, findings consistent with known COVID infection and superimposed pulmonary edema   Venous Duplex BLE 9/2 >> negative bilaterally   Micro Data:  COVID 8/24 >> negative  Strep Antigen 9/6 >> negative  U. Legionella 9/6 >> negative   Antimicrobials:  Ceftriaxone 9/6 >> 9/8    Azithromycin 9/6 >> 9/8   Interim history/subjective:   Continues on high flow nasal cannula, Precedex Much more awake, calm and interactive today.   Objective   Blood pressure (!) 119/59, pulse 66, temperature 99.2 F (37.3 C), temperature source Axillary, resp. rate 18, height 5\' 4"  (1.626 m), weight 108.5 kg, SpO2 100 %.    FiO2 (%):  [65 %-70 %] 65 %   Intake/Output Summary (Last 24 hours) at 02/01/2020 1017 Last data filed at 02/01/2020 0649 Gross per 24 hour  Intake 832.29 ml  Output 300 ml  Net 532.29 ml   Filed Weights   01/30/20 0500 01/31/20 0500 02/01/20 0500  Weight: 109.1 kg 109.8 kg 108.5 kg   Examination: Gen:      No acute distress HEENT:  EOMI, sclera anicteric Neck:     No masses; no thyromegaly Lungs:    Clear to auscultation bilaterally; normal respiratory effort CV:         Regular rate and rhythm; no murmurs Abd:      + bowel sounds; soft, non-tender; no palpable masses, no distension Ext:    No edema; adequate peripheral perfusion Skin:      Warm and dry; no rash Neuro: Awake, responsive  Labs/imaging reviewed Stable chemistry, WBC improving to 20.2. Hemoglobin down to 9.9 No new imaging  Resolved Hospital Problem list     Hypernatremia   Assessment & Plan:   ARDS / Acute Hypoxic Respiratory Failure in setting of COVID PNA Treated with baricitinib, remdesivir & steroids during  first admit.  Now on steroids.  Initially treated with IV abx for 3 days but PCT not convincing for acute bacterial component.  Continue heated high flow with intermittent use of nonrebreather Intermittent chest x-ray Continue Solu-Medrol at current dose Monitor for intubation  Acute Metabolic Encephalopathy  Suspect multifactorial in setting of hypoxia, steroids and medications Avoid benzos Continue Precedex. Wean as tolerated.  Transaminitis , resolving -per primary   Elevated D-Dimer  Venous duplex negative for DVT, CTA chest negative for  PE -monitor   Steroid Induced Hyperglycemia  -SSI  At Risk Malnutrition  Tube feeds   Best practice:  Diet: Tfs Pain/Anxiety/Delirium protocol (if indicated): precedex  VAP protocol (if indicated): n/a DVT prophylaxis: per primary  GI prophylaxis: n/a  Glucose control: SSI  Mobility: As tolerated  Code Status: Full code  Family Communication: per primary  Disposition: SDU  Critical care time:   The patient is critically ill with multiple organ system failure and requires high complexity decision making for assessment and support, frequent evaluation and titration of therapies, advanced monitoring, review of radiographic studies and interpretation of complex data.   Critical Care Time devoted to patient care services, exclusive of separately billable procedures, described in this note is 35 minutes.   Chilton Greathouse MD [facility] Please see [email or web address] for pager details.  02/01/2020, 10:27 AM

## 2020-02-01 NOTE — Progress Notes (Signed)
Physical Therapy Treatment Patient Details Name: Julie Mcguire MRN: [id number] DOB: Sep 26, 1955 Today's Date: 02/01/2020    History of Present Illness BRANDIE LOPES is a 64 y.o. female with medical history significant of recent Covid pneumonia, hypertension, moderate obesity who presented to the emergency room with complaints of worsening shortness of breath.  Recently discharged from the hospital on 01/10/2020 following treatment for Covid-19 viral pneumonia.She was discharged on 4 L of oxygen and returned 8/31 with worsening short of breath. Has required Precedex for agitation, restraints for safety and continues use of HFNC and NRB to maintain sats.    PT Comments    Bed exercises only on today. +2 assist unavailable on today. Pt participated fairly well with intermittent anxious behavior. Family was present during session.    Follow Up Recommendations  SNF     Equipment Recommendations  None recommended by PT    Recommendations for Other Services       Precautions / Restrictions Precautions Precautions: Fall Precaution Comments: feeding tube, Monitor O2 VS, can became anxious and fixated, +2 for safety to control behaviors    Mobility  Bed Mobility               General bed mobility comments: NT- +2 assist unavailable on today. Bed exercises only today.  Transfers                    Ambulation/Gait                 Stairs             Wheelchair Mobility    Modified Rankin (Stroke Patients Only)       Balance                                            Cognition Arousal/Alertness: Awake/alert Behavior During Therapy: Anxious Overall Cognitive Status: Impaired/Different from baseline Area of Impairment: Following commands;Problem solving;Safety/judgement                       Following Commands: Follows one step commands with increased time Safety/Judgement: Decreased awareness of safety   Problem  Solving: Requires verbal cues;Requires tactile cues        Exercises General Exercises - Upper Extremity Shoulder Flexion: AAROM;Both;10 reps Elbow Flexion: AROM;Both;10 reps General Exercises - Lower Extremity Ankle Circles/Pumps: AROM;Both;10 reps Heel Slides: AAROM;Both;10 reps Hip ABduction/ADduction: AAROM;Both;10 reps    General Comments        Pertinent Vitals/Pain Pain Assessment: Faces Faces Pain Scale: No hurt Pain Intervention(s): Monitored during session    Home Living                      Prior Function            PT Goals (current goals can now be found in the care plan section) Progress towards PT goals: Progressing toward goals    Frequency    Min 2X/week      PT Plan Frequency needs to be updated    Co-evaluation              AM-PAC PT "6 Clicks" Mobility   Outcome Measure  Help needed turning from your back to your side while in a flat bed without using bedrails?: Total Help needed moving from lying on your back to sitting  on the side of a flat bed without using bedrails?: Total Help needed moving to and from a bed to a chair (including a wheelchair)?: Total Help needed standing up from a chair using your arms (e.g., wheelchair or bedside chair)?: Total Help needed to walk in hospital room?: Total Help needed climbing 3-5 steps with a railing? : Total 6 Click Score: 6    End of Session Equipment Utilized During Treatment: Oxygen (HHFNC + NRB) Activity Tolerance: Patient tolerated treatment well Patient left: in bed;with restraints reapplied;with family/visitor present;with call bell/phone within reach   PT Visit Diagnosis: Muscle weakness (generalized) (M62.81);Other abnormalities of gait and mobility (R26.89)     Time: 1359-1416 PT Time Calculation (min) (ACUTE ONLY): 17 min  Charges:  $Therapeutic Exercise: 8-22 mins                         Faye Ramsay, PT Acute Rehabilitation  Office: [phone] Pager:  [phone]

## 2020-02-01 NOTE — Progress Notes (Signed)
Patient's was complaining the diarrhea and burning on her buttocks . Pt had 12 x small to medium BM throughout the day until evening since starting tube feeding on 9/17. Asked [facility]X.[facility]ount to stop the tube feeding at 9/18; 21:05.

## 2020-02-02 DIAGNOSIS — R000 Tachycardia, unspecified: Secondary | ICD-10-CM | POA: Diagnosis present

## 2020-02-02 LAB — GLUCOSE, CAPILLARY
Glucose-Capillary: 113 mg/dL — ABNORMAL HIGH (ref 70–99)
Glucose-Capillary: 200 mg/dL — ABNORMAL HIGH (ref 70–99)
Glucose-Capillary: 134 mg/dL — ABNORMAL HIGH (ref 70–99)
Glucose-Capillary: 114 mg/dL — ABNORMAL HIGH (ref 70–99)

## 2020-02-02 LAB — CBC WITH DIFFERENTIAL/PLATELET
Abs Immature Granulocytes: 1.46 10*3/uL — ABNORMAL HIGH (ref 0.00–0.07)
Basophils Absolute: 0.1 10*3/uL (ref 0.0–0.1)
Basophils Relative: 0 %
Eosinophils Absolute: 0 10*3/uL (ref 0.0–0.5)
Eosinophils Relative: 0 %
HCT: 32.6 % — ABNORMAL LOW (ref 36.0–46.0)
Hemoglobin: 10.5 g/dL — ABNORMAL LOW (ref 12.0–15.0)
Immature Granulocytes: 7 %
Lymphocytes Relative: 3 %
Lymphs Abs: 0.7 10*3/uL (ref 0.7–4.0)
MCH: 30.7 pg (ref 26.0–34.0)
MCHC: 32.2 g/dL (ref 30.0–36.0)
MCV: 95.3 fL (ref 80.0–100.0)
Monocytes Absolute: 1.8 10*3/uL — ABNORMAL HIGH (ref 0.1–1.0)
Monocytes Relative: 8 %
Neutro Abs: 18.4 10*3/uL — ABNORMAL HIGH (ref 1.7–7.7)
Neutrophils Relative %: 82 %
Platelets: 128 10*3/uL — ABNORMAL LOW (ref 150–400)
RBC: 3.42 MIL/uL — ABNORMAL LOW (ref 3.87–5.11)
RDW: 14.6 % (ref 11.5–15.5)
WBC: 22.4 10*3/uL — ABNORMAL HIGH (ref 4.0–10.5)
nRBC: 0.3 % — ABNORMAL HIGH (ref 0.0–0.2)

## 2020-02-02 LAB — D-DIMER, QUANTITATIVE: D-Dimer, Quant: 1.36 ug/mL-FEU — ABNORMAL HIGH (ref 0.00–0.50)

## 2020-02-02 LAB — COMPREHENSIVE METABOLIC PANEL
ALT: 61 U/L — ABNORMAL HIGH (ref 0–44)
AST: 31 U/L (ref 15–41)
Albumin: 2.5 g/dL — ABNORMAL LOW (ref 3.5–5.0)
Alkaline Phosphatase: 71 U/L (ref 38–126)
Anion gap: 10 (ref 5–15)
BUN: 37 mg/dL — ABNORMAL HIGH (ref 8–23)
CO2: 27 mmol/L (ref 22–32)
Calcium: 8.6 mg/dL — ABNORMAL LOW (ref 8.9–10.3)
Chloride: 107 mmol/L (ref 98–111)
Creatinine, Ser: 0.94 mg/dL (ref 0.44–1.00)
GFR calc Af Amer: >60 mL/min (ref >60)
GFR calc non Af Amer: >60 mL/min (ref >60)
Glucose, Bld: 102 mg/dL — ABNORMAL HIGH (ref 70–99)
Potassium: 3.7 mmol/L (ref 3.5–5.1)
Sodium: 144 mmol/L (ref 135–145)
Total Bilirubin: 1.1 mg/dL (ref 0.3–1.2)
Total Protein: 5.7 g/dL — ABNORMAL LOW (ref 6.5–8.1)

## 2020-02-02 LAB — C-REACTIVE PROTEIN: CRP: 3.6 mg/dL — ABNORMAL HIGH (ref <1.0)

## 2020-02-02 LAB — LACTATE DEHYDROGENASE: LDH: 260 U/L — ABNORMAL HIGH (ref 98–192)

## 2020-02-02 LAB — MAGNESIUM: Magnesium: 2 mg/dL (ref 1.7–2.4)

## 2020-02-02 LAB — LACTIC ACID, PLASMA
Lactic Acid, Venous: 1.9 mmol/L (ref 0.5–1.9)
Lactic Acid, Venous: 1.1 mmol/L (ref 0.5–1.9)

## 2020-02-02 LAB — FERRITIN: Ferritin: 485 ng/mL — ABNORMAL HIGH (ref 11–307)

## 2020-02-02 LAB — PHOSPHORUS: Phosphorus: 3.3 mg/dL (ref 2.5–4.6)

## 2020-02-02 MED ORDER — ALBUMIN HUMAN 25 % IV SOLN
25.0000 g | Freq: Once | INTRAVENOUS | Status: AC
  Administered 2020-02-02: 25 g via INTRAVENOUS
  Filled 2020-02-02: qty 100

## 2020-02-02 MED ORDER — DILTIAZEM HCL 25 MG/5ML IV SOLN
10.0000 mg | Freq: Once | INTRAVENOUS | Status: AC
  Administered 2020-02-02: 10 mg via INTRAVENOUS
  Filled 2020-02-02: qty 5

## 2020-02-02 MED ORDER — METOPROLOL TARTRATE 5 MG/5ML IV SOLN
5.0000 mg | Freq: Three times a day (TID) | INTRAVENOUS | Status: DC | PRN
  Administered 2020-02-02: 5 mg via INTRAVENOUS
  Filled 2020-02-02: qty 5

## 2020-02-02 MED ORDER — HYDRALAZINE HCL 20 MG/ML IJ SOLN
5.0000 mg | Freq: Four times a day (QID) | INTRAMUSCULAR | Status: DC | PRN
  Administered 2020-02-02 – 2020-02-04 (×3): 5 mg via INTRAVENOUS
  Filled 2020-02-02 (×3): qty 1

## 2020-02-02 MED ORDER — IPRATROPIUM-ALBUTEROL 0.5-2.5 (3) MG/3ML IN SOLN: 3.0000 mL | Freq: Four times a day (QID) | RESPIRATORY_TRACT | Status: DC

## 2020-02-02 MED ORDER — KETOROLAC TROMETHAMINE 30 MG/ML IJ SOLN
30.0000 mg | Freq: Once | INTRAMUSCULAR | Status: AC
  Administered 2020-02-02: 30 mg via INTRAVENOUS
  Filled 2020-02-02: qty 1

## 2020-02-02 MED ORDER — HYDROXYZINE HCL 50 MG/ML IM SOLN
50.0000 mg | Freq: Once | INTRAMUSCULAR | Status: AC
  Administered 2020-02-02: 50 mg via INTRAMUSCULAR
  Filled 2020-02-02: qty 1

## 2020-02-02 MED ORDER — SODIUM CHLORIDE 0.9 % IV BOLUS
1000.0000 mL | Freq: Once | INTRAVENOUS | Status: AC
  Administered 2020-02-02: 1000 mL via INTRAVENOUS

## 2020-02-02 MED ORDER — MORPHINE SULFATE (PF) 2 MG/ML IV SOLN
1.0000 mg | INTRAVENOUS | Status: DC | PRN
  Administered 2020-02-02 – 2020-02-07 (×11): 1 mg via INTRAVENOUS
  Filled 2020-02-02 (×12): qty 1

## 2020-02-02 MED ORDER — METOPROLOL TARTRATE 5 MG/5ML IV SOLN
5.0000 mg | Freq: Three times a day (TID) | INTRAVENOUS | Status: DC | PRN
  Administered 2020-02-02 – 2020-02-03 (×2): 5 mg via INTRAVENOUS
  Filled 2020-02-02 (×2): qty 5

## 2020-02-02 NOTE — Progress Notes (Signed)
My visit was with pt's husband as he was in the room when I arrived. He talked about how hard this [phone] has affected his wife. He contrasted how the virus has affected him and how it has hit her so much harder. He said he is doing the best he can do.  Please page if additional assistance is needed.  Her condition appears to be a deep concern for him.   Chaplain Elmarie Shiley, MDiv   02/02/20 1700  Clinical Encounter Type  Visited With Family

## 2020-02-02 NOTE — Progress Notes (Signed)
NAME:  Julie Mcguire, MRN:  [id number], DOB:  [birth year], LOS: 19 ADMISSION DATE:  01-23-20, CONSULTATION DATE:  01/28/20 REFERRING MD:  Dr. Sharon Seller, CHIEF COMPLAINT:     Brief History   64 y/o F admitted with acute hypoxemic respiratory failure in the setting of COVID PNA   History of present illness   64 y/o F, non-vaccinated,  who was recently admitted from 8/24-8/27 with COVID PNA who returned to the ER on 8/31 with reports of worsening shortness of breath and hypoxemia.   The patient was treated during the last admission with steroids, remdesivir and baricitinib.  At time of discharge she required 4L oxygen support.  On EMS arrival, she was found to have saturations of 75% on 6L and was escalated to NRB with improvement of saturations to 85%.  Initial CXR in the ER demonstrated bilateral infiltrates.  She was admitted to the hospital per [facility] for further care.  The patient was treated with IV steroids. D-dimer elevated, CT angio chest and lower extremity duplex negative. Strep antigen and urine legionella negative.  She had intermittent encephalopathy and was treated with IV ativan.  She remained on NRB.  PCCM consulted 9/14 for evaluation of encephalopathy and hypoxic respiratory failure  Past Medical History  HTN Obesity   Significant Hospital Events   8/24-8/27 Admit with COVID 8/31 Admit with worsening shortness of breath 9/14 PCCM consulted for evaluation of encephalopathy and hypoxic respiratory failure 9/15 Precedex started 9/16 HHFNC + NRB intermittent , precedex  9/19 remains on intermittent precedex  Consults:    Procedures:     Significant Diagnostic Tests:   CTA Chest 9/2 >> negative for acute PE to the segmental level, findings consistent with known COVID infection and superimposed pulmonary edema   Venous Duplex BLE 9/2 >> negative bilaterally   Micro Data:  COVID 8/24 >> negative  Strep Antigen 9/6 >> negative  U. Legionella 9/6 >> negative    Antimicrobials:  Ceftriaxone 9/6 >> 9/8  Azithromycin 9/6 >> 9/8   Interim history/subjective:   Precedex weaned out yesterday but restarted overnight due to tachycardia.  Objective   Blood pressure 130/65, pulse (!) 106, temperature 99.2 F (37.3 C), temperature source Oral, resp. rate 15, height 5\' 4"  (1.626 m), weight 108.7 kg, SpO2 99 %.    FiO2 (%):  [65 %-100 %] 100 %   Intake/Output Summary (Last 24 hours) at 02/02/2020 0845 Last data filed at 02/02/2020 0831 Gross per 24 hour  Intake 1530.52 ml  Output 1250 ml  Net 280.52 ml   Filed Weights   01/31/20 0500 02/01/20 0500 02/02/20 0500  Weight: 109.8 kg 108.5 kg 108.7 kg   Examination: Gen:      No acute distress HEENT:  EOMI, sclera anicteric Neck:     No masses; no thyromegaly Lungs:    Clear to auscultation bilaterally; normal respiratory effort CV:         Regular rate and rhythm; no murmurs Abd:      + bowel sounds; soft, non-tender; no palpable masses, no distension Ext:    No edema; adequate peripheral perfusion Skin:      Warm and dry; no rash Neuro: Awake, responsive  Labs/imaging reviewed Labs are stable.  WBC remains elevated 22.4 No new imaging  Resolved Hospital Problem list     Hypernatremia   Assessment & Plan:   ARDS / Acute Hypoxic Respiratory Failure in setting of COVID PNA Treated with baricitinib, remdesivir & steroids during first admit.  Now on steroids.  Initially treated with IV abx for 3 days but PCT not convincing for acute bacterial component.  Continue heated high flow with intermittent use of nonrebreather Intermittent chest x-ray Continue Solu-Medrol at current dose Monitor for intubation needs  Acute Metabolic Encephalopathy  Suspect multifactorial in setting of hypoxia, steroids and medications Avoid benzos Continue Precedex. Wean as tolerated.  Transaminitis , resolving -per primary   Elevated D-Dimer  Venous duplex negative for DVT, CTA chest negative for  PE -monitor   Steroid Induced Hyperglycemia  -SSI  At Risk Malnutrition  Tube feeds   Best practice:  Diet: Tfs Pain/Anxiety/Delirium protocol (if indicated): precedex  VAP protocol (if indicated): n/a DVT prophylaxis: per primary  GI prophylaxis: n/a  Glucose control: SSI  Mobility: As tolerated  Code Status: Full code  Family Communication: per primary  Disposition: SDU  Critical care time:   The patient is critically ill with multiple organ system failure and requires high complexity decision making for assessment and support, frequent evaluation and titration of therapies, advanced monitoring, review of radiographic studies and interpretation of complex data.   Critical Care Time devoted to patient care services, exclusive of separately billable procedures, described in this note is 35 minutes.   Chilton Greathouse MD [facility] Please see [email or web address] for pager details.  02/02/2020, 8:45 AM

## 2020-02-02 NOTE — Progress Notes (Signed)
eLink Physician-Brief Progress Note Patient Name: Julie Mcguire DOB: 10-07-1955 MRN: [id number]   Date of Service  02/02/2020  HPI/Events of Note  Sinus Tachycardia - HR = 145. Nursing feels that this is d/t anxiety.   eICU Interventions  Plan: 1. Low dose Precedex (0.2 - 0.6 mcg/kg/hour. Titrate to RASS = 0.      Intervention Category Major Interventions: Delirium, psychosis, severe agitation - evaluation and management;Arrhythmia - evaluation and management  [name] 02/02/2020, 2:48 AM

## 2020-02-02 NOTE — Progress Notes (Signed)
Patient's HR= 130-140's, sustaining. Informed [facility]/X.Blount and given orders for Metoprolol 5mg  x2, 1L of NS and EKG result was sinus tach with PAC. Interventions did not change the HR and patient getting anxious due to pain in her buttocks and unable to sleep. Called Elink/ Sommer MD order to restart Precedex at 0.2 with RASS=0.

## 2020-02-02 NOTE — Progress Notes (Signed)
PROGRESS NOTE    Julie Mcguire  GLO:[id number] DOB: December 18, 1955 DOA: 01/24/2020 PCP: Elias Else, MD   Brief Narrative:  [phone] PMHx HTN, and obesity  Recent COVID pneumonia,  who returned to the ED with worsening shortness of breath. She was discharged from the hospital 8/27 following treatment for Covid pneumonia with steroids, remdesivir, and baricitinib.  At the time of discharge she required 4 L oxygen support. When EMS arrived at her home she was found to be saturating 75% on 6 L of nasal cannula and required 15 L via nonrebreather mask to get saturations up to 85%. In the ED CXR noted moderate to severe bilateral patchy infiltrates and the patient was readmitted to the hospital with refractory Covid pneumonia.   Subjective: 9/19 T-max overnight 38.2 C, A/O x1 (does not know where, when, why) much more awake and interactive though still confused.  Husband at bedside I believe is the reason patient more interactive.  Follows all commands.    Assessment & Plan:  Covid vaccination;  Principal Problem:   Acute respiratory failure with hypoxia (HCC) Active Problems:   Pneumonia due to COVID-19 virus   Essential hypertension   Obesity, Class III, BMI 40-49.9 (morbid obesity) (HCC)   COVID-19   SIRS (systemic inflammatory response syndrome) (HCC)   Goals of care, counseling/discussion   Palliative care by specialist   DNR (do not resuscitate) discussion   Secondary bacterial pneumonia   Hypokalemia   Toxic metabolic encephalopathy   Bacterial pneumonia   Transaminitis   Sinus tachycardia   Acute respiratory failure with hypoxia- COVID Pneumonia  Recent Labs    01/31/20 0239 02/01/20 0304 02/02/20 0043  DDIMER 0.93* 0.94* 1.36*  FERRITIN 831* 586* 485*  LDH 277* 212* 260*  CRP 8.3* 3.9* 3.6*    Lab Results  Component Value Date   SARSCOV2NAA POSITIVE (A) 01/07/2020   -Solu-Medrol 20 mg daily 8/24 > 8/27 + 8/31 > -Baricitinib 8/24 > 8/27 + 8/31 >  9/9 -Remdesivir 8/24 > 8/27 + 8/31 > 9/4 -9/17 PCXR stable pneumonia see results below -9/19 DuoNeb QID  Possible secondary bacterial pneumonia -Procalcitonin not convincing of bacterial pneumonia  -discontinued empiric antibiotics after 3 complete days of treatment  Hyperthermia ---2dary to infection? --spikeed fever 9/19 T-max 38.2 C, --9/18 UA pending -- 9/19 obtain Blood culture  --obtain Lactic Acid   Toxic metabolic encephalopathy -CT head without acute findings -9/15 most likely secondary to sedating medication (Precedex) and acute delirium of prolonged critical illness. -9/15 attempting to wean patient off Precedex but patient becomes agitated and combative. -Still unable to place NG tube, therefore will not be able to use Seroquel, as medication for agitation in order to wean Precedex off. -9/15 obtain EKG and if no QT prolongation will start Haldol and attempt to wean Precedex. -9/16 patient's encephalopathy appears to be improving.  We will hold on starting Haldol, patient's family is supposed to be visiting today we will see if her cognition improves hopefully will be able to wean Precedex without adding any additional agents.. -9/17 Seroquel 25 mg QHS -9/18 wean patient off Precedex as tolerated.  Sinus Tachycardia --Metoprolol IV PRN  Nutrition -Bedside placement appended to x3 have failed. -IR placement of NG tube cannot be accomplished secondary to hardware failure.  Hopefully by 9/16 there hardware will be repaired. -D5W 42ml/hr -9/17 IR to place NG tube; proximal stomach -9/17 trickle feeds only, to ensure no aspiration.  Aspiration precautions -Maintain head of bed> 30 degrees at all  times  Transaminitis -Resolved except for ALT still mildly elevated Lab Results  Component Value Date   ALT 61 (H) 02/02/2020   ALT 51 (H) 02/01/2020   ALT 59 (H) 01/31/2020   ALT 67 (H) 01/30/2020   ALT 88 (H) 01/29/2020  -Slight increase, however remainder of liver  enzymes WNL  Hypokalemia -Potassium goal> 4     DVT prophylaxis: Lovenox Code Status: Full Family Communication: 9/19 husband at bedside discussed plan of care answered all questions Status is: Inpatient    Dispo: The patient is from: Home              Anticipated d/c is to: Home              Anticipated d/c date is:??              Patient currently severely unstable      Consultants:  PCCM   Procedures/Significant Events:  8/24 initial admission to hospital - steroids, remdesivir, and baricitinib 8/27 discharge home after treatment for Covid pneumonia 8/31 readmit for refractory Covid pneumonia 9/17 PCXR;Stable bilateral multifocal pneumonia.   I have personally reviewed and interpreted all radiology studies and my findings are as above.  VENTILATOR SETTINGS: HHFNC 9/19 FiO2; 100% Flow; 30 L/min SPO2 93%   Cultures 8/24 SARS coronavirus positive   Antimicrobials: Anti-infectives (From admission, onward)   Start     Ordered Stop   01/20/20 1130  cefTRIAXone (ROCEPHIN) 2 g in sodium chloride 0.9 % 100 mL IVPB  Status:  Discontinued        01/20/20 1042 01/22/20 1634   01/20/20 1130  azithromycin (ZITHROMAX) 500 mg in sodium chloride 0.9 % 250 mL IVPB  Status:  Discontinued        01/20/20 1042 01/22/20 1634   01/15/20 1000  remdesivir 100 mg in sodium chloride 0.9 % 100 mL IVPB       "Followed by" Linked Group Details   Jan 31, 2020 1727 01/18/20 1922   01-31-20 1830  remdesivir 200 mg in sodium chloride 0.9% 250 mL IVPB       "Followed by" Linked Group Details   2020/01/31 1727 01-31-20 1915       Devices    LINES / TUBES:      Continuous Infusions: . sodium chloride Stopped (02/01/20 1402)  . dexmedetomidine (PRECEDEX) IV infusion Stopped (02/02/20 0807)  . feeding supplement (OSMOLITE 1.5 CAL) 1,000 mL (02/02/20 0831)     Objective: Vitals:   02/02/20 1000 02/02/20 1100 02/02/20 1119 02/02/20 1533  BP: (!) 153/60     Pulse: (!) 118 (!)  121 (!) 122 (!) 109  Resp: 19 (!) 26 (!) 24 (!) 22  Temp:      TempSrc:      SpO2: 94% 96% 95% 93%  Weight:      Height:        Intake/Output Summary (Last 24 hours) at 02/02/2020 1539 Last data filed at 02/02/2020 0831 Gross per 24 hour  Intake 1530.52 ml  Output 1250 ml  Net 280.52 ml   Filed Weights   01/31/20 0500 02/01/20 0500 02/02/20 0500  Weight: 109.8 kg 108.5 kg 108.7 kg   Physical Exam:  General: A/O x1 (does not know where, when, why) much calmer believes secondary to husband's presence, positive acute respiratory distress Eyes: negative scleral hemorrhage, negative anisocoria, negative icterus ENT: Negative Runny nose, negative gingival bleeding, Neck:  Negative scars, masses, torticollis, lymphadenopathy, JVD Lungs: decreased breath sounds bilaterally without positive expiratory wheezes,  negativecrackles Cardiovascular: Regular rate and rhythm without murmur gallop or rub normal S1 and S2 Abdomen: MORBIDLY OBESE, negative abdominal pain, nondistended, positive soft, bowel sounds, no rebound, no ascites, no appreciable mass Extremities: No significant cyanosis, clubbing, or edema bilateral lower extremities Skin: Negative rashes, lesions, ulcers Psychiatric: Unable to assess secondary to encephalopathy Central nervous system:  Cranial nerves II through XII intact, tongue/uvula midline, all extremities muscle strength 5/5, sensation intact throughout, negative dysarthria, negative expressive aphasia, negative receptive aphasia.   .     Data Reviewed: Care during the described time interval was provided by me .  I have reviewed this patient's available data, including medical history, events of note, physical examination, and all test results as part of my evaluation.   CBC: Recent Labs  Lab 01/29/20 0240 01/30/20 0222 01/31/20 0239 02/01/20 0304 02/02/20 0043  WBC 26.1* 34.0* 29.2* 20.2* 22.4*  NEUTROABS  --  30.6* 24.1* 16.2* 18.4*  HGB 12.1 11.2* 11.0*  9.9* 10.5*  HCT 37.1 35.0* 35.0* 31.3* 32.6*  MCV 93.5 95.4 95.9 94.8 95.3  PLT 209 176 152 117* 128*   Basic Metabolic Panel: Recent Labs  Lab 01/29/20 0240 01/30/20 0222 01/31/20 0239 02/01/20 0304 02/02/20 0043  NA 138 136 139 142 144  K 3.5 4.2 3.9 3.6 3.7  CL 100 100 103 104 107  CO2 30 25 23 28 27   GLUCOSE 117* 124* 118* 136* 102*  BUN 23 23 28* 35* 37*  CREATININE 0.68 0.88 0.98 0.99 0.94  CALCIUM 8.4* 8.4* 8.4* 8.5* 8.6*  MG 2.1 2.2 2.2 2.3 2.0  PHOS  --  2.8 3.7 4.1 3.3   GFR: Estimated Creatinine Clearance: 75.7 mL/min (by C-G formula based on SCr of 0.94 mg/dL). Liver Function Tests: Recent Labs  Lab 01/29/20 0240 01/30/20 0222 01/31/20 0239 02/01/20 0304 02/02/20 0043  AST 25 21 23 17 31   ALT 88* 67* 59* 51* 61*  ALKPHOS 59 61 65 62 71  BILITOT 0.7 1.2 0.7 1.1 1.1  PROT 5.7* 5.5* 5.8* 5.4* 5.7*  ALBUMIN 2.6* 2.4* 2.5* 2.4* 2.5*   No results for input(s): LIPASE, AMYLASE in the last 168 hours. No results for input(s): AMMONIA in the last 168 hours. Coagulation Profile: No results for input(s): INR, PROTIME in the last 168 hours. Cardiac Enzymes: No results for input(s): CKTOTAL, CKMB, CKMBINDEX, TROPONINI in the last 168 hours. BNP (last 3 results) No results for input(s): PROBNP in the last 8760 hours. HbA1C: No results for input(s): HGBA1C in the last 72 hours. CBG: Recent Labs  Lab 02/01/20 1151 02/01/20 1648 02/01/20 2103 02/02/20 0742 02/02/20 1151  GLUCAP 213* 129* 112* 113* 200*   Lipid Profile: No results for input(s): CHOL, HDL, LDLCALC, TRIG, CHOLHDL, LDLDIRECT in the last 72 hours. Thyroid Function Tests: No results for input(s): TSH, T4TOTAL, FREET4, T3FREE, THYROIDAB in the last 72 hours. Anemia Panel: Recent Labs    02/01/20 0304 02/02/20 0043  FERRITIN 586* 485*   Urine analysis:    Component Value Date/Time   COLORURINE AMBER (A) 02/01/2020 1614   APPEARANCEUR HAZY (A) 02/01/2020 1614   LABSPEC 1.019 02/01/2020  1614   PHURINE 5.0 02/01/2020 1614   GLUCOSEU NEGATIVE 02/01/2020 1614   HGBUR NEGATIVE 02/01/2020 1614   BILIRUBINUR NEGATIVE 02/01/2020 1614   KETONESUR NEGATIVE 02/01/2020 1614   PROTEINUR NEGATIVE 02/01/2020 1614   NITRITE NEGATIVE 02/01/2020 1614   LEUKOCYTESUR SMALL (A) 02/01/2020 1614   Sepsis Labs: @LABRCNTIP (procalcitonin:4,lacticidven:4)  ) No results found for this or any  previous visit (from the past 240 hour(s)).       Radiology Studies: No results found.      Scheduled Meds: . chlorhexidine  15 mL Mouth Rinse BID  . Chlorhexidine Gluconate Cloth  6 each Topical Daily  . enoxaparin (LOVENOX) injection  60 mg Subcutaneous Q24H  . feeding supplement (PROSource TF)  45 mL Per Tube TID  . free water  200 mL Per Tube Q4H  . Gerhardt's butt cream   Topical BID  . insulin aspart  0-9 Units Subcutaneous TID WC  . ipratropium-albuterol  3 mL Nebulization QID  . mouth rinse  15 mL Mouth Rinse BID  . methylPREDNISolone (SOLU-MEDROL) injection  20 mg Intravenous Q24H  . nystatin   Topical BID  . potassium chloride  50 mEq Per Tube Daily  . QUEtiapine  25 mg Per Tube QHS   Continuous Infusions: . sodium chloride Stopped (02/01/20 1402)  . dexmedetomidine (PRECEDEX) IV infusion Stopped (02/02/20 0807)  . feeding supplement (OSMOLITE 1.5 CAL) 1,000 mL (02/02/20 0831)     LOS: 19 days   The patient is critically ill with multiple organ systems failure and requires high complexity decision making for assessment and support, frequent evaluation and titration of therapies, application of advanced monitoring technologies and extensive interpretation of multiple databases. Critical Care Time devoted to patient care services described in this note  Time spent: 40 minutes     Ivory Maduro, Roselind MessierURTIS J, MD [facility] Pager [phone]  If 7PM-7AM, please contact night-coverage [email or web address] Password [email or web address] 02/02/2020, 3:39 PM

## 2020-02-03 DIAGNOSIS — N390 Urinary tract infection, site not specified: Secondary | ICD-10-CM | POA: Diagnosis present

## 2020-02-03 LAB — GLUCOSE, CAPILLARY
Glucose-Capillary: 144 mg/dL — ABNORMAL HIGH (ref 70–99)
Glucose-Capillary: 229 mg/dL — ABNORMAL HIGH (ref 70–99)
Glucose-Capillary: 174 mg/dL — ABNORMAL HIGH (ref 70–99)
Glucose-Capillary: 152 mg/dL — ABNORMAL HIGH (ref 70–99)

## 2020-02-03 LAB — CBC WITH DIFFERENTIAL/PLATELET
Abs Immature Granulocytes: 0.94 10*3/uL — ABNORMAL HIGH (ref 0.00–0.07)
Basophils Absolute: 0.1 10*3/uL (ref 0.0–0.1)
Basophils Relative: 0 %
Eosinophils Absolute: 0.1 10*3/uL (ref 0.0–0.5)
Eosinophils Relative: 0 %
HCT: 30.1 % — ABNORMAL LOW (ref 36.0–46.0)
Hemoglobin: 9.2 g/dL — ABNORMAL LOW (ref 12.0–15.0)
Immature Granulocytes: 6 %
Lymphocytes Relative: 3 %
Lymphs Abs: 0.6 10*3/uL — ABNORMAL LOW (ref 0.7–4.0)
MCH: 30.1 pg (ref 26.0–34.0)
MCHC: 30.6 g/dL (ref 30.0–36.0)
MCV: 98.4 fL (ref 80.0–100.0)
Monocytes Absolute: 1.3 10*3/uL — ABNORMAL HIGH (ref 0.1–1.0)
Monocytes Relative: 8 %
Neutro Abs: 13.7 10*3/uL — ABNORMAL HIGH (ref 1.7–7.7)
Neutrophils Relative %: 83 %
Platelets: 113 10*3/uL — ABNORMAL LOW (ref 150–400)
RBC: 3.06 MIL/uL — ABNORMAL LOW (ref 3.87–5.11)
RDW: 15.3 % (ref 11.5–15.5)
WBC: 16.6 10*3/uL — ABNORMAL HIGH (ref 4.0–10.5)
nRBC: 0.4 % — ABNORMAL HIGH (ref 0.0–0.2)

## 2020-02-03 LAB — COMPREHENSIVE METABOLIC PANEL
ALT: 57 U/L — ABNORMAL HIGH (ref 0–44)
AST: 37 U/L (ref 15–41)
Albumin: 2.7 g/dL — ABNORMAL LOW (ref 3.5–5.0)
Alkaline Phosphatase: 61 U/L (ref 38–126)
Anion gap: 9 (ref 5–15)
BUN: 34 mg/dL — ABNORMAL HIGH (ref 8–23)
CO2: 27 mmol/L (ref 22–32)
Calcium: 8.9 mg/dL (ref 8.9–10.3)
Chloride: 111 mmol/L (ref 98–111)
Creatinine, Ser: 0.81 mg/dL (ref 0.44–1.00)
GFR calc Af Amer: >60 mL/min (ref >60)
GFR calc non Af Amer: >60 mL/min (ref >60)
Glucose, Bld: 137 mg/dL — ABNORMAL HIGH (ref 70–99)
Potassium: 4.1 mmol/L (ref 3.5–5.1)
Sodium: 147 mmol/L — ABNORMAL HIGH (ref 135–145)
Total Bilirubin: 0.6 mg/dL (ref 0.3–1.2)
Total Protein: 5.6 g/dL — ABNORMAL LOW (ref 6.5–8.1)

## 2020-02-03 LAB — MAGNESIUM: Magnesium: 2 mg/dL (ref 1.7–2.4)

## 2020-02-03 LAB — D-DIMER, QUANTITATIVE: D-Dimer, Quant: 0.87 ug/mL-FEU — ABNORMAL HIGH (ref 0.00–0.50)

## 2020-02-03 LAB — C-REACTIVE PROTEIN: CRP: 4.1 mg/dL — ABNORMAL HIGH (ref <1.0)

## 2020-02-03 LAB — LACTATE DEHYDROGENASE: LDH: 242 U/L — ABNORMAL HIGH (ref 98–192)

## 2020-02-03 LAB — PHOSPHORUS: Phosphorus: 3.5 mg/dL (ref 2.5–4.6)

## 2020-02-03 LAB — FERRITIN: Ferritin: 397 ng/mL — ABNORMAL HIGH (ref 11–307)

## 2020-02-03 MED ORDER — METOPROLOL TARTRATE 5 MG/5ML IV SOLN
5.0000 mg | Freq: Three times a day (TID) | INTRAVENOUS | Status: DC
  Administered 2020-02-03: 5 mg via INTRAVENOUS
  Filled 2020-02-03: qty 5

## 2020-02-03 MED ORDER — FUROSEMIDE 10 MG/ML IJ SOLN
60.0000 mg | Freq: Once | INTRAMUSCULAR | Status: AC
  Administered 2020-02-03: 60 mg via INTRAVENOUS
  Filled 2020-02-03: qty 6

## 2020-02-03 MED ORDER — METOPROLOL TARTRATE 25 MG/10 ML ORAL SUSPENSION
12.5000 mg | Freq: Three times a day (TID) | ORAL | Status: DC
  Administered 2020-02-03 (×2): 12.5 mg
  Filled 2020-02-03 (×4): qty 5

## 2020-02-03 MED ORDER — PHENAZOPYRIDINE HCL 200 MG PO TABS
200.0000 mg | ORAL_TABLET | Freq: Once | ORAL | Status: AC
  Administered 2020-02-03: 200 mg
  Filled 2020-02-03 (×2): qty 1

## 2020-02-03 MED ORDER — VANCOMYCIN HCL 1250 MG/250ML IV SOLN
1250.0000 mg | INTRAVENOUS | Status: AC
  Administered 2020-02-03 – 2020-02-05 (×3): 1250 mg via INTRAVENOUS
  Filled 2020-02-03 (×3): qty 250

## 2020-02-03 MED ORDER — METHYLPREDNISOLONE SODIUM SUCC 40 MG IJ SOLR
20.0000 mg | INTRAMUSCULAR | Status: DC
  Administered 2020-02-04 – 2020-02-08 (×5): 20 mg via INTRAVENOUS
  Filled 2020-02-03 (×5): qty 1

## 2020-02-03 MED ORDER — OSMOLITE 1.5 CAL PO LIQD
1000.0000 mL | ORAL | Status: DC
  Filled 2020-02-03 (×3): qty 1000

## 2020-02-03 MED ORDER — PHENAZOPYRIDINE HCL 200 MG PO TABS
200.0000 mg | ORAL_TABLET | Freq: Once | ORAL | Status: DC
  Filled 2020-02-03: qty 1

## 2020-02-03 MED ORDER — FREE WATER
200.0000 mL | Status: DC
  Administered 2020-02-03 – 2020-02-04 (×4): 200 mL

## 2020-02-03 NOTE — Progress Notes (Signed)
Pt appears to be increasingly agitated when attempting to void. Pt becomes tachypneic, tachycardic, and moans continuously. Pt was found to be UTI + on 9/18. D/t extreme discomfort, pyridium was added by CCM and vanc was started by pharmacy. This RN will continue to carefully monitor pt.

## 2020-02-03 NOTE — Progress Notes (Signed)
Pharmacy Antibiotic Note  Julie Mcguire is a 64 y.o. female recently discharged 8/27 with COVID PNAadmitted on January 17, 2020 with refractory COVID PNA. Patient has been agitated which seems to be worse when she needs to urinate.  Pharmacy has been consulted for vancomycin dosing for Staph epidermidis UTI. PO intake is unreliable.   Plan: Vancomycin 1250 mg iv q 24 hours x 3 days - predicted AUC 406 - f/u renal function, sensitivities  Height: 5\' 4"  (162.6 cm) Weight: 108.7 kg (239 lb 10.2 oz) IBW/kg (Calculated) : 54.7  Temp (24hrs), Avg:99.5 F (37.5 C), Min:98.6 F (37 C), Max:101 F (38.3 C)  Recent Labs  Lab 01/30/20 0222 01/31/20 0239 02/01/20 0304 02/02/20 0043 02/02/20 1151 02/02/20 1432 02/03/20 0250  WBC 34.0* 29.2* 20.2* 22.4*  --   --  16.6*  CREATININE 0.88 0.98 0.99 0.94  --   --  0.81  LATICACIDVEN  --   --   --   --  1.9 1.1  --     Estimated Creatinine Clearance: 87.9 mL/min (by C-G formula based on SCr of 0.81 mg/dL).    Allergies  Allergen Reactions  . Codeine Nausea And Vomiting    Thank you for allowing pharmacy to be a part of this patient's care.  02/05/20 D 02/03/2020 2:02 PM

## 2020-02-03 NOTE — Progress Notes (Signed)
NAME:  Julie Mcguire, MRN:  [id number], DOB:  [birth year], LOS: 20 ADMISSION DATE:  12/26/2019, CONSULTATION DATE:  01/28/20 REFERRING MD:  Dr. Sharon Seller, CHIEF COMPLAINT:     Brief History   64 y/o F admitted with acute hypoxemic respiratory failure in the setting of COVID PNA   History of present illness   64 y/o F, non-vaccinated,  who was recently admitted from 8/24-8/27 with COVID PNA who returned to the ER on 8/31 with reports of worsening shortness of breath and hypoxemia.   The patient was treated during the last admission with steroids, remdesivir and baricitinib.  At time of discharge she required 4L oxygen support.  On EMS arrival, she was found to have saturations of 75% on 6L and was escalated to NRB with improvement of saturations to 85%.  Initial CXR in the ER demonstrated bilateral infiltrates.  She was admitted to the hospital per [facility] for further care.  The patient was treated with IV steroids. D-dimer elevated, CT angio chest and lower extremity duplex negative. Strep antigen and urine legionella negative.  She had intermittent encephalopathy and was treated with IV ativan.  She remained on NRB.  PCCM consulted 9/14 for evaluation of encephalopathy and hypoxic respiratory failure  Past Medical History  HTN Obesity   Significant Hospital Events   8/24-8/27 Admit with COVID 8/31 Admit with worsening shortness of breath 9/14 PCCM consulted for evaluation of encephalopathy and hypoxic respiratory failure 9/15 Precedex started 9/16 HHFNC + NRB intermittent , precedex  9/19 remains on intermittent precedex  Consults:    Procedures:     Significant Diagnostic Tests:   CTA Chest 9/2 >> negative for acute PE to the segmental level, findings consistent with known COVID infection and superimposed pulmonary edema   Venous Duplex BLE 9/2 >> negative bilaterally   Micro Data:  COVID 8/24 >> negative  Strep Antigen 9/6 >> negative  U. Legionella 9/6 >> negative    Antimicrobials:  Ceftriaxone 9/6 >> 9/8  Azithromycin 9/6 >> 9/8   Interim history/subjective:   Precedex weaned out yesterday but restarted overnight due to tachycardia.  Objective   Blood pressure (!) 163/52, pulse (!) 115, temperature 99.8 F (37.7 C), temperature source Axillary, resp. rate 13, height 5\' 4"  (1.626 m), weight 108.7 kg, SpO2 100 %.    FiO2 (%):  [80 %-100 %] 80 %   Intake/Output Summary (Last 24 hours) at 02/03/2020 0847 Last data filed at 02/03/2020 0400 Gross per 24 hour  Intake 149.67 ml  Output 900 ml  Net -750.33 ml   Filed Weights   01/31/20 0500 02/01/20 0500 02/02/20 0500  Weight: 109.8 kg 108.5 kg 108.7 kg   Examination: Gen:      No acute distress HEENT:  EOMI, sclera anicteric Neck:     No masses; no thyromegaly Lungs:    Clear to auscultation bilaterally; normal respiratory effort CV:         Regular rate and rhythm; no murmurs Abd:      + bowel sounds; soft, non-tender; no palpable masses, no distension Ext:    No edema; adequate peripheral perfusion Skin:      Warm and dry; no rash Neuro: Somnolent  Labs/imaging reviewed Sodium slightly elevated at 147, ALT 57 WBC improving to 16.6 No new imaging  Resolved Hospital Problem list     Hypernatremia   Assessment & Plan:   ARDS / Acute Hypoxic Respiratory Failure in setting of COVID PNA Treated with baricitinib, remdesivir & steroids during  first admit.  Now on steroids.  Initially treated with IV abx for 3 days but PCT not convincing for acute bacterial component.  Continue heated high flow with intermittent use of nonrebreather.  Wean down oxygen as tolerated Intermittent chest x-ray Can can stop the Solu-Medrol as she has received adequate therapy   Acute Metabolic Encephalopathy  Suspect multifactorial in setting of hypoxia, steroids and medications Avoid benzos Off Precedex Continue Seroquel at night  Transaminitis , resolving -per primary   Elevated D-Dimer  Venous  duplex negative for DVT, CTA chest negative for PE -monitor   Steroid Induced Hyperglycemia  -SSI  At Risk Malnutrition  Tube feeds   Best practice:  Diet: Tfs Pain/Anxiety/Delirium protocol (if indicated): precedex  VAP protocol (if indicated): n/a DVT prophylaxis: per primary  GI prophylaxis: n/a  Glucose control: SSI  Mobility: As tolerated  Code Status: Full code  Family Communication: per primary  Disposition: SDU  Critical care time: NA   Roselee Tayloe MD [facility] Please see [email or web address] for pager details.  02/03/2020, 8:47 AM

## 2020-02-03 NOTE — Progress Notes (Signed)
PROGRESS NOTE    Julie MarionDeborah M Mcguire  AOZ:[id number] DOB: Dec 20, 1955 DOA: 03-13-2020 PCP: Elias Elseeade, Robert, MD   Brief Narrative:  [phone] WF PMHx HTN, and obesity  Recent COVID pneumonia,  who returned to the ED with worsening shortness of breath. She was discharged from the hospital 8/27 following treatment for Covid pneumonia with steroids, remdesivir, and baricitinib.  At the time of discharge she required 4 L oxygen support. When EMS arrived at her home she was found to be saturating 75% on 6 L of nasal cannula and required 15 L via nonrebreather mask to get saturations up to 85%. In the ED CXR noted moderate to severe bilateral patchy infiltrates and the patient was readmitted to the hospital with refractory Covid pneumonia.   Subjective: 9/20 T-max overnight 38.3 C,   T-max overnight 38.2 C, A/O x1 (does not know where, when, why) much more awake and interactive though still confused.  Husband at bedside I believe is the reason patient more interactive.  Follows all commands.    Assessment & Plan:  Covid vaccination;  Principal Problem:   Acute respiratory failure with hypoxia (HCC) Active Problems:   Pneumonia due to COVID-19 virus   Essential hypertension   Obesity, Class III, BMI 40-49.9 (morbid obesity) (HCC)   COVID-19   SIRS (systemic inflammatory response syndrome) (HCC)   Goals of care, counseling/discussion   Palliative care by specialist   DNR (do not resuscitate) discussion   Secondary bacterial pneumonia   Hypokalemia   Toxic metabolic encephalopathy   Bacterial pneumonia   Transaminitis   Sinus tachycardia   Acute respiratory failure with hypoxia- COVID Pneumonia  Recent Labs    02/01/20 0304 02/02/20 0043 02/03/20 0250  DDIMER 0.94* 1.36* 0.87*  FERRITIN 586* 485* 397*  LDH 212* 260* 242*  CRP 3.9* 3.6* 4.1*    Lab Results  Component Value Date   SARSCOV2NAA POSITIVE (A) 01/07/2020   -Solu-Medrol 20 mg daily 8/24 > 8/27 + 8/31 > -Baricitinib  8/24 > 8/27 + 8/31 > 9/9 -Remdesivir 8/24 > 8/27 + 8/31 > 9/4 -9/17 PCXR stable pneumonia see results below -9/19 DuoNeb QID  Possible secondary bacterial pneumonia -Procalcitonin not convincing of bacterial pneumonia  -discontinued empiric antibiotics after 3 complete days of treatment  Hyperthermia ---2dary to infection? --spikeed fever 9/19 T-max 38.2 C, --9/18 UA pending -- 9/19 obtain Blood culture  --obtain Lactic Acid   UTI positive staph epidermidis -low dose vanco for 3 days and 1x pyridium 200  Toxic metabolic encephalopathy -CT head without acute findings -9/15 most likely secondary to sedating medication (Precedex) and acute delirium of prolonged critical illness. -9/15 attempting to wean patient off Precedex but patient becomes agitated and combative. -Still unable to place NG tube, therefore will not be able to use Seroquel, as medication for agitation in order to wean Precedex off. -9/15 obtain EKG and if no QT prolongation will start Haldol and attempt to wean Precedex. -9/16 patient's encephalopathy appears to be improving.  We will hold on starting Haldol, patient's family is supposed to be visiting today we will see if her cognition improves hopefully will be able to wean Precedex without adding any additional agents.. -9/17 Seroquel 25 mg QHS -9/18 wean patient off Precedex as tolerated.  Sinus Tachycardia/HTN --9/20 Metoprolol 12.5 mg per tube q 8 hr -Hydralazine PRN -9/20 Lasix IV 60 mg -Strict in and out +4.6 L -Daily weight Filed Weights   01/31/20 0500 02/01/20 0500 02/02/20 0500  Weight: 109.8 kg 108.5 kg 108.7  kg     Nutrition -Bedside placement appended to x3 have failed. -IR placement of NG tube cannot be accomplished secondary to hardware failure.  Hopefully by 9/16 there hardware will be repaired. -D5W 41ml/hr -9/17 IR to place NG tube; proximal stomach -9/20 increase feeds to goal, as patient has tolerated trickle feeds without  incident.   -Continue aspiration precautions -Maintain head of bed> 30 degrees at all times  Transaminitis -Resolved except for ALT still mildly elevated Lab Results  Component Value Date   ALT 57 (H) 02/03/2020   ALT 61 (H) 02/02/2020   ALT 51 (H) 02/01/2020   ALT 59 (H) 01/31/2020   ALT 67 (H) 01/30/2020  -Slight increase, however remainder of liver enzymes WNL  Hypokalemia -Potassium goal> 4     DVT prophylaxis: Lovenox Code Status: Full Family Communication: 9/20 husband at bedside discussed plan of care answered all questions Status is: Inpatient    Dispo: The patient is from: Home              Anticipated d/c is to: Home              Anticipated d/c date is: ??              Patient currently severely unstable      Consultants:  PCCM   Procedures/Significant Events:  8/24 initial admission to hospital - steroids, remdesivir, and baricitinib 8/27 discharge home after treatment for Covid pneumonia 8/31 readmit for refractory Covid pneumonia 9/17 PCXR;Stable bilateral multifocal pneumonia. 9/18 UTI   I have personally reviewed and interpreted all radiology studies and my findings are as above.  VENTILATOR SETTINGS: HHFNC +NRB 9/20 FiO2; 80% Flow; 30 L/min SPO2 98%   Cultures 8/24 SARS coronavirus positive 9/18 urine positive staph epidermidis 9/19 blood pending    Antimicrobials: Anti-infectives (From admission, onward)   Start     Ordered Stop   01/20/20 1130  cefTRIAXone (ROCEPHIN) 2 g in sodium chloride 0.9 % 100 mL IVPB  Status:  Discontinued        01/20/20 1042 01/22/20 1634   01/20/20 1130  azithromycin (ZITHROMAX) 500 mg in sodium chloride 0.9 % 250 mL IVPB  Status:  Discontinued        01/20/20 1042 01/22/20 1634   01/15/20 1000  remdesivir 100 mg in sodium chloride 0.9 % 100 mL IVPB       "Followed by" Linked Group Details   12/23/2019 1727 01/18/20 1922   01/07/2020 1830  remdesivir 200 mg in sodium chloride 0.9% 250 mL IVPB        "Followed by" Linked Group Details   12/15/2019 1727 01/13/2020 1915       Devices    LINES / TUBES:      Continuous Infusions: . sodium chloride Stopped (02/01/20 1402)  . feeding supplement (OSMOLITE 1.5 CAL) 1,000 mL (02/02/20 0831)     Objective: Vitals:   02/03/20 0500 02/03/20 0700 02/03/20 0726 02/03/20 0800  BP: (!) 166/61 (!) 149/54  (!) 163/52  Pulse: 100 (!) 105  (!) 115  Resp: 17 14  13   Temp:    98.9 F (37.2 C)  TempSrc:    Axillary  SpO2: 100% 100% 98% 100%  Weight:      Height:        Intake/Output Summary (Last 24 hours) at 02/03/2020 0917 Last data filed at 02/03/2020 0400 Gross per 24 hour  Intake 149.67 ml  Output 900 ml  Net -750.33 ml   02/05/2020  Weights   01/31/20 0500 02/01/20 0500 02/02/20 0500  Weight: 109.8 kg 108.5 kg 108.7 kg   Physical Exam:  General: A/O x4, positive acute respiratory distress, comments dysuria Eyes: negative scleral hemorrhage, negative anisocoria, negative icterus ENT: Negative Runny nose, negative gingival bleeding, Neck:  Negative scars, masses, torticollis, lymphadenopathy, JVD Lungs: decreased breath sounds bilaterally without wheezes or crackles Cardiovascular: Regular rate and rhythm without murmur gallop or rub normal S1 and S2 Abdomen: MORBIDLY OBESE, negative abdominal pain, nondistended, positive soft, bowel sounds, no rebound, no ascites, no appreciable mass Extremities: No significant cyanosis, clubbing, or edema bilateral lower extremities Skin: Negative rashes, lesions, ulcers Psychiatric:  Negative depression, negative anxiety, negative fatigue, negative mania  Central nervous system:  Cranial nerves II through XII intact, tongue/uvula midline, all extremities muscle strength 5/5, sensation intact throughout, negative dysarthria, negative expressive aphasia, negative receptive aphasia.   .     Data Reviewed: Care during the described time interval was provided by me .  I have reviewed this  patient's available data, including medical history, events of note, physical examination, and all test results as part of my evaluation.   CBC: Recent Labs  Lab 01/30/20 0222 01/31/20 0239 02/01/20 0304 02/02/20 0043 02/03/20 0250  WBC 34.0* 29.2* 20.2* 22.4* 16.6*  NEUTROABS 30.6* 24.1* 16.2* 18.4* 13.7*  HGB 11.2* 11.0* 9.9* 10.5* 9.2*  HCT 35.0* 35.0* 31.3* 32.6* 30.1*  MCV 95.4 95.9 94.8 95.3 98.4  PLT 176 152 117* 128* 113*   Basic Metabolic Panel: Recent Labs  Lab 01/30/20 0222 01/31/20 0239 02/01/20 0304 02/02/20 0043 02/03/20 0250  NA 136 139 142 144 147*  K 4.2 3.9 3.6 3.7 4.1  CL 100 103 104 107 111  CO2 25 23 28 27 27   GLUCOSE 124* 118* 136* 102* 137*  BUN 23 28* 35* 37* 34*  CREATININE 0.88 0.98 0.99 0.94 0.81  CALCIUM 8.4* 8.4* 8.5* 8.6* 8.9  MG 2.2 2.2 2.3 2.0 2.0  PHOS 2.8 3.7 4.1 3.3 3.5   GFR: Estimated Creatinine Clearance: 87.9 mL/min (by C-G formula based on SCr of 0.81 mg/dL). Liver Function Tests: Recent Labs  Lab 01/30/20 0222 01/31/20 0239 02/01/20 0304 02/02/20 0043 02/03/20 0250  AST 21 23 17 31  37  ALT 67* 59* 51* 61* 57*  ALKPHOS 61 65 62 71 61  BILITOT 1.2 0.7 1.1 1.1 0.6  PROT 5.5* 5.8* 5.4* 5.7* 5.6*  ALBUMIN 2.4* 2.5* 2.4* 2.5* 2.7*   No results for input(s): LIPASE, AMYLASE in the last 168 hours. No results for input(s): AMMONIA in the last 168 hours. Coagulation Profile: No results for input(s): INR, PROTIME in the last 168 hours. Cardiac Enzymes: No results for input(s): CKTOTAL, CKMB, CKMBINDEX, TROPONINI in the last 168 hours. BNP (last 3 results) No results for input(s): PROBNP in the last 8760 hours. HbA1C: No results for input(s): HGBA1C in the last 72 hours. CBG: Recent Labs  Lab 02/01/20 2103 02/02/20 0742 02/02/20 1151 02/02/20 1641 02/02/20 2151  GLUCAP 112* 113* 200* 134* 114*   Lipid Profile: No results for input(s): CHOL, HDL, LDLCALC, TRIG, CHOLHDL, LDLDIRECT in the last 72 hours. Thyroid  Function Tests: No results for input(s): TSH, T4TOTAL, FREET4, T3FREE, THYROIDAB in the last 72 hours. Anemia Panel: Recent Labs    02/02/20 0043 02/03/20 0250  FERRITIN 485* 397*   Urine analysis:    Component Value Date/Time   COLORURINE AMBER (A) 02/01/2020 1614   APPEARANCEUR HAZY (A) 02/01/2020 1614   LABSPEC 1.019 02/01/2020 1614  PHURINE 5.0 02/01/2020 1614   GLUCOSEU NEGATIVE 02/01/2020 1614   HGBUR NEGATIVE 02/01/2020 1614   BILIRUBINUR NEGATIVE 02/01/2020 1614   KETONESUR NEGATIVE 02/01/2020 1614   PROTEINUR NEGATIVE 02/01/2020 1614   NITRITE NEGATIVE 02/01/2020 1614   LEUKOCYTESUR SMALL (A) 02/01/2020 1614   Sepsis Labs: @LABRCNTIP (procalcitonin:4,lacticidven:4)  ) Recent Results (from the past 240 hour(s))  Culture, Urine     Status: Abnormal (Preliminary result)   Collection Time: 02/01/20  4:14 PM   Specimen: Urine, Random  Result Value Ref Range Status   Specimen Description   Final    URINE, RANDOM Performed at [facility], [address] [place], [place]    Special Requests   Final    NONE Performed at [facility], [address] [place], [place]    Culture (A)  Final    >=100,000 COLONIES/mL GRAM POSITIVE COCCI SUSCEPTIBILITIES TO FOLLOW Performed at [facility], [address] [place], [place]    Report Status PENDING  Incomplete         Radiology Studies: No results found.      Scheduled Meds: . chlorhexidine  15 mL Mouth Rinse BID  . Chlorhexidine Gluconate Cloth  6 each Topical Daily  . enoxaparin (LOVENOX) injection  60 mg Subcutaneous Q24H  . feeding supplement (PROSource TF)  45 mL Per Tube TID  . Gerhardt's butt cream   Topical BID  . insulin aspart  0-9 Units Subcutaneous TID WC  . mouth rinse  15 mL Mouth Rinse BID  . [START ON 02/04/2020] methylPREDNISolone (SOLU-MEDROL) injection  20 mg Intravenous Q24H  . nystatin   Topical BID  .  potassium chloride  50 mEq Per Tube Daily  . QUEtiapine  25 mg Per Tube QHS   Continuous Infusions: . sodium chloride Stopped (02/01/20 1402)  . feeding supplement (OSMOLITE 1.5 CAL) 1,000 mL (02/02/20 0831)     LOS: 20 days   The patient is critically ill with multiple organ systems failure and requires high complexity decision making for assessment and support, frequent evaluation and titration of therapies, application of advanced monitoring technologies and extensive interpretation of multiple databases. Critical Care Time devoted to patient care services described in this note  Time spent: 40 minutes     Yuki Brunsman, 02/04/20, MD [facility] Pager [phone]  If 7PM-7AM, please contact night-coverage [email or web address] Password [facility] 02/03/2020, 9:17 AM

## 2020-02-03 NOTE — Progress Notes (Signed)
RN reports pt remains agitated, appears to be worse when she needs to urinate.  Patient not able to verbalize pain scale or why she is uncomfortable but can answer orientation questions per RN.  Husband notes that she appears uncomfortable when urinating.     Note UC with 100k staph epidermis.  Tmax 101, WBC 16.6, mild left shift on CBC.     Plan: 3 days vancomycin, lower limit dosing. Reviewed with pharmacy.  1 x dose pyridium to see if agitation improves / may be pain that she is unable to verbalize.  Monitor symptoms   Canary Brim, MSN, NP-C [facility] 02/03/2020, 1:47 PM   Please see [email or web address] for pager details.

## 2020-02-03 NOTE — Progress Notes (Addendum)
Physical Therapy Treatment Patient Details Name: Julie Mcguire MRN: [id number] DOB: 01-Jun-1955 Today's Date: 02/03/2020    History of Present Illness Julie Mcguire is a 64 y.o. female with medical history significant of recent Covid pneumonia, hypertension, moderate obesity who presented to the emergency room with complaints of worsening shortness of breath.  Recently discharged from the hospital on 01/10/2020 following treatment for Covid-19 viral pneumonia.She was discharged on 4 L of oxygen and returned 8/31 with worsening short of breath. Has required Precedex for agitation, restraints for safety and continues use of HHFNC.  8/24-8/27 Admit with COVID 8/31 Admit with worsening shortness of breath 9/14 PCCM consulted for evaluation of encephalopathy and hypoxic respiratory failure 9/15 Precedex started 9/16 HHFNC + NRB intermittent , precedex  9/19 remains on intermittent precedex, off Covid/airborne precautions, on HHFNC   PT Comments    Pt on 30L HHFNC with FiO2 80% when seen by PT.  SpO2 maintaining in 90s,  HR 90 to 118.  Provided multi-modal stimuli to arouse pt to participate in bed level activity. Limited by cognition/varying level of arousal. Continue to recommend SNF post acute    Follow Up Recommendations  SNF     Equipment Recommendations  None recommended by PT    Recommendations for Other Services       Precautions / Restrictions Precautions Precautions: Fall Precaution Comments: feeding tube, Monitor O2, VS    Mobility  Bed Mobility Overal bed mobility: Needs Assistance Bed Mobility: Rolling Rolling: Total assist;+2 for physical assistance;+2 for safety/equipment         General bed mobility comments: total assist to roll and reposition in bed  Transfers                    Ambulation/Gait                 Stairs             Wheelchair Mobility    Modified Rankin (Stroke Patients Only)       Balance                                             Cognition Arousal/Alertness: lethargic Behavior During Therapy: Restless;Flat affect Overall Cognitive Status: Impaired/Different from baseline Area of Impairment: Following commands;Problem solving;Safety/judgement;Orientation                 Orientation Level: Disoriented to;Situation;Time     Following Commands: Follows one step commands inconsistently Safety/Judgement: Decreased awareness of safety;Decreased awareness of deficits   Problem Solving: Difficulty sequencing;Requires verbal cues;Requires tactile cues;Slow processing;Decreased initiation        Exercises General Exercises - Upper Extremity Shoulder Flexion: AAROM;Both;10 reps;PROM Shoulder Horizontal ADduction: AAROM;Both;10 reps;PROM Elbow Flexion: AAROM;Both;10 reps Elbow Extension: AAROM;PROM;10 reps;Both Wrist Flexion: PROM;AAROM;Both;10 reps Wrist Extension: PROM;AAROM;Both;10 reps General Exercises - Lower Extremity Ankle Circles/Pumps: PROM;Both;5 reps Heel Slides: AROM;PROM;Both;5 reps Other Exercises Other Exercises: IR bil hips AA/PROM x 5 AAROM lateral cervical flexion, focused on stretching right side as pt tends to position in R lateral flexion and rotation to right    General Comments        Pertinent Vitals/Pain Pain Assessment: Faces Faces Pain Scale: Hurts little more Pain Location: diffuse, grimaces with imposed movement.  verbalizes "burning" with urination Pain Descriptors / Indicators: Grimacing Pain Intervention(s): Limited activity within patient's tolerance    Home Living  Prior Function            PT Goals (current goals can now be found in the care plan section) Acute Rehab PT Goals Patient Stated Goal: To improve mobility and strength PT Goal Formulation: With patient Time For Goal Achievement: 02/17/20 Potential to Achieve Goals: Good Progress towards PT goals: Progressing toward goals     Frequency    Min 2X/week      PT Plan Frequency needs to be updated    Co-evaluation              AM-PAC PT "6 Clicks" Mobility   Outcome Measure  Help needed turning from your back to your side while in a flat bed without using bedrails?: Total Help needed moving from lying on your back to sitting on the side of a flat bed without using bedrails?: Total Help needed moving to and from a bed to a chair (including a wheelchair)?: Total Help needed standing up from a chair using your arms (e.g., wheelchair or bedside chair)?: Total Help needed to walk in hospital room?: Total Help needed climbing 3-5 steps with a railing? : Total 6 Click Score: 6    End of Session Equipment Utilized During Treatment: Oxygen (HHFNC) Activity Tolerance: Other (comment) (limited by confusion/diminished cognition) Patient left: in bed;with call bell/phone within reach;with bed alarm set;with family/visitor present Nurse Communication: Mobility status PT Visit Diagnosis: Muscle weakness (generalized) (M62.81);Other abnormalities of gait and mobility (R26.89)     Time: 6967-8938 PT Time Calculation (min) (ACUTE ONLY): 21 min  Charges:  $Therapeutic Exercise: 8-22 mins                     Delice Bison, PT  Acute Rehab Dept [facility]) [phone] Pager [phone]  02/03/2020    [facility] 02/03/2020, 12:53 PM

## 2020-02-03 NOTE — Progress Notes (Signed)
Nutrition Follow-up  DOCUMENTATION CODES:   Morbid obesity  INTERVENTION:  - increase Osmolite 1.5 to 35 ml/hr and advance by 10 ml every 8 hours to goal rate of 55 ml/hr with 45 ml Prosource TF TID and 200 ml free water every 4 hours.  - at goal rate, this regimen will provide 2100 kcal, 116 grams protein, and 2206 ml free water.   NUTRITION DIAGNOSIS:   Inadequate oral intake related to inability to eat as evidenced by NPO status. -ongoing  GOAL:   Patient will meet greater than or equal to 90% of their needs -unmet with current TF rate  MONITOR:   TF tolerance, Labs, Weight trends  ASSESSMENT:   64 yo unvaccinated female admitted with acute respiratory failure with referactory COVID-19 pneumonia on 8/31. PMH includes HTN.  Patient remains NPO and SLF was unable to work with patient this AM due to several factors. Patient has small bore NGT in R nare which was placed by IR on 9/15. She is receiving Osmolite 1.5 @ 20 ml/hr with 45 ml Prosource TF TID. This regimen is providing 840 kcal, 63 grams protein, and 366 ml free water.   Able to complete NFPE today; no muscle or fat depletions, mild edema to all extremities. Weight continues to trend slowly down. Noted one time does of lasix given this AM.   Her husband is at bedside and is interested in her being able to eat as soon as is safe for her to do so.   Per notes: - ARDS - now off of precautions related to COVID (positive on 8/24) - transaminitis--resolving      Labs reviewed; CBG: 144 mg/dl, Na: 638 mmol/l, BUN: 34 mmol/l, ALT elevated.  Medications reviewed; 60 mg IV lasix x1 dose 9/20, sliding scale novolog, 20 mg solu-medrol/day, 50 mEq KCl per NGT/day.    Diet Order:   Diet Order            Diet NPO time specified Except for: Sips with Meds, Ice Chips  Diet effective now                 EDUCATION NEEDS:   Not appropriate for education at this time  Skin:  Skin Assessment: Skin Integrity Issues: Skin  Integrity Issues:: Other (Comment) Other: MASD: skin folds, perineal area  Last BM:  9/20 (type 7)  Height:   Ht Readings from Last 1 Encounters:  01/15/20 5\' 4"  (1.626 m)    Weight:   Wt Readings from Last 1 Encounters:  02/02/20 108.7 kg     Estimated Nutritional Needs:  Kcal:  1900-2250 kcals Protein:  110-125 g Fluid:  >/= 2 L     02/04/20, MS, RD, LDN, CNSC Inpatient Clinical Dietitian RD pager # available in AMION  After hours/weekend pager # available in [facility]

## 2020-02-03 NOTE — Progress Notes (Signed)
PROGRESS NOTE    Julie MarionDeborah M Mcguire  JXB:[id number] DOB: [birth year] DOA: 12/27/2019 PCP: Elias Elseeade, Robert, MD   Brief Narrative:  [phone] WF PMHx HTN, and obesity  Recent COVID pneumonia,  who returned to the ED with worsening shortness of breath. She was discharged from the hospital 8/27 following treatment for Covid pneumonia with steroids, remdesivir, and baricitinib.  At the time of discharge she required 4 L oxygen support. When EMS arrived at her home she was found to be saturating 75% on 6 L of nasal cannula and required 15 L via nonrebreather mask to get saturations up to 85%. In the ED CXR noted moderate to severe bilateral patchy infiltrates and the patient was readmitted to the hospital with refractory Covid pneumonia.   Subjective: 9/20 T-max overnight 38.3 C, A/O x3 (does not know where) but does know she is in the hospital.  Husband remains at bedside again I believe this is helping her cognition.  Follows all commands complains of dysuria   Assessment & Plan:  Covid vaccination;  Principal Problem:   Acute respiratory failure with hypoxia (HCC) Active Problems:   Pneumonia due to COVID-19 virus   Essential hypertension   Obesity, Class III, BMI 40-49.9 (morbid obesity) (HCC)   COVID-19   SIRS (systemic inflammatory response syndrome) (HCC)   Goals of care, counseling/discussion   Palliative care by specialist   DNR (do not resuscitate) discussion   Secondary bacterial pneumonia   Hypokalemia   Toxic metabolic encephalopathy   Bacterial pneumonia   Transaminitis   Sinus tachycardia   Acute lower UTI   Acute respiratory failure with hypoxia- COVID Pneumonia  Recent Labs    02/01/20 0304 02/02/20 0043 02/03/20 0250  DDIMER 0.94* 1.36* 0.87*  FERRITIN 586* 485* 397*  LDH 212* 260* 242*  CRP 3.9* 3.6* 4.1*    Lab Results  Component Value Date   SARSCOV2NAA POSITIVE (A) 01/07/2020   -Solu-Medrol 20 mg daily 8/24 > 8/27 + 8/31 > -Baricitinib 8/24 > 8/27 +  8/31 > 9/9 -Remdesivir 8/24 > 8/27 + 8/31 > 9/4 -9/17 PCXR stable pneumonia see results below -9/19 DuoNeb QID  Possible secondary bacterial pneumonia -Procalcitonin not convincing of bacterial pneumonia  -discontinued empiric antibiotics after 3 complete days of treatment  Hyperthermia ---2dary to infection? --spikeed fever 9/19 T-max 38.2 C, --9/18 UA pending -- 9/19 obtain Blood culture  --obtain Lactic Acid   UTI positive staph epidermidis -low dose vanco for 3 days and 1x pyridium 200  Toxic metabolic encephalopathy -CT head without acute findings -9/15 most likely secondary to sedating medication (Precedex) and acute delirium of prolonged critical illness. -9/15 attempting to wean patient off Precedex but patient becomes agitated and combative. -Still unable to place NG tube, therefore will not be able to use Seroquel, as medication for agitation in order to wean Precedex off. -9/15 obtain EKG and if no QT prolongation will start Haldol and attempt to wean Precedex. -9/16 patient's encephalopathy appears to be improving.  We will hold on starting Haldol, patient's family is supposed to be visiting today we will see if her cognition improves hopefully will be able to wean Precedex without adding any additional agents.. -9/17 Seroquel 25 mg QHS -9/18 wean patient off Precedex as tolerated.  Sinus Tachycardia/HTN --9/20 Metoprolol 12.5 mg per tube q 8 hr -Hydralazine PRN -9/20 Lasix IV 60 mg -Strict in and out +4.6 L -Daily weight Filed Weights   01/31/20 0500 02/01/20 0500 02/02/20 0500  Weight: 109.8 kg 108.5 kg 108.7  kg     Nutrition -Bedside placement appended to x3 have failed. -IR placement of NG tube cannot be accomplished secondary to hardware failure.  Hopefully by 9/16 there hardware will be repaired. -D5W 54ml/hr -9/17 IR to place NG tube; proximal stomach -9/20 increase feeds to goal, as patient has tolerated trickle feeds without incident.   -Continue  aspiration precautions -Maintain head of bed> 30 degrees at all times  Transaminitis -Resolved except for ALT still mildly elevated Lab Results  Component Value Date   ALT 57 (H) 02/03/2020   ALT 61 (H) 02/02/2020   ALT 51 (H) 02/01/2020   ALT 59 (H) 01/31/2020   ALT 67 (H) 01/30/2020  -Slight increase, however remainder of liver enzymes WNL  Hypokalemia -Potassium goal> 4     DVT prophylaxis: Lovenox Code Status: Full Family Communication: 9/20 husband at bedside discussed plan of care answered all questions Status is: Inpatient    Dispo: The patient is from: Home              Anticipated d/c is to: Home              Anticipated d/c date is: ??              Patient currently severely unstable      Consultants:  PCCM   Procedures/Significant Events:  8/24 initial admission to hospital - steroids, remdesivir, and baricitinib 8/27 discharge home after treatment for Covid pneumonia 8/31 readmit for refractory Covid pneumonia 9/17 PCXR;Stable bilateral multifocal pneumonia. 9/18 UTI   I have personally reviewed and interpreted all radiology studies and my findings are as above.  VENTILATOR SETTINGS: HHFNC +NRB 9/20 FiO2; 80% Flow; 30 L/min SPO2 98%   Cultures 8/24 SARS coronavirus positive 9/18 urine positive staph epidermidis 9/19 blood pending    Antimicrobials: Anti-infectives (From admission, onward)   Start     Ordered Stop   01/20/20 1130  cefTRIAXone (ROCEPHIN) 2 g in sodium chloride 0.9 % 100 mL IVPB  Status:  Discontinued        01/20/20 1042 01/22/20 1634   01/20/20 1130  azithromycin (ZITHROMAX) 500 mg in sodium chloride 0.9 % 250 mL IVPB  Status:  Discontinued        01/20/20 1042 01/22/20 1634   01/15/20 1000  remdesivir 100 mg in sodium chloride 0.9 % 100 mL IVPB       "Followed by" Linked Group Details   12/20/2019 1727 01/18/20 1922   01/11/2020 1830  remdesivir 200 mg in sodium chloride 0.9% 250 mL IVPB       "Followed by" Linked  Group Details   12/25/2019 1727 01/06/2020 1915       Devices    LINES / TUBES:      Continuous Infusions: . sodium chloride Stopped (02/01/20 1402)  . feeding supplement (OSMOLITE 1.5 CAL) 35 mL/hr at 02/03/20 1149  . vancomycin 1,250 mg (02/03/20 1427)     Objective: Vitals:   02/03/20 1200 02/03/20 1300 02/03/20 1400 02/03/20 1407  BP: 123/65 131/67 (!) 154/41   Pulse: (!) 119 (!) 123 (!) 133 (!) 127  Resp: (!) 22 20 (!) 26 (!) 24  Temp: 98.6 F (37 C)     TempSrc: Oral     SpO2: 100% 100% (!) 85% 96%  Weight:      Height:        Intake/Output Summary (Last 24 hours) at 02/03/2020 1437 Last data filed at 02/03/2020 1211 Gross per 24 hour  Intake 269.67  ml  Output 2150 ml  Net -1880.33 ml   Filed Weights   01/31/20 0500 02/01/20 0500 02/02/20 0500  Weight: 109.8 kg 108.5 kg 108.7 kg   Physical Exam:  General: A/O x3 (does not know where) but does know she is in the hospital, positive acute respiratory distress, comments dysuria Eyes: negative scleral hemorrhage, negative anisocoria, negative icterus ENT: Negative Runny nose, negative gingival bleeding, Neck:  Negative scars, masses, torticollis, lymphadenopathy, JVD Lungs: decreased breath sounds bilaterally without wheezes or crackles Cardiovascular: Regular rate and rhythm without murmur gallop or rub normal S1 and S2 Abdomen: MORBIDLY OBESE, negative abdominal pain, nondistended, positive soft, bowel sounds, no rebound, no ascites, no appreciable mass Extremities: No significant cyanosis, clubbing, or edema bilateral lower extremities Skin: Negative rashes, lesions, ulcers Psychiatric:  Negative depression, negative anxiety, negative fatigue, negative mania  Central nervous system:  Cranial nerves II through XII intact, tongue/uvula midline, all extremities muscle strength 5/5, sensation intact throughout, negative dysarthria, negative expressive aphasia, negative receptive aphasia.   .     Data  Reviewed: Care during the described time interval was provided by me .  I have reviewed this patient's available data, including medical history, events of note, physical examination, and all test results as part of my evaluation.   CBC: Recent Labs  Lab 01/30/20 0222 01/31/20 0239 02/01/20 0304 02/02/20 0043 02/03/20 0250  WBC 34.0* 29.2* 20.2* 22.4* 16.6*  NEUTROABS 30.6* 24.1* 16.2* 18.4* 13.7*  HGB 11.2* 11.0* 9.9* 10.5* 9.2*  HCT 35.0* 35.0* 31.3* 32.6* 30.1*  MCV 95.4 95.9 94.8 95.3 98.4  PLT 176 152 117* 128* 113*   Basic Metabolic Panel: Recent Labs  Lab 01/30/20 0222 01/31/20 0239 02/01/20 0304 02/02/20 0043 02/03/20 0250  NA 136 139 142 144 147*  K 4.2 3.9 3.6 3.7 4.1  CL 100 103 104 107 111  CO2 25 23 28 27 27   GLUCOSE 124* 118* 136* 102* 137*  BUN 23 28* 35* 37* 34*  CREATININE 0.88 0.98 0.99 0.94 0.81  CALCIUM 8.4* 8.4* 8.5* 8.6* 8.9  MG 2.2 2.2 2.3 2.0 2.0  PHOS 2.8 3.7 4.1 3.3 3.5   GFR: Estimated Creatinine Clearance: 87.9 mL/min (by C-G formula based on SCr of 0.81 mg/dL). Liver Function Tests: Recent Labs  Lab 01/30/20 0222 01/31/20 0239 02/01/20 0304 02/02/20 0043 02/03/20 0250  AST 21 23 17 31  37  ALT 67* 59* 51* 61* 57*  ALKPHOS 61 65 62 71 61  BILITOT 1.2 0.7 1.1 1.1 0.6  PROT 5.5* 5.8* 5.4* 5.7* 5.6*  ALBUMIN 2.4* 2.5* 2.4* 2.5* 2.7*   No results for input(s): LIPASE, AMYLASE in the last 168 hours. No results for input(s): AMMONIA in the last 168 hours. Coagulation Profile: No results for input(s): INR, PROTIME in the last 168 hours. Cardiac Enzymes: No results for input(s): CKTOTAL, CKMB, CKMBINDEX, TROPONINI in the last 168 hours. BNP (last 3 results) No results for input(s): PROBNP in the last 8760 hours. HbA1C: No results for input(s): HGBA1C in the last 72 hours. CBG: Recent Labs  Lab 02/02/20 1151 02/02/20 1641 02/02/20 2151 02/03/20 0735 02/03/20 1210  GLUCAP 200* 134* 114* 144* 229*   Lipid Profile: No results  for input(s): CHOL, HDL, LDLCALC, TRIG, CHOLHDL, LDLDIRECT in the last 72 hours. Thyroid Function Tests: No results for input(s): TSH, T4TOTAL, FREET4, T3FREE, THYROIDAB in the last 72 hours. Anemia Panel: Recent Labs    02/02/20 0043 02/03/20 0250  FERRITIN 485* 397*   Urine analysis:  Component Value Date/Time   COLORURINE AMBER (A) 02/01/2020 1614   APPEARANCEUR HAZY (A) 02/01/2020 1614   LABSPEC 1.019 02/01/2020 1614   PHURINE 5.0 02/01/2020 1614   GLUCOSEU NEGATIVE 02/01/2020 1614   HGBUR NEGATIVE 02/01/2020 1614   BILIRUBINUR NEGATIVE 02/01/2020 1614   KETONESUR NEGATIVE 02/01/2020 1614   PROTEINUR NEGATIVE 02/01/2020 1614   NITRITE NEGATIVE 02/01/2020 1614   LEUKOCYTESUR SMALL (A) 02/01/2020 1614   Sepsis Labs: @LABRCNTIP (procalcitonin:4,lacticidven:4)  ) Recent Results (from the past 240 hour(s))  Culture, Urine     Status: Abnormal (Preliminary result)   Collection Time: 02/01/20  4:14 PM   Specimen: Urine, Random  Result Value Ref Range Status   Specimen Description   Final    URINE, RANDOM Performed at [facility], [address], [place], [place]    Special Requests   Final    NONE Performed at [facility], [address] [place], [place]    Culture (A)  Final    >=100,000 COLONIES/mL STAPHYLOCOCCUS EPIDERMIDIS SUSCEPTIBILITIES TO FOLLOW Performed at [facility], [address], [place], [place]    Report Status PENDING  Incomplete         Radiology Studies: No results found.      Scheduled Meds: . chlorhexidine  15 mL Mouth Rinse BID  . Chlorhexidine Gluconate Cloth  6 each Topical Daily  . enoxaparin (LOVENOX) injection  60 mg Subcutaneous Q24H  . feeding supplement (PROSource TF)  45 mL Per Tube TID  . free water  200 mL Per Tube Q4H  . Gerhardt's butt cream   Topical BID  . insulin aspart  0-9 Units Subcutaneous TID WC  . mouth rinse  15 mL Mouth Rinse  BID  . [START ON 02/04/2020] methylPREDNISolone (SOLU-MEDROL) injection  20 mg Intravenous Q24H  . metoprolol tartrate  12.5 mg Per Tube TID  . nystatin   Topical BID  . potassium chloride  50 mEq Per Tube Daily  . QUEtiapine  25 mg Per Tube QHS   Continuous Infusions: . sodium chloride Stopped (02/01/20 1402)  . feeding supplement (OSMOLITE 1.5 CAL) 35 mL/hr at 02/03/20 1149  . vancomycin 1,250 mg (02/03/20 1427)     LOS: 20 days   The patient is critically ill with multiple organ systems failure and requires high complexity decision making for assessment and support, frequent evaluation and titration of therapies, application of advanced monitoring technologies and extensive interpretation of multiple databases. Critical Care Time devoted to patient care services described in this note  Time spent: 40 minutes     Jaivian Battaglini, 02/05/20, MD [facility] Pager [phone]  If 7PM-7AM, please contact night-coverage [email or web address] Password [facility] 02/03/2020, 2:37 PM

## 2020-02-03 NOTE — Progress Notes (Signed)
Pt had significant increase in HR 180-200's, STAT EKG done which showed A-fib RVR, APP notified, Diltiazem 10mg  given, Pt also spiked fever, tylenol given as well as one time dose Toradol 30mg . Pt anxious and pulling at masks and lines, morphine given which did not help much and APP made aware, one time dose Vistaril order placed and given. HR still elevated overnight in the 130s, metop PRN 5mg  given and HR decreased to low 100's.   Bladder scan showed , Intermittent cath done out.

## 2020-02-03 NOTE — Progress Notes (Signed)
SLP Cancellation Note  Patient Details Name: LYLIANA DICENSO MRN: [id number] DOB: May 12, 1956   Cancelled treatment:       Reason Eval/Treat Not Completed: Other (comment);Medical issues which prohibited therapy note events overnight with pt having agitation, pulling at lines, fever, elevated HR.  Has TF for nutrition, will continue efforts.   Rolena Infante, MS [facility] SLP Acute Rehab Services Office [phone]    Chales Abrahams 02/03/2020, 10:13 AM

## 2020-02-03 NOTE — TOC Progression Note (Signed)
Transition of Care [facility]) - Progression Note    Patient Details  Name: Julie Mcguire MRN: [id number] Date of Birth: 21-Feb-1956  Transition of Care [facility]) CM/SW Contact  Golda Acre, RN Phone Number: 02/03/2020, 8:32 AM  Clinical Narrative:    Melynda Keller female Temp 101, hfnrbmask at 30l/min, iv solu medrol,ivprecedex tube feedings/wbc-16.6, crp-4.1 and d.dimer 4.1/ Plan readmitted on [id number] after being treated for covid and dc on [id number]/  Follow for progression and dc needs was homne on o2 at 4l/min ac this admission.   Expected Discharge Plan: Home/Self Care Barriers to Discharge: Continued Medical Work up  Expected Discharge Plan and Services Expected Discharge Plan: Home/Self Care   Discharge Planning Services: CM Consult   Living arrangements for the past 2 months: Single Family Home                                       Social Determinants of Health (SDOH) Interventions    Readmission Risk Interventions No flowsheet data found.

## 2020-02-04 ENCOUNTER — Inpatient Hospital Stay (HOSPITAL_COMMUNITY): Payer: BC Managed Care – PPO

## 2020-02-04 DIAGNOSIS — I4891 Unspecified atrial fibrillation: Secondary | ICD-10-CM | POA: Diagnosis present

## 2020-02-04 LAB — URINE CULTURE: Culture: >=100000 — AB

## 2020-02-04 LAB — COMPREHENSIVE METABOLIC PANEL
ALT: 60 U/L — ABNORMAL HIGH (ref 0–44)
AST: 32 U/L (ref 15–41)
Albumin: 2.8 g/dL — ABNORMAL LOW (ref 3.5–5.0)
Alkaline Phosphatase: 67 U/L (ref 38–126)
Anion gap: 12 (ref 5–15)
BUN: 57 mg/dL — ABNORMAL HIGH (ref 8–23)
CO2: 26 mmol/L (ref 22–32)
Calcium: 9.3 mg/dL (ref 8.9–10.3)
Chloride: 108 mmol/L (ref 98–111)
Creatinine, Ser: 1.05 mg/dL — ABNORMAL HIGH (ref 0.44–1.00)
GFR calc Af Amer: >60 mL/min (ref >60)
GFR calc non Af Amer: 57 mL/min — ABNORMAL LOW (ref >60)
Glucose, Bld: 173 mg/dL — ABNORMAL HIGH (ref 70–99)
Potassium: 4.1 mmol/L (ref 3.5–5.1)
Sodium: 146 mmol/L — ABNORMAL HIGH (ref 135–145)
Total Bilirubin: 0.7 mg/dL (ref 0.3–1.2)
Total Protein: 5.8 g/dL — ABNORMAL LOW (ref 6.5–8.1)

## 2020-02-04 LAB — GLUCOSE, CAPILLARY
Glucose-Capillary: 148 mg/dL — ABNORMAL HIGH (ref 70–99)
Glucose-Capillary: 169 mg/dL — ABNORMAL HIGH (ref 70–99)
Glucose-Capillary: 125 mg/dL — ABNORMAL HIGH (ref 70–99)
Glucose-Capillary: 111 mg/dL — ABNORMAL HIGH (ref 70–99)

## 2020-02-04 LAB — CBC WITH DIFFERENTIAL/PLATELET
Abs Immature Granulocytes: 1.22 10*3/uL — ABNORMAL HIGH (ref 0.00–0.07)
Basophils Absolute: 0.1 10*3/uL (ref 0.0–0.1)
Basophils Relative: 0 %
Eosinophils Absolute: 0 10*3/uL (ref 0.0–0.5)
Eosinophils Relative: 0 %
HCT: 29.5 % — ABNORMAL LOW (ref 36.0–46.0)
Hemoglobin: 9.2 g/dL — ABNORMAL LOW (ref 12.0–15.0)
Immature Granulocytes: 5 %
Lymphocytes Relative: 3 %
Lymphs Abs: 0.6 10*3/uL — ABNORMAL LOW (ref 0.7–4.0)
MCH: 30.9 pg (ref 26.0–34.0)
MCHC: 31.2 g/dL (ref 30.0–36.0)
MCV: 99 fL (ref 80.0–100.0)
Monocytes Absolute: 1.8 10*3/uL — ABNORMAL HIGH (ref 0.1–1.0)
Monocytes Relative: 8 %
Neutro Abs: 19.7 10*3/uL — ABNORMAL HIGH (ref 1.7–7.7)
Neutrophils Relative %: 84 %
Platelets: 120 10*3/uL — ABNORMAL LOW (ref 150–400)
RBC: 2.98 MIL/uL — ABNORMAL LOW (ref 3.87–5.11)
RDW: 15.5 % (ref 11.5–15.5)
WBC: 23.4 10*3/uL — ABNORMAL HIGH (ref 4.0–10.5)
nRBC: 0.3 % — ABNORMAL HIGH (ref 0.0–0.2)

## 2020-02-04 LAB — FERRITIN: Ferritin: 362 ng/mL — ABNORMAL HIGH (ref 11–307)

## 2020-02-04 LAB — C-REACTIVE PROTEIN: CRP: 2.5 mg/dL — ABNORMAL HIGH (ref <1.0)

## 2020-02-04 LAB — PHOSPHORUS: Phosphorus: 3.9 mg/dL (ref 2.5–4.6)

## 2020-02-04 LAB — MAGNESIUM: Magnesium: 2.1 mg/dL (ref 1.7–2.4)

## 2020-02-04 LAB — D-DIMER, QUANTITATIVE: D-Dimer, Quant: 1.28 ug/mL-FEU — ABNORMAL HIGH (ref 0.00–0.50)

## 2020-02-04 LAB — LACTATE DEHYDROGENASE: LDH: 248 U/L — ABNORMAL HIGH (ref 98–192)

## 2020-02-04 MED ORDER — METOPROLOL TARTRATE 5 MG/5ML IV SOLN: 5.0000 mg | Freq: Four times a day (QID) | INTRAVENOUS | Status: DC

## 2020-02-04 MED ORDER — DEXMEDETOMIDINE HCL IN NACL 200 MCG/50ML IV SOLN: 0.4000 ug/kg/h | INTRAVENOUS | Status: DC

## 2020-02-04 MED ORDER — AMIODARONE HCL IN DEXTROSE 360-4.14 MG/200ML-% IV SOLN
30.0000 mg/h | INTRAVENOUS | Status: DC
  Administered 2020-02-04 – 2020-02-05 (×2): 30 mg/h via INTRAVENOUS
  Filled 2020-02-04: qty 200

## 2020-02-04 MED ORDER — LORAZEPAM 2 MG/ML IJ SOLN
2.0000 mg | INTRAMUSCULAR | Status: DC | PRN
  Administered 2020-02-04: 2 mg via INTRAVENOUS
  Administered 2020-02-05: 4 mg via INTRAVENOUS
  Filled 2020-02-04: qty 2
  Filled 2020-02-04: qty 1

## 2020-02-04 MED ORDER — HALOPERIDOL LACTATE 5 MG/ML IJ SOLN: 2.0000 mg | Freq: Once | INTRAMUSCULAR | Status: DC

## 2020-02-04 MED ORDER — ADENOSINE 6 MG/2ML IV SOLN
6.0000 mg | Freq: Once | INTRAVENOUS | Status: AC
  Administered 2020-02-04: 6 mg via INTRAVENOUS

## 2020-02-04 MED ORDER — LORAZEPAM 2 MG/ML IJ SOLN: 1.0000 mg | Freq: Once | INTRAMUSCULAR | Status: DC

## 2020-02-04 MED ORDER — ADENOSINE 6 MG/2ML IV SOLN
INTRAVENOUS | Status: AC
  Filled 2020-02-04: qty 2

## 2020-02-04 MED ORDER — AMIODARONE LOAD VIA INFUSION
150.0000 mg | Freq: Once | INTRAVENOUS | Status: AC
  Administered 2020-02-04: 150 mg via INTRAVENOUS
  Filled 2020-02-04: qty 83.34

## 2020-02-04 MED ORDER — LORAZEPAM 2 MG/ML IJ SOLN
INTRAMUSCULAR | Status: AC
  Filled 2020-02-04: qty 1

## 2020-02-04 MED ORDER — DILTIAZEM HCL-DEXTROSE 125-5 MG/125ML-% IV SOLN (PREMIX)
5.0000 mg/h | INTRAVENOUS | Status: DC
  Administered 2020-02-04: 5 mg/h via INTRAVENOUS
  Administered 2020-02-04 – 2020-02-08 (×13): 15 mg/h via INTRAVENOUS
  Filled 2020-02-04 (×14): qty 125

## 2020-02-04 MED ORDER — METOPROLOL TARTRATE 5 MG/5ML IV SOLN
5.0000 mg | Freq: Four times a day (QID) | INTRAVENOUS | Status: DC
  Administered 2020-02-05 (×3): 5 mg via INTRAVENOUS
  Filled 2020-02-04 (×4): qty 5

## 2020-02-04 MED ORDER — DILTIAZEM HCL 25 MG/5ML IV SOLN
10.0000 mg | Freq: Once | INTRAVENOUS | Status: AC
  Administered 2020-02-04: 10 mg via INTRAVENOUS
  Filled 2020-02-04: qty 5

## 2020-02-04 MED ORDER — AMIODARONE HCL IN DEXTROSE 360-4.14 MG/200ML-% IV SOLN
60.0000 mg/h | INTRAVENOUS | Status: AC
  Administered 2020-02-04 (×2): 60 mg/h via INTRAVENOUS
  Filled 2020-02-04 (×3): qty 200

## 2020-02-04 MED ORDER — METOPROLOL TARTRATE 5 MG/5ML IV SOLN
7.5000 mg | Freq: Three times a day (TID) | INTRAVENOUS | Status: DC
  Administered 2020-02-04 (×2): 7.5 mg via INTRAVENOUS
  Filled 2020-02-04 (×2): qty 10

## 2020-02-04 NOTE — Progress Notes (Signed)
At bedside to place PIV and patient refused.

## 2020-02-04 NOTE — Progress Notes (Signed)
NAME:  CAYA SOBERANIS, MRN:  [id number], DOB:  01/20/1956, LOS: 21 ADMISSION DATE:  12/20/2019, CONSULTATION DATE:  01/28/20 REFERRING MD:  Dr. Sharon Seller, CHIEF COMPLAINT:  SOB  Brief History   64 y/o F admitted with acute hypoxemic respiratory failure in the setting of COVID PNA    Past Medical History  HTN Obesity   Significant Hospital Events   8/24-8/27 >> Admit with COVID 8/31>> Admit with worsening shortness of breath 9/14 >> PCCM consulted for evaluation of encephalopathy and hypoxic respiratory failure 9/15 >> Precedex started 9/16 >> HHFNC + NRB intermittent , precedex  9/19 >> precedex off 9/21 >> new AF RVR, diltiazem gtt started  Consults:    Procedures:     Significant Diagnostic Tests:   CTA Chest 9/2 >> negative for acute PE to the segmental level, findings consistent with known COVID infection and superimposed pulmonary edema   Venous Duplex BLE 9/2 >> negative bilaterally   Micro Data:  COVID 8/24 >> negative  Strep Antigen 9/6 >> negative  U. Legionella 9/6 >> negative  UC 9/18 >> positive Staph Epi >> S- Vancomycin, Nitrofurantoin, Tetracycline  Antimicrobials:  Ceftriaxone 9/6 >> 9/8  Azithromycin 9/6 >> 9/8  Vancomycin 9/20 >> Interim history/subjective:   This morning pt. In new AF RVR with HR 150-230's, Dr. Joseph Art bedside.  Adenosine given 6 mg x 2, diltiazem drip started. Other VSS, pt alert & oriented with some intermittent confusion.  Objective   Blood pressure 136/64, pulse (!) 127, temperature 98.8 F (37.1 C), temperature source Oral, resp. rate (!) 23, height 5\' 4"  (1.626 m), weight 108.7 kg, SpO2 96 %.    FiO2 (%):  [70 %-80 %] 70 %   Intake/Output Summary (Last 24 hours) at 02/04/2020 0815 Last data filed at 02/04/2020 02/06/2020 Gross per 24 hour  Intake 402.5 ml  Output 2250 ml  Net -1847.5 ml   Filed Weights   01/31/20 0500 02/01/20 0500 02/02/20 0500  Weight: 109.8 kg 108.5 kg 108.7 kg   Examination:  General: Pt sitting in  bed, minimally interactive without stimulation HEENT: MM pink/moist, anicteric Neuro: A&O x 4, intermittently inappropriate, follows commands, PERRL CV: s1s2, newly irregular with apical auscultation, AF RVR on monitor/12 lead, +3 pulses Pulm: breath sounds diminished, unlabored on HHFNC & NRB (HR 150-230) GI: soft, active bowel sounds, nontender Skin: limited assessment, known MSAD Extremities: warm/dry, no edema, able to MAE to command  Labs/Imaging reviewed Sodium slightly elevated at 146, ALT 60 WBC elevated at 23.4 KUB overnight showing NGT in distal esophagus- NGT pulled  Resolved Hospital Problem list     Hypernatremia   Assessment & Plan:   Acute Hypoxic Respiratory Failure in the setting of COVID PNA / ARDS Treated with baricitinib, remdesivir & steroids during first admit. Initially treated with IV antibiotics x 3 days, PCT not convincing for acute bacterial component. Currently receiving steroids.  - Continue HHFNC with NRB PRN, wean O2 as tolerated - Intermittent CXR - Aggressive pulmonary hygiene as tolerated: IS 10 x Q 1h while awake - Solu-medrol continued, consider discontinuing this week  New Onset Atrial Fibrillation w/ RVR S/p Adenosine given 6 mg x 210 mg dilt. IVP given  - Diltiazem drip started & Lopressor dose increased 7.5 mg scheduled Q8, per primary - Diltiazem 10 mg IVP given with minimal effect - Amio bolus given & drip started - continuous cardiac monitoring  Staph Epi UTI Low grade fever, left-shift on CBC and signs of pain while voiding UC +  for > 100,000 colonies of Staph Epi  - Vancomycin started per Rx consult, 3 day treatment course - pyridium given x 1 dose - monitor fever curve, CBC and mental status  Acute Metabolic Encephalopathy- resolving Suspect multifactorial in the setting of hypoxia, steroids & medications More oriented this AM  - monitor mental status - Avoid Benzos - Off Precedex - Continue Seroquel @  night  Transaminitis- resolving - per primary team  Elevated D-Dimer in the setting of COVID Venous duplex- negative for DVT, CTa chest- negative for PE  - monitor d- dimer, if persistent elevation with fever consider repeat venous duplex  Steroid Induced Hyperglycemia - Q 4 CBG with SSI - consider discontinuing due to length of therapy  At Risk Malnutrition - Dietary consult in place - Tube feeds/supplement per primary team - SLP consult in place, eval when possible  Best practice:  Diet: TF / NPO Pain/Anxiety/Delirium protocol (if indicated): continued VAP protocol (if indicated): N/A DVT prophylaxis: Lovenox- per primary team GI prophylaxis: N/A Glucose control: SSI, range 140-180, Q 4 CBG Mobility: as tolerated, PT following Code Status: FULL Family Communication: updated per primary team Disposition: SDU  Critical care time: NA   Cheryll Cockayne Rust-Chester, AGACNP-BC [facility]    Please see Amion for pager details.

## 2020-02-04 NOTE — Progress Notes (Signed)
Pt remains in AF RVR s/p:  Adenosine 6 mg x 2, Diltiazem 10 mg IVP, Diltiazem drip @ max since 09:30, metoprolol IVP scheduled, Amio bolus & Amio drip @ 60 mg since 1200.  Cardiology consulted for additional input. Primary service notified.  Cheryll Cockayne Rust-Chester, AGACNP-BC [facility]    Please see Amion for pager details.

## 2020-02-04 NOTE — Progress Notes (Signed)
At 0900, patient's bedside monitor showed HR had jumped into the 190's. RN immediately ran into room and found pt to be SOB, denying chest pain. EKG was taken and showed A-fib with RVR. Dr. Joseph Art paged, and assessed pt at bedside. Defib pads also placed on patient's chest for monitoring. Orders received to give 6 mg Adenosine. When first dose ineffective, an additional 6 mg adenosine given. (See strips in patient's chart). HR returned to 200's, so Cardizem gtt added with orders to titrate as needed with adequate BP. Gtt is now maxed at 15. Pt remains in A-fib, but rate is down to 150-170's on the monitor. This RN will continue to carefully monitor pt.

## 2020-02-04 NOTE — Progress Notes (Signed)
Pt is now in a regular rhythm, but remains tachy in the 150's-160's. CCM notified by this RN. Scheduled metoprolol had just been given IVP. Cardizem injection ordered by CCM. This RN will continue to closely monitor.

## 2020-02-04 NOTE — Progress Notes (Signed)
  Speech Language Pathology Treatment: Dysphagia  Patient Details Name: Julie Mcguire MRN: [id number] DOB: [birth year] Today's Date: 02/04/2020 Time: 3382-5053 SLP Time Calculation (min) (ACUTE ONLY): 14 min  Assessment / Plan / Recommendation Clinical Impression  Pt today on HFNC only - not requiring NRB and is oriented to self, location and diagnosis.  She continues to desire to eat - and today seems she may be appropriate for intake.   Throat clearing noted during intake after thin water consumption after approx 30% of boluses, no s/s of aspiration with 4 ounces of nectar, single bolus of applesauce and 1 cracker bolus.  SLP informed pt of need to contact MD to determine care plan. All vitals remained stable during testing with no increase in heart rate, RR or desaturation.    Unfortunately within approx 5 minutes after completion of snack, pt's HR became elevated to 170's - RN immediately went into pt's room and care team arrived to provide care.  Concern for patient to have overtly silently aspirated given sudden change in status.    Recommend STRICT npo with oral care.    HPI HPI: Per [facility]CS notes" 64 y/o F, non-vaccinated,  who was recently admitted from 8/24-8/27 with COVID PNA who returned to the ER on 8/31 with reports of worsening shortness of breath and hypoxemia.  The patient was treated during the last admission with steroids, remdesivir and baricitinib.  At time of discharge she required 4L oxygen support.  On EMS arrival, she was found to have saturations of 75% on 6L and was escalated to NRB with improvement of saturations to 85%.  Initial CXR in the ER demonstrated bilateral infiltrates.  She was admitted to the hospital per [facility] for further care.  The patient was treated with IV steroids. D-dimer elevated, CT angio chest and lower extremity duplex negative. Strep antigen and urine legionella negative.  She had intermittent encephalopathy and was treated with IV ativan.  She remained  on NRB.  PCCM consulted 9/14 for evaluation of encephalopathy and hypoxic respiratory failure."  Pt had feeding tube attempt to be placed in IR however they documented it was coiled in hernia.  SLP swallow evaluation.      SLP Plan  Continue with current plan of care       Recommendations  Diet recommendations: NPO Medication Administration: Via alternative means                Oral Care Recommendations: Oral care QID SLP Visit Diagnosis: Dysphagia, unspecified (R13.10) Plan: Continue with current plan of care       GO                Chales Abrahams 02/04/2020, 9:09 AM   Rolena Infante, MS [facility] SLP Acute Rehab Services Office [phone]

## 2020-02-04 NOTE — Progress Notes (Signed)
Pt having n/v, coughing, gave zofran which helped temporarily,still having bouts of n/v, assessed at 0100, noted Panda was not at 66cm previously noted but now at 40cm, tried to advance as much as tolerated, pt agitated and still having n/v, KUB ordered and done then pt stated to charge nurse "I do not want this anymore" pulled Panda completely out. APP made aware. Pt A/O x4 and refusing Panda tube.

## 2020-02-04 NOTE — Progress Notes (Signed)
PROGRESS NOTE    Julie Mcguire  JSH:[id number] DOB: 16-Oct-1955 DOA: 01/29/2020 PCP: Elias Else, MD   Brief Narrative:  [phone] PMHx HTN, and obesity  Recent COVID pneumonia,  who returned to the ED with worsening shortness of breath. She was discharged from the hospital 8/27 following treatment for Covid pneumonia with steroids, remdesivir, and baricitinib.  At the time of discharge she required 4 L oxygen support. When EMS arrived at her home she was found to be saturating 75% on 6 L of nasal cannula and required 15 L via nonrebreather mask to get saturations up to 85%. In the ED CXR noted moderate to severe bilateral patchy infiltrates and the patient was readmitted to the hospital with refractory Covid pneumonia.   Subjective: 9/21 T-max overnight 38 C, overnight patient pulled out her Panda tube and stated she does not want it reinserted.  Patient bedside patient HR~220. 5 minutes after speech had evaluated patient with swallow study.  Most likely silent aspiration because of A. fib/flutter   Assessment & Plan:  Covid vaccination;  Principal Problem:   Acute respiratory failure with hypoxia (HCC) Active Problems:   Pneumonia due to COVID-19 virus   Essential hypertension   Obesity, Class III, BMI 40-49.9 (morbid obesity) (HCC)   COVID-19   SIRS (systemic inflammatory response syndrome) (HCC)   Goals of care, counseling/discussion   Palliative care by specialist   DNR (do not resuscitate) discussion   Secondary bacterial pneumonia   Hypokalemia   Toxic metabolic encephalopathy   Bacterial pneumonia   Transaminitis   Sinus tachycardia   Acute lower UTI   Atrial fibrillation with RVR (HCC)   Acute respiratory failure with hypoxia- COVID Pneumonia  Recent Labs    02/02/20 0043 02/03/20 0250 02/04/20 0232  DDIMER 1.36* 0.87* 1.28*  FERRITIN 485* 397* 362*  LDH 260* 242* 248*  CRP 3.6* 4.1* 2.5*    Lab Results  Component Value Date   SARSCOV2NAA POSITIVE  (A) 01/07/2020   -Solu-Medrol 20 mg daily 8/24 > 8/27 + 8/31 > -Baricitinib 8/24 > 8/27 + 8/31 > 9/9 -Remdesivir 8/24 > 8/27 + 8/31 > 9/4 -9/17 PCXR stable pneumonia see results below -9/19 DuoNeb QID  Possible secondary bacterial pneumonia -Procalcitonin not convincing of bacterial pneumonia  -discontinued empiric antibiotics after 3 complete days of treatment  Hyperthermia ---2dary to infection? --spikeed fever 9/19 T-max 38.2 C, --9/18 UA pending -- 9/19 obtain Blood culture  --obtain Lactic Acid   UTI positive staph epidermidis -low dose vanco for 3 days and 1x pyridium 200  Toxic metabolic encephalopathy -CT head without acute findings -9/15 most likely secondary to sedating medication (Precedex) and acute delirium of prolonged critical illness. -9/15 attempting to wean patient off Precedex but patient becomes agitated and combative. -Still unable to place NG tube, therefore will not be able to use Seroquel, as medication for agitation in order to wean Precedex off. -9/15 obtain EKG and if no QT prolongation will start Haldol and attempt to wean Precedex. -9/16 patient's encephalopathy appears to be improving.  We will hold on starting Haldol, patient's family is supposed to be visiting today we will see if her cognition improves hopefully will be able to wean Precedex without adding any additional agents.. -9/17 Seroquel 25 mg QHS -9/18 wean patient off Precedex as tolerated.  Sinus Tachycardia/HTN -See A. Fib/flutter  A. fib with RVR/flutter -9/21 post swallow study evaluation patient went into immediate HR 220. -Adenosine 6 mg x 2 administered in order to slow  rate down, to identify and it appears as atrial flutter with RVR/flutter -9/21 amiodarone drip -9/21 Cardizem drip initiated -9/21 DC PO Metoprolol---> Metoprolol IV 7.5 mg TID -Hydralazine PRN -Strict in and out +2.9 L -Daily weight Filed Weights   01/31/20 0500 02/01/20 0500 02/02/20 0500  Weight: 109.8  kg 108.5 kg 108.7 kg  -Lasix PRN -9/21 discussed case with Dr. Olga Millers cardiology, states do not add any additional medication at this time unless patient becomes unstable.  They will see patient in the A.m.  Nutrition -Bedside placement appended to x3 have failed. -IR placement of NG tube cannot be accomplished secondary to hardware failure.  Hopefully by 9/16 there hardware will be repaired. -D5W 74ml/hr -9/17 IR to place NG tube; proximal stomach -9/20 increase feeds to goal, as patient has tolerated trickle feeds without incident.   -Continue aspiration precautions -Maintain head of bed> 30 degrees at all times -9/21 patient's cognition waxes and wanes do not believe she is competent to make medical decisions.  Discussed replacing NG tube for nutritional purposes he concurs. -Once A. fib RVR/flutter under control place order for IR placement of NG tube CAN NOT be performed at bedside  Dysphagia -9/21 patient failed swallow evaluation, speech recommends to keep patient n.p.o.  Transaminitis -Resolved except for ALT still mildly elevated Lab Results  Component Value Date   ALT 60 (H) 02/04/2020   ALT 57 (H) 02/03/2020   ALT 61 (H) 02/02/2020   ALT 51 (H) 02/01/2020   ALT 59 (H) 01/31/2020  -Slight increase, however remainder of liver enzymes WNL  Hypokalemia -Potassium goal> 4     DVT prophylaxis: Lovenox Code Status: Full Family Communication: 9/21 consult Julie Mcguire (husband) on plan of care answered all questions.   Status is: Inpatient    Dispo: The patient is from: Home              Anticipated d/c is to: Home              Anticipated d/c date is: ??              Patient currently severely unstable      Consultants:  PCCM Dr. Olga Millers cardiology   Procedures/Significant Events:  8/24 initial admission to hospital - steroids, remdesivir, and baricitinib 8/27 discharge home after treatment for Covid pneumonia 8/31 readmit for refractory Covid  pneumonia 9/17 PCXR;Stable bilateral multifocal pneumonia. 9/18 UTI 9/21 failed swallow study---> A. fib RVR/flutter 9/21 consulted cardiology who will come by and see patient tomorrow on 9/22   I have personally reviewed and interpreted all radiology studies and my findings are as above.  VENTILATOR SETTINGS: HHFNC 9/21 FiO2; 70% Flow; 30 L/min SPO2 95%   Cultures 8/24 SARS coronavirus positive 9/18 urine positive staph epidermidis 9/19 blood pending    Antimicrobials: Anti-infectives (From admission, onward)   Start     Ordered Stop   01/20/20 1130  cefTRIAXone (ROCEPHIN) 2 g in sodium chloride 0.9 % 100 mL IVPB  Status:  Discontinued        01/20/20 1042 01/22/20 1634   01/20/20 1130  azithromycin (ZITHROMAX) 500 mg in sodium chloride 0.9 % 250 mL IVPB  Status:  Discontinued        01/20/20 1042 01/22/20 1634   01/15/20 1000  remdesivir 100 mg in sodium chloride 0.9 % 100 mL IVPB       "Followed by" Linked Group Details   01/12/2020 1727 01/18/20 1922   12/20/2019 1830  remdesivir 200 mg  in sodium chloride 0.9% 250 mL IVPB       "Followed by" Linked Group Details   February 01, 2020 1727 02/01/2020 1915       Devices    LINES / TUBES:      Continuous Infusions: . sodium chloride Stopped (02/04/20 1550)  . amiodarone 60 mg/hr (02/04/20 1554)   Followed by  . amiodarone    . diltiazem (CARDIZEM) infusion 15 mg/hr (02/04/20 1554)  . feeding supplement (OSMOLITE 1.5 CAL) 35 mL/hr at 02/03/20 1149  . vancomycin 166.7 mL/hr at 02/04/20 1554     Objective: Vitals:   02/04/20 1515 02/04/20 1530 02/04/20 1545 02/04/20 1600  BP: 138/61 (!) 137/50 (!) 121/57 111/67  Pulse: (!) 110 (!) 103 (!) 163 (!) 158  Resp: 18 17 19 17   Temp:    98.3 F (36.8 C)  TempSrc:    Axillary  SpO2: 97% 97% 95% 96%  Weight:      Height:        Intake/Output Summary (Last 24 hours) at 02/04/2020 1637 Last data filed at 02/04/2020 1554 Gross per 24 hour  Intake 657.4 ml  Output 1420 ml    Net -762.6 ml   Filed Weights   01/31/20 0500 02/01/20 0500 02/02/20 0500  Weight: 109.8 kg 108.5 kg 108.7 kg   Physical Exam:  General: A/O x3 (does not know where) but does know that she is in the hospital) positive No acute respiratory distress Eyes: negative scleral hemorrhage, negative anisocoria, negative icterus ENT: Negative Runny nose, negative gingival bleeding, Neck:  Negative scars, masses, torticollis, lymphadenopathy, JVD Lungs: tachypneic, decreased breath sounds bilaterally without wheezes or crackles Cardiovascular: Irregularly irregular rhythm and rate, without murmur gallop or rub normal S1 and S2 Abdomen: MORBIDLY OBESE, negative abdominal pain, nondistended, positive soft, bowel sounds, no rebound, no ascites, no appreciable mass Extremities: No significant cyanosis, clubbing, or edema bilateral lower extremities Skin: Negative rashes, lesions, ulcers Psychiatric:  Negative depression, negative anxiety, negative fatigue, negative mania  Central nervous system:  Cranial nerves II through XII intact, tongue/uvula midline, all extremities muscle strength 5/5, sensation intact throughout, negative dysarthria, negative expressive aphasia, negative receptive aphasia.   .     Data Reviewed: Care during the described time interval was provided by me .  I have reviewed this patient's available data, including medical history, events of note, physical examination, and all test results as part of my evaluation.   CBC: Recent Labs  Lab 01/31/20 0239 02/01/20 0304 02/02/20 0043 02/03/20 0250 02/04/20 0232  WBC 29.2* 20.2* 22.4* 16.6* 23.4*  NEUTROABS 24.1* 16.2* 18.4* 13.7* 19.7*  HGB 11.0* 9.9* 10.5* 9.2* 9.2*  HCT 35.0* 31.3* 32.6* 30.1* 29.5*  MCV 95.9 94.8 95.3 98.4 99.0  PLT 152 117* 128* 113* 120*   Basic Metabolic Panel: Recent Labs  Lab 01/31/20 0239 02/01/20 0304 02/02/20 0043 02/03/20 0250 02/04/20 0232  NA 139 142 144 147* 146*  K 3.9 3.6 3.7  4.1 4.1  CL 103 104 107 111 108  CO2 23 28 27 27 26   GLUCOSE 118* 136* 102* 137* 173*  BUN 28* 35* 37* 34* 57*  CREATININE 0.98 0.99 0.94 0.81 1.05*  CALCIUM 8.4* 8.5* 8.6* 8.9 9.3  MG 2.2 2.3 2.0 2.0 2.1  PHOS 3.7 4.1 3.3 3.5 3.9   GFR: Estimated Creatinine Clearance: 67.8 mL/min (A) (by C-G formula based on SCr of 1.05 mg/dL (H)). Liver Function Tests: Recent Labs  Lab 01/31/20 0239 02/01/20 0304 02/02/20 02/03/20 02/03/20 0250 02/04/20 02/05/20  AST 23 17 31  37 32  ALT 59* 51* 61* 57* 60*  ALKPHOS 65 62 71 61 67  BILITOT 0.7 1.1 1.1 0.6 0.7  PROT 5.8* 5.4* 5.7* 5.6* 5.8*  ALBUMIN 2.5* 2.4* 2.5* 2.7* 2.8*   No results for input(s): LIPASE, AMYLASE in the last 168 hours. No results for input(s): AMMONIA in the last 168 hours. Coagulation Profile: No results for input(s): INR, PROTIME in the last 168 hours. Cardiac Enzymes: No results for input(s): CKTOTAL, CKMB, CKMBINDEX, TROPONINI in the last 168 hours. BNP (last 3 results) No results for input(s): PROBNP in the last 8760 hours. HbA1C: No results for input(s): HGBA1C in the last 72 hours. CBG: Recent Labs  Lab 02/03/20 1210 02/03/20 1614 02/03/20 2146 02/04/20 0751 02/04/20 1135  GLUCAP 229* 174* 152* 148* 169*   Lipid Profile: No results for input(s): CHOL, HDL, LDLCALC, TRIG, CHOLHDL, LDLDIRECT in the last 72 hours. Thyroid Function Tests: No results for input(s): TSH, T4TOTAL, FREET4, T3FREE, THYROIDAB in the last 72 hours. Anemia Panel: Recent Labs    02/03/20 0250 02/04/20 0232  FERRITIN 397* 362*   Urine analysis:    Component Value Date/Time   COLORURINE AMBER (A) 02/01/2020 1614   APPEARANCEUR HAZY (A) 02/01/2020 1614   LABSPEC 1.019 02/01/2020 1614   PHURINE 5.0 02/01/2020 1614   GLUCOSEU NEGATIVE 02/01/2020 1614   HGBUR NEGATIVE 02/01/2020 1614   BILIRUBINUR NEGATIVE 02/01/2020 1614   KETONESUR NEGATIVE 02/01/2020 1614   PROTEINUR NEGATIVE 02/01/2020 1614   NITRITE NEGATIVE 02/01/2020 1614    LEUKOCYTESUR SMALL (A) 02/01/2020 1614   Sepsis Labs: @LABRCNTIP (procalcitonin:4,lacticidven:4)  ) Recent Results (from the past 240 hour(s))  Culture, Urine     Status: Abnormal   Collection Time: 02/01/20  4:14 PM   Specimen: Urine, Random  Result Value Ref Range Status   Specimen Description   Final    URINE, RANDOM Performed at [facility], [address] [place], [place]    Special Requests   Final    NONE Performed at [facility], [address], [place], [place]    Culture >=100,000 COLONIES/mL STAPHYLOCOCCUS EPIDERMIDIS (A)  Final   Report Status 02/04/2020 FINAL  Final   Organism ID, Bacteria STAPHYLOCOCCUS EPIDERMIDIS (A)  Final      Susceptibility   Staphylococcus epidermidis - MIC*    CIPROFLOXACIN >=8 RESISTANT Resistant     GENTAMICIN <=0.5 SENSITIVE Sensitive     NITROFURANTOIN <=16 SENSITIVE Sensitive     OXACILLIN >=4 RESISTANT Resistant     TETRACYCLINE 2 SENSITIVE Sensitive     VANCOMYCIN <=0.5 SENSITIVE Sensitive     TRIMETH/SULFA 80 RESISTANT Resistant     CLINDAMYCIN >=8 RESISTANT Resistant     RIFAMPIN <=0.5 SENSITIVE Sensitive     Inducible Clindamycin NEGATIVE Sensitive     * >=100,000 COLONIES/mL STAPHYLOCOCCUS EPIDERMIDIS  Culture, blood (routine x 2)     Status: None (Preliminary result)   Collection Time: 02/02/20 11:51 AM   Specimen: BLOOD  Result Value Ref Range Status   Specimen Description   Final    BLOOD LEFT ANTECUBITAL Performed at [facility], [address] [place], [place]    Special Requests   Final    BOTTLES DRAWN AEROBIC AND ANAEROBIC Blood Culture adequate volume Performed at [facility], [address], [place], [place]    Culture   Final    NO GROWTH 2 DAYS Performed at [facility], [address] [place], [place] [address]    Report Status PENDING  Incomplete  Culture, blood (routine x 2)      Status: None (Preliminary result)   Collection Time: 02/02/20 11:51 AM   Specimen: BLOOD  Result Value Ref Range Status   Specimen Description   Final    BLOOD BLOOD RIGHT HAND Performed at [facility], [address] [place], [place] [address]    Special Requests   Final    BOTTLES DRAWN AEROBIC AND ANAEROBIC Blood Culture adequate volume Performed at [facility], [address] [place], [place] [address]    Culture   Final    NO GROWTH 2 DAYS Performed at [facility], [address] [place], [place] [address]    Report Status PENDING  Incomplete         Radiology Studies: DG Abd 1 View  Result Date: 02/04/2020 CLINICAL DATA:  Check gastric catheter placement EXAM: ABDOMEN - 1 VIEW COMPARISON:  01/31/2020 FINDINGS: Previously seen weighted feeding catheter has been withdrawn and now lies within the distal esophagus. This should be advanced several cm. IMPRESSION: Weighted feeding catheter within the distal esophagus. Electronically Signed   By: Alcide CleverMark  Lukens M.D.   On: 02/04/2020 02:24   DG CHEST PORT 1 VIEW  Result Date: 02/04/2020 CLINICAL DATA:  Shortness of breath.  History of COVID pneumonia. EXAM: PORTABLE CHEST 1 VIEW COMPARISON:  01/31/2020. FINDINGS: Interim removal of feeding tube. Heart size stable. Diffuse severe bilateral pulmonary infiltrates are again noted without interim change. No pleural effusion or pneumothorax. IMPRESSION: 1.  Interim removal of feeding tube. 2. Diffuse severe bilateral pulmonary infiltrates are again noted without interim change. Electronically Signed   By: Maisie Fushomas  Register   On: 02/04/2020 09:55        Scheduled Meds: . adenosine      . adenosine      . chlorhexidine  15 mL Mouth Rinse BID  . Chlorhexidine Gluconate Cloth  6 each Topical Daily  . enoxaparin (LOVENOX) injection  60 mg Subcutaneous Q24H  . feeding supplement (PROSource TF)  45 mL Per Tube TID  . free water  200 mL Per  Tube Q4H  . Gerhardt's butt cream   Topical BID  . insulin aspart  0-9 Units Subcutaneous TID WC  . mouth rinse  15 mL Mouth Rinse BID  . methylPREDNISolone (SOLU-MEDROL) injection  20 mg Intravenous Q24H  . metoprolol tartrate  5 mg Intravenous Q6H  . nystatin   Topical BID  . potassium chloride  50 mEq Per Tube Daily  . QUEtiapine  25 mg Per Tube QHS   Continuous Infusions: . sodium chloride Stopped (02/04/20 1550)  . amiodarone 60 mg/hr (02/04/20 1554)   Followed by  . amiodarone    . diltiazem (CARDIZEM) infusion 15 mg/hr (02/04/20 1554)  . feeding supplement (OSMOLITE 1.5 CAL) 35 mL/hr at 02/03/20 1149  . vancomycin 166.7 mL/hr at 02/04/20 1554     LOS: 21 days   The patient is critically ill with multiple organ systems failure and requires high complexity decision making for assessment and support, frequent evaluation and titration of therapies, application of advanced monitoring technologies and extensive interpretation of multiple databases. Critical Care Time devoted to patient care services described in this note  Time spent: 40 minutes     Montel Vanderhoof, Roselind MessierURTIS J, MD [facility] Pager [phone]  If 7PM-7AM, please contact night-coverage [email or web address] Password [email or web address] 02/04/2020, 4:37 PM

## 2020-02-04 NOTE — Progress Notes (Signed)
As ordered by CCM, Amio gtt started at 60 mg/hr with 150 mg bolus over 10 mins. HR remains around 160 bpm, regular rhythm. This RN will continue to closely monitor patient's HR.

## 2020-02-05 ENCOUNTER — Inpatient Hospital Stay (HOSPITAL_COMMUNITY)
Admit: 2020-02-05 | Discharge: 2020-02-05 | Disposition: A | Discharge status: Home / Self Care | Payer: BC Managed Care – PPO | Attending: Internal Medicine | Admitting: Internal Medicine

## 2020-02-05 ENCOUNTER — Inpatient Hospital Stay (HOSPITAL_COMMUNITY): Payer: BC Managed Care – PPO

## 2020-02-05 DIAGNOSIS — D696 Thrombocytopenia, unspecified: Secondary | ICD-10-CM | POA: Diagnosis present

## 2020-02-05 DIAGNOSIS — R4182 Altered mental status, unspecified: Secondary | ICD-10-CM

## 2020-02-05 DIAGNOSIS — R799 Abnormal finding of blood chemistry, unspecified: Secondary | ICD-10-CM | POA: Diagnosis present

## 2020-02-05 DIAGNOSIS — I361 Nonrheumatic tricuspid (valve) insufficiency: Secondary | ICD-10-CM

## 2020-02-05 DIAGNOSIS — D649 Anemia, unspecified: Secondary | ICD-10-CM | POA: Diagnosis present

## 2020-02-05 DIAGNOSIS — E870 Hyperosmolality and hypernatremia: Secondary | ICD-10-CM | POA: Diagnosis present

## 2020-02-05 DIAGNOSIS — R410 Disorientation, unspecified: Secondary | ICD-10-CM | POA: Diagnosis present

## 2020-02-05 DIAGNOSIS — R1310 Dysphagia, unspecified: Secondary | ICD-10-CM

## 2020-02-05 DIAGNOSIS — I4891 Unspecified atrial fibrillation: Secondary | ICD-10-CM

## 2020-02-05 LAB — PROTIME-INR
INR: 1.1 (ref 0.8–1.2)
Prothrombin Time: 14 seconds (ref 11.4–15.2)

## 2020-02-05 LAB — CBC WITH DIFFERENTIAL/PLATELET
Abs Immature Granulocytes: 0.72 10*3/uL — ABNORMAL HIGH (ref 0.00–0.07)
Basophils Absolute: 0 10*3/uL (ref 0.0–0.1)
Basophils Relative: 0 %
Eosinophils Absolute: 0 10*3/uL (ref 0.0–0.5)
Eosinophils Relative: 0 %
HCT: 26.4 % — ABNORMAL LOW (ref 36.0–46.0)
Hemoglobin: 8 g/dL — ABNORMAL LOW (ref 12.0–15.0)
Immature Granulocytes: 5 %
Lymphocytes Relative: 4 %
Lymphs Abs: 0.7 10*3/uL (ref 0.7–4.0)
MCH: 30.5 pg (ref 26.0–34.0)
MCHC: 30.3 g/dL (ref 30.0–36.0)
MCV: 100.8 fL — ABNORMAL HIGH (ref 80.0–100.0)
Monocytes Absolute: 0.9 10*3/uL (ref 0.1–1.0)
Monocytes Relative: 6 %
Neutro Abs: 12.8 10*3/uL — ABNORMAL HIGH (ref 1.7–7.7)
Neutrophils Relative %: 85 %
Platelets: 96 10*3/uL — ABNORMAL LOW (ref 150–400)
RBC: 2.62 MIL/uL — ABNORMAL LOW (ref 3.87–5.11)
RDW: 15.9 % — ABNORMAL HIGH (ref 11.5–15.5)
WBC: 15.2 10*3/uL — ABNORMAL HIGH (ref 4.0–10.5)
nRBC: 0.8 % — ABNORMAL HIGH (ref 0.0–0.2)

## 2020-02-05 LAB — ECHOCARDIOGRAM COMPLETE
Height: 64 in
S' Lateral: 2.1 cm
Weight: 3791.91 oz

## 2020-02-05 LAB — FIBRINOGEN: Fibrinogen: 491 mg/dL — ABNORMAL HIGH (ref 210–475)

## 2020-02-05 LAB — COMPREHENSIVE METABOLIC PANEL
ALT: 66 U/L — ABNORMAL HIGH (ref 0–44)
AST: 37 U/L (ref 15–41)
Albumin: 2.5 g/dL — ABNORMAL LOW (ref 3.5–5.0)
Alkaline Phosphatase: 58 U/L (ref 38–126)
Anion gap: 11 (ref 5–15)
BUN: 52 mg/dL — ABNORMAL HIGH (ref 8–23)
CO2: 23 mmol/L (ref 22–32)
Calcium: 8.5 mg/dL — ABNORMAL LOW (ref 8.9–10.3)
Chloride: 110 mmol/L (ref 98–111)
Creatinine, Ser: 1.03 mg/dL — ABNORMAL HIGH (ref 0.44–1.00)
GFR calc Af Amer: >60 mL/min (ref >60)
GFR calc non Af Amer: 59 mL/min — ABNORMAL LOW (ref >60)
Glucose, Bld: 116 mg/dL — ABNORMAL HIGH (ref 70–99)
Potassium: 3.5 mmol/L (ref 3.5–5.1)
Sodium: 144 mmol/L (ref 135–145)
Total Bilirubin: 0.5 mg/dL (ref 0.3–1.2)
Total Protein: 5.3 g/dL — ABNORMAL LOW (ref 6.5–8.1)

## 2020-02-05 LAB — PLATELET BY CITRATE: Platelet CT in Citrate: UNDETERMINED

## 2020-02-05 LAB — LACTATE DEHYDROGENASE: LDH: 231 U/L — ABNORMAL HIGH (ref 98–192)

## 2020-02-05 LAB — VITAMIN B12: Vitamin B-12: 286 pg/mL (ref 180–914)

## 2020-02-05 LAB — TYPE AND SCREEN
ABO/RH(D): A POS
Antibody Screen: NEGATIVE

## 2020-02-05 LAB — GLUCOSE, CAPILLARY
Glucose-Capillary: 107 mg/dL — ABNORMAL HIGH (ref 70–99)
Glucose-Capillary: 151 mg/dL — ABNORMAL HIGH (ref 70–99)
Glucose-Capillary: 118 mg/dL — ABNORMAL HIGH (ref 70–99)
Glucose-Capillary: 96 mg/dL (ref 70–99)

## 2020-02-05 LAB — PHOSPHORUS: Phosphorus: 4 mg/dL (ref 2.5–4.6)

## 2020-02-05 LAB — D-DIMER, QUANTITATIVE: D-Dimer, Quant: 0.73 ug/mL-FEU — ABNORMAL HIGH (ref 0.00–0.50)

## 2020-02-05 LAB — TECHNOLOGIST SMEAR REVIEW

## 2020-02-05 LAB — APTT: aPTT: <20 seconds — ABNORMAL LOW (ref 24–36)

## 2020-02-05 LAB — TSH: TSH: 1.144 u[IU]/mL (ref 0.350–4.500)

## 2020-02-05 LAB — FERRITIN: Ferritin: 384 ng/mL — ABNORMAL HIGH (ref 11–307)

## 2020-02-05 LAB — C-REACTIVE PROTEIN: CRP: 1.9 mg/dL — ABNORMAL HIGH (ref <1.0)

## 2020-02-05 LAB — AMMONIA: Ammonia: 30 umol/L (ref 9–35)

## 2020-02-05 LAB — PROCALCITONIN: Procalcitonin: 0.23 ng/mL

## 2020-02-05 LAB — MAGNESIUM: Magnesium: 2.1 mg/dL (ref 1.7–2.4)

## 2020-02-05 MED ORDER — LORAZEPAM 2 MG/ML IJ SOLN
2.0000 mg | INTRAMUSCULAR | Status: DC | PRN
  Administered 2020-02-05 – 2020-02-07 (×8): 2 mg via INTRAVENOUS
  Administered 2020-02-07: 4 mg via INTRAVENOUS
  Administered 2020-02-08: 2 mg via INTRAVENOUS
  Administered 2020-02-08: 4 mg via INTRAVENOUS
  Filled 2020-02-05 (×6): qty 1
  Filled 2020-02-05: qty 2
  Filled 2020-02-05: qty 1
  Filled 2020-02-05: qty 2
  Filled 2020-02-05: qty 1
  Filled 2020-02-05: qty 2
  Filled 2020-02-05: qty 1

## 2020-02-05 MED ORDER — ARGATROBAN 50 MG/50ML IV SOLN
0.5000 ug/kg/min | INTRAVENOUS | Status: DC
  Administered 2020-02-05: 0.5 ug/kg/min via INTRAVENOUS
  Filled 2020-02-05: qty 50

## 2020-02-05 MED ORDER — SODIUM CHLORIDE 0.9 % IV SOLN: 250.0000 mL | INTRAVENOUS | Status: DC | PRN

## 2020-02-05 MED ORDER — AMIODARONE HCL IN DEXTROSE 360-4.14 MG/200ML-% IV SOLN
60.0000 mg/h | INTRAVENOUS | Status: DC
  Administered 2020-02-05 (×2): 60 mg/h via INTRAVENOUS
  Filled 2020-02-05 (×2): qty 200

## 2020-02-05 MED ORDER — AMIODARONE HCL IN DEXTROSE 360-4.14 MG/200ML-% IV SOLN
30.0000 mg/h | INTRAVENOUS | Status: DC
  Administered 2020-02-05 – 2020-02-08 (×7): 30 mg/h via INTRAVENOUS
  Filled 2020-02-05 (×6): qty 200

## 2020-02-05 MED ORDER — LORAZEPAM 2 MG/ML IJ SOLN
2.0000 mg | INTRAMUSCULAR | Status: DC | PRN
  Administered 2020-02-05: 2 mg via INTRAVENOUS
  Filled 2020-02-05: qty 1

## 2020-02-05 MED ORDER — METOPROLOL TARTRATE 5 MG/5ML IV SOLN
7.5000 mg | Freq: Four times a day (QID) | INTRAVENOUS | Status: DC
  Administered 2020-02-05 – 2020-02-08 (×12): 7.5 mg via INTRAVENOUS
  Filled 2020-02-05 (×13): qty 10

## 2020-02-05 MED ORDER — SODIUM CHLORIDE 0.9% FLUSH: 3.0000 mL | INTRAVENOUS | Status: DC | PRN

## 2020-02-05 MED ORDER — CYANOCOBALAMIN 1000 MCG/ML IJ SOLN
1000.0000 ug | Freq: Once | INTRAMUSCULAR | Status: AC
  Administered 2020-02-05: 1000 ug via SUBCUTANEOUS
  Filled 2020-02-05: qty 1

## 2020-02-05 MED ORDER — METOPROLOL TARTRATE 5 MG/5ML IV SOLN
5.0000 mg | Freq: Once | INTRAVENOUS | Status: AC
  Administered 2020-02-05: 5 mg via INTRAVENOUS

## 2020-02-05 MED ORDER — HALOPERIDOL LACTATE 5 MG/ML IJ SOLN
2.0000 mg | Freq: Four times a day (QID) | INTRAMUSCULAR | Status: DC | PRN
  Administered 2020-02-05 – 2020-02-07 (×2): 2 mg via INTRAVENOUS
  Filled 2020-02-05 (×4): qty 1

## 2020-02-05 MED ORDER — SODIUM CHLORIDE 0.9% FLUSH
3.0000 mL | Freq: Two times a day (BID) | INTRAVENOUS | Status: DC
  Administered 2020-02-06 – 2020-02-08 (×5): 3 mL via INTRAVENOUS

## 2020-02-05 MED ORDER — AMIODARONE LOAD VIA INFUSION
150.0000 mg | Freq: Once | INTRAVENOUS | Status: AC
  Administered 2020-02-05: 150 mg via INTRAVENOUS
  Filled 2020-02-05: qty 83.34

## 2020-02-05 NOTE — Progress Notes (Signed)
Spoke with RN about when a good time to perform bedside EEG she said this afternoon would be best.

## 2020-02-05 NOTE — Progress Notes (Signed)
Paged Dr. Okey Dupre (on-call Cardiologist with [facility]) concerning pt's HR.  HR continues to stay in the 160's.  Dr. Okey Dupre returned the page and gave verbal order to increase amiodarone to 60mg /hr.

## 2020-02-05 NOTE — Consult Note (Addendum)
Cardiology Consultation:   Patient ID: Julie MarionDeborah M Mcguire; [id number]; Sep 05, 1955   Admit date: 12/25/2019 Date of Consult: 02/05/2020  Primary Care Provider: Elias Elseeade, Robert, MD Primary Cardiologist: [facility] to [facility]; Dr. Jens Somrenshaw Primary Electrophysiologist:  None   Patient Profile:   Julie Mcguire is a 64 y.o. female with a PMH of HTN, morbid obesity, and recent COVID-19 PNA, who is being seen today for the evaluation of atrial fibrillation with RVR at the request of Dr. Allena KatzPatel.  History of Present Illness:   Ms. Julie HowellsWrenn was recently admitted to the hospital from 01/07/20-01/10/20 for COVID-19 PNA where she received remdesivir and baricitinib, and discharged home on a steroid taper and 4L O2 via [place]. Unfortunately she began experiencing SOB, weakness, and cough, prompting her to activated EMS. She was subsequently [facility] 12/29/2019 for acute hypoxic respiratory failure requiring a NRB. Her hospital course has been complicated by encephalopathy, staph epi UTI infection, and dysphagia (high risk for aspiration resulting in NG tube placement for tube feeds).  Patient went for a swallow study 02/04/20 which unfortunately she did not pass; now with plans for NG tube placement with IR after several unsuccessful bedside attempts. Immediately following her procedure she developed tachycardia with rates up to 220s. She was given IV adenosine which slowed the rate enough to reveal flutter waves. She was started on a diltiazem and amiodarone gtt for rate/rhythm control. HR's remained persistently elevated overnight and an amiodarone bolus was recommended by the overnight cardiology fellow. Cardiology asked to evaluate for assistance with atrial fibrillation with RVR management.  At the time of this evaluation, patient remains confused. She repeatedly tells me to "cut them off" in reference to her restraints. She does not participate in history taking despite repeated attempts to  redirect her.    Past Medical History:  Diagnosis Date  . Hypertension     Past Surgical History:  Procedure Laterality Date  . ABDOMINAL HYSTERECTOMY    . APPENDECTOMY    . CHOLECYSTECTOMY    . TONSILLECTOMY    . TOTAL HIP ARTHROPLASTY Left 09/22/2017   Procedure: LEFT TOTAL HIP ARTHROPLASTY ANTERIOR APPROACH;  Surgeon: Kathryne HitchBlackman, Christopher Y, MD;  Location: [facility];  Service: Orthopedics;  Laterality: Left;     Home Medications:  Prior to Admission medications   Medication Sig Start Date End Date Taking? Authorizing Provider  acetaminophen (TYLENOL) 500 MG tablet Take 1,000 mg by mouth every 4 (four) hours as needed for moderate pain or headache.    Yes [provider]  amLODipine (NORVASC) 10 MG tablet Take 10 mg by mouth daily. 01/07/20  Yes [provider]  metoprolol succinate (TOPROL-XL) 100 MG 24 hr tablet Take 100 mg by mouth daily. 08/17/17  Yes [provider]  oxybutynin (DITROPAN) 5 MG tablet Take 5 mg by mouth 2 (two) times daily. 01/06/20  Yes [provider]  valsartan-hydrochlorothiazide (DIOVAN-HCT) 320-25 MG tablet Take 1 tablet by mouth daily. 01/07/20  Yes [provider]  methocarbamol (ROBAXIN) 500 MG tablet Take 1 tablet (500 mg total) by mouth every 6 (six) hours as needed for muscle spasms. Patient not taking: Reported on 01/07/2020 09/23/17   Kathryne HitchBlackman, Christopher Y, MD    Inpatient Medications: Scheduled Meds: . chlorhexidine  15 mL Mouth Rinse BID  . Chlorhexidine Gluconate Cloth  6 each Topical Daily  . enoxaparin (LOVENOX) injection  60 mg Subcutaneous Q24H  . feeding supplement (PROSource TF)  45 mL Per Tube TID  . free  water  200 mL Per Tube Q4H  . Gerhardt's butt cream   Topical BID  . insulin aspart  0-9 Units Subcutaneous TID WC  . mouth rinse  15 mL Mouth Rinse BID  . methylPREDNISolone (SOLU-MEDROL) injection  20 mg Intravenous Q24H  . metoprolol tartrate  5 mg Intravenous Q6H  . nystatin   Topical  BID  . potassium chloride  50 mEq Per Tube Daily  . QUEtiapine  25 mg Per Tube QHS   Continuous Infusions: . sodium chloride Stopped (02/04/20 1550)  . amiodarone    . dexmedetomidine (PRECEDEX) IV infusion    . diltiazem (CARDIZEM) infusion 15 mg/hr (02/04/20 2358)  . feeding supplement (OSMOLITE 1.5 CAL) 35 mL/hr at 02/03/20 1149  . vancomycin Stopped (02/04/20 1729)   PRN Meds: sodium chloride, acetaminophen, albuterol, diclofenac Sodium, guaiFENesin-dextromethorphan, hydrALAZINE, lip balm, LORazepam, morphine injection, ondansetron (ZOFRAN) IV, polyethylene glycol  Allergies:    Allergies  Allergen Reactions  . Codeine Nausea And Vomiting    Social History:   Social History   Socioeconomic History  . Marital status: Single    Spouse name: Not on file  . Number of children: Not on file  . Years of education: Not on file  . Highest education level: Not on file  Occupational History  . Not on file  Tobacco Use  . Smoking status: Former Smoker    Packs/day: 1.00    Years: 15.00    Pack years: 15.00    Quit date: 05/17/1995    Years since quitting: 24.7  . Smokeless tobacco: Never Used  Vaping Use  . Vaping Use: Never used  Substance and Sexual Activity  . Alcohol use: Yes    Comment: occas  . Drug use: Never  . Sexual activity: Yes  Other Topics Concern  . Not on file  Social History Narrative  . Not on file   Social Determinants of Health   Financial Resource Strain:   . Difficulty of Paying Living Expenses: Not on file  Food Insecurity:   . Worried About Programme researcher, broadcasting/film/video in the Last Year: Not on file  . Ran Out of Food in the Last Year: Not on file  Transportation Needs:   . Lack of Transportation (Medical): Not on file  . Lack of Transportation (Non-Medical): Not on file  Physical Activity:   . Days of Exercise per Week: Not on file  . Minutes of Exercise per Session: Not on file  Stress:   . Feeling of Stress : Not on file  Social Connections:     . Frequency of Communication with Friends and Family: Not on file  . Frequency of Social Gatherings with Friends and Family: Not on file  . Attends Religious Services: Not on file  . Active Member of Clubs or Organizations: Not on file  . Attends Banker Meetings: Not on file  . Marital Status: Not on file  Intimate Partner Violence:   . Fear of Current or Ex-Partner: Not on file  . Emotionally Abused: Not on file  . Physically Abused: Not on file  . Sexually Abused: Not on file    Family History:   History reviewed. No pertinent family history.   ROS:  Please see the history of present illness.  ROS  All other ROS reviewed and negative.     Physical Exam/Data:   Vitals:   02/05/20 0530 02/05/20 0607 02/05/20 0730 02/05/20 0754  BP: (!) 114/54  (!) 133/91   Pulse: Marland Kitchen)  117  (!) 122 (!) 169  Resp: (!) 22   (!) 24  Temp:   100.2 F (37.9 C)   TempSrc:      SpO2: (!) 89%  92% (!) 89%  Weight:  107.5 kg    Height:        Intake/Output Summary (Last 24 hours) at 02/05/2020 0817 Last data filed at 02/05/2020 0606 Gross per 24 hour  Intake 604.9 ml  Output 1020 ml  Net -415.1 ml   Filed Weights   02/01/20 0500 02/02/20 0500 02/05/20 0607  Weight: 108.5 kg 108.7 kg 107.5 kg   Body mass index is 40.68 kg/m.  General:  Morbidly obese female sitting upright in bed appearing mildly agitated HEENT: sclera anicteric  Neck: no JVD Vascular: No carotid bruits; distal pulses 2+ bilaterally Cardiac:  normal S1, S2; IRIR; no murmurs, rubs, or gallops Lungs: decreased breath sounds bilaterally without obvious wheezes, rhonchi, or rales Abd: NABS, soft, nontender, no hepatomegaly Ext: no edema Musculoskeletal:  No deformities, BUE restraints in place Skin: warm and dry  Neuro:  CNs 2-12 intact, no focal abnormalities noted Psych:  Confused, would not answer questions, only stating "cut them off"  EKG:  The EKG was personally reviewed and demonstrates:  Atrial  fibrillation with RVR, rate 190 bpm, diffuse ST-T wave abnormalities, no STE. EKG from 02/02/20 also revealed atrial fibrillation with RVR.   Telemetry:  Telemetry was personally reviewed and demonstrates:  Appears to have had some paroxysmal atrial fibrillation with RVR 02/02/20 with intermittent sinus rhythm/sinus tachycardia, then Afib/flutter with RVR starting 02/04/20 which has been persistent with HR up to 180s at times.  Relevant CV Studies: Echocardiogram: pending  Laboratory Data:  Chemistry Recent Labs  Lab 02/03/20 0250 02/04/20 0232 02/05/20 0241  NA 147* 146* 144  K 4.1 4.1 3.5  CL 111 108 110  CO2 27 26 23   GLUCOSE 137* 173* 116*  BUN 34* 57* 52*  CREATININE 0.81 1.05* 1.03*  CALCIUM 8.9 9.3 8.5*  GFRNONAA >60 57* 59*  GFRAA >60 >60 >60  ANIONGAP 9 12 11     Recent Labs  Lab 02/03/20 0250 02/04/20 0232 02/05/20 0241  PROT 5.6* 5.8* 5.3*  ALBUMIN 2.7* 2.8* 2.5*  AST 37 32 37  ALT 57* 60* 66*  ALKPHOS 61 67 58  BILITOT 0.6 0.7 0.5   Hematology Recent Labs  Lab 02/03/20 0250 02/04/20 0232 02/05/20 0241  WBC 16.6* 23.4* 15.2*  RBC 3.06* 2.98* 2.62*  HGB 9.2* 9.2* 8.0*  HCT 30.1* 29.5* 26.4*  MCV 98.4 99.0 100.8*  MCH 30.1 30.9 30.5  MCHC 30.6 31.2 30.3  RDW 15.3 15.5 15.9*  PLT 113* 120* 96*   Cardiac EnzymesNo results for input(s): TROPONINI in the last 168 hours. No results for input(s): TROPIPOC in the last 168 hours.  BNPNo results for input(s): BNP, PROBNP in the last 168 hours.  DDimer  Recent Labs  Lab 02/03/20 0250 02/04/20 0232 02/05/20 0241  DDIMER 0.87* 1.28* 0.73*    Radiology/Studies:  DG Abd 1 View  Result Date: 02/04/2020 CLINICAL DATA:  Check gastric catheter placement EXAM: ABDOMEN - 1 VIEW COMPARISON:  01/31/2020 FINDINGS: Previously seen weighted feeding catheter has been withdrawn and now lies within the distal esophagus. This should be advanced several cm. IMPRESSION: Weighted feeding catheter within the distal esophagus.  Electronically Signed   By: 02/06/2020 M.D.   On: 02/04/2020 02:24   DG CHEST PORT 1 VIEW  Result Date: 02/04/2020 CLINICAL DATA:  Shortness  of breath.  History of COVID pneumonia. EXAM: PORTABLE CHEST 1 VIEW COMPARISON:  01/31/2020. FINDINGS: Interim removal of feeding tube. Heart size stable. Diffuse severe bilateral pulmonary infiltrates are again noted without interim change. No pleural effusion or pneumothorax. IMPRESSION: 1.  Interim removal of feeding tube. 2. Diffuse severe bilateral pulmonary infiltrates are again noted without interim change. Electronically Signed   By: Maisie Fus  Register   On: 02/04/2020 09:55    Assessment and Plan:   1. New onset atrial fibrillation/flutter with RVR: appears her first episode of atrial fibrillation occurred on 02/02/20 as noted by her EKG. Upon returning from a swallow study 02/04/20, she was found to be tachycardic to the 200s. Adenosine administered and flutter waves were seen. She was started on a diltiazem and amiodarone gtt for rate/rhythm control, though she remains tachycardic to the 160s. Suspect this is driven by her underlying infection (COVID-19 PNA and UTI, as well as possible concern for aspiration during her swallow study).  - Will discuss options with Dr. Jens Som for DCCV +/- TEE - unlikely to be a candidate for TEE at this time given O2 demands and dysphagia concerns.  - Continue diltiazem gtt and IV metoprolol for rate control - Continue amiodarone gtt for rhythm control - will given an additional amiodarone bolus at this time - Could consider addition of digoxin  - This patients CHA2DS2-VASc Score and unadjusted Ischemic Stroke Rate (% per year) is equal to at least 3.2 % stroke rate/year from a score of 3 Above score calculated as 1 point each if present [CHF, HTN, DM, Vascular=MI/PAD/Aortic Plaque, Age if 65-74, or Female] Above score calculated as 2 points each if present [Age > 75, or Stroke/TIA/TE] - She would benefit from  anticoagulation - would consider eliquis 5mg  BID once clear no further procedures are necessary.  - Favor use of heparin gtt in the meantime for stroke ppx - Will follow-up echocardiogram results  2. COVID-19 PNA: s/p remdesivir and baricitinib. Now on IV steroids - tapering off. She continues to have hypoxic respiratory failure requiring O2 via HFNC/NRB - Continue management per primary team/PCCM  3. HTN: home valsartan-HCTZ, amlodipine, and metoprolol succinate have been on hold this admission.  - Managed in the context of #1  4. Toxic metabolic encephalopathy: likely 2/2 sedating medication and prolonged critical illness. Improving off precedex.  - Continue management per primary team  5. Dysphagia: Patient pulled previously placed NG tube place by IR overnight 02/04/20 in a state of confusion/agitation. Again failed swallow study 02/04/20. Plan for IR placed NG tube (failed bedside attempts) once HR's improved - Continue management per primary team  6. Anemia: Steady decline in Hgb this admission - 12-13 on admission, down to 9.2 yesterday, and 8.0 today. No documentation of bleeding - Defer management to primary team, though will need to ensure no active bleeding prior to initiating anticoagulation for #1.    For questions or updates, please contact [facility] Please consult [email or web address] for contact info under Cardiology/STEMI.   Signed, 02/06/20, PA-C  02/05/2020 8:17 AM [phone] As above, patient seen and examined.  Briefly she is a 64 year old female with past medical history of hypertension, morbid obesity and recent Covid infection for evaluation of atrial fibrillation/atrial flutter.  Patient recently hospitalized for Covid pneumonia late August.  She was discharged home but developed dyspnea, weakness, cough and encephalopathy.  Patient had swallow study on September 21 and following procedure developed atrial fibrillation with rapid ventricular response.  Her  rate has  been difficult to control and cardiology asked to evaluate.  At time of evaluation patient is encephalopathic and does not respond appropriately to questions.  Electrocardiogram shows atrial fibrillation with rapid ventricular response.  Review of telemetry shows probable atrial flutter.  Her heart rate is now in the 80s and 90s.  1 atrial fibrillation/flutter-patient's heart rate has been difficult to control.  However today it is in the 80s and 90s.  We will continue with amiodarone, Cardizem and metoprolol at present dose.  Can give additional metoprolol if blood pressure allows and if necessary.  I have briefly reviewed patient's echocardiogram and LV function appears to be normal.  Note TSH is also normal. CHADSvasc 3.  Would initiate heparin and later apixaban once mental status improves.  When she recovers from present illness could plan to proceed with elective cardioversion after 3 full weeks of anticoagulation if atrial arrhythmias persist.  2 respiratory failure-critical care medicine managing.  3 acute metabolic encephalopathy  Olga Millers, MD

## 2020-02-05 NOTE — Progress Notes (Signed)
Patient persistently tachycardic to 160s following increase of amiodarone gtt to 60mg /hr this morning and administrating of a total of 10mg  of metoprolol, 4mg  of ativan, 1mg  of morphine, and 2mg  of haldol. Cardiology notified, advised to continue to monitor.

## 2020-02-05 NOTE — Progress Notes (Signed)
°  Echocardiogram 2D Echocardiogram has been performed.  Julie Mcguire 02/05/2020, 11:38 AM

## 2020-02-05 NOTE — Procedures (Signed)
Patient Name: Julie Mcguire  MRN: [id number]  Epilepsy Attending: Charlsie Quest  Referring Physician/Provider: Dr Lynden Oxford Date: 02/05/2020 Duration: 25.14 mins  Patient history: 63yo F with ams. EEG to evaluate for seizure.  Level of alertness: Awake  AEDs during EEG study: None  Technical aspects: This EEG study was done with scalp electrodes positioned according to the 10-20 International system of electrode placement. Electrical activity was acquired at a sampling rate of 500Hz  and reviewed with a high frequency filter of 70Hz  and a low frequency filter of 1Hz . EEG data were recorded continuously and digitally stored.   Description: No posterior dominant rhythm was seen. EEG showed continuous generalized 3 to 6 Hz theta-delta slowing. Hyperventilation and photic stimulation were not performed.     ABNORMALITY -Continuous slow, generalized  IMPRESSION: This study is suggestive of moderate diffuse encephalopathy, nonspecific etiology. No seizures or epileptiform discharges were seen throughout the recording.   [place] 

## 2020-02-05 NOTE — Progress Notes (Signed)
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 Codeine Nausea And Vomiting  . Heparin     Per HIT testing protocol    Patient Measurements: Height: 5\' 4"  (162.6 cm) Weight: 107.5 kg (236 lb 15.9 oz) IBW/kg (Calculated) : 54.7  Vital Signs: Temp: 99.9 F (37.7 C) (09/22 1800) Temp Source: Core (09/22 1600) BP: 133/43 (09/22 1800) Pulse Rate: 88 (09/22 1800)  Labs: Recent Labs    02/03/20 0250 02/03/20 0250 02/04/20 0232 02/05/20 0241 02/05/20 0852  HGB 9.2*   < > 9.2* 8.0*  --   HCT 30.1*  --  29.5* 26.4*  --   PLT 113*  --  120* 96*  --   LABPROT  --   --   --   --  14.0  INR  --   --   --   --  1.1  CREATININE 0.81  --  1.05* 1.03*  --    < > = values in this interval not displayed.    Estimated Creatinine Clearance: 68.6 mL/min (A) (by C-G formula based on SCr of 1.03 mg/dL (H)).   Medical History: Past Medical History:  Diagnosis Date  . Hypertension    Assessment: 64 y/o F admitted with worsening ShOB after previously being hospitalized for COVID-PNA. Hospital course has been prolonged and complicated by encephalopathy, UTI, dysphagia, and AFIB with RVR. Platelets along with hemoglobin have been progressively declining with PLT decreased significantly in the last several days. Patient seems to be outside of the window for HIT but after discussing with Dr. 44, other causes for severe thrombocytopenia seem less likely. 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 Plan is to proceed to order HIT panel and proceed with argatroban.   4T score= 4  Goal of Therapy:  APTT goal 50-90 Monitor platelets by anticoagulation protocol: Yes   Plan:  Initiate argatroban at 0.5 mcg/kg/min - Check aPTT 2 hours after initiating  - aPTT q 2 hours until 2 therapeutic levels then daily - daily CBC   Allena Katz D 02/05/2020,7:13 PM

## 2020-02-05 NOTE — Progress Notes (Signed)
PT Cancellation Note  Patient Details Name: Julie Mcguire MRN: [id number] DOB: Feb 15, 1956   Cancelled Treatment:    Reason Eval/Treat Not Completed: Medical issues which prohibited therapy.   Cardiology consulted today d/t atrial fibrillation with RVR. Pt with resting HR up to 168 this am. Defer PT at this time   [facility] 02/05/2020, 10:39 AM

## 2020-02-05 NOTE — Progress Notes (Signed)
Pt's HR remains in the 160's.  On-call cardiology was paged to address increased HR.  Page was not returned.

## 2020-02-05 NOTE — Progress Notes (Signed)
EEG complete - results pending 

## 2020-02-05 NOTE — Progress Notes (Signed)
Pt became very agitated and combative.  Pt removed HHNC and NRB.  Pt refused to put them back on and also removed pulse oximeter.  Pt stated she was "tired of being here and ready to go home".  Several staff members tried to calm pt with no success.  Pt's husband was called and he spoke to his wife trying to convince her she needed to remain in the hospital.  Pt's husband stated he does not want his wife discharged until she is stable and ready to come home.  Pt received ativan and bilateral wrists restraints were applied.  Husband was in agreement to the restraints.

## 2020-02-05 NOTE — Progress Notes (Signed)
NAME:  Julie Mcguire, MRN:  [id number], DOB:  06/19/1955, LOS: 22 ADMISSION DATE:  12/24/2019, CONSULTATION DATE:  01/28/20 REFERRING MD:  Dr. Sharon Seller, CHIEF COMPLAINT:  SOB  Brief History   64 y/o F admitted with acute hypoxemic respiratory failure in the setting of COVID PNA    Past Medical History  HTN Obesity   Significant Hospital Events   8/24-8/27 - Admit with COVID 8/31- Admit with worsening shortness of breath 9/14 - PCCM consulted for evaluation of encephalopathy and hypoxic respiratory failure 9/15 - Precedex started 9/16 - HHFNC + NRB intermittent , precedex  9/19 - precedex off 9/21 - new AF RVR, diltiazem gtt /amio gtt started, cardiology consulted  Consults:  Cardiology  Procedures:     Significant Diagnostic Tests:   CTA Chest 9/2 >> negative for acute PE to the segmental level, findings consistent with known COVID infection and superimposed pulmonary edema   Venous Duplex BLE 9/2 >> negative bilaterally   Micro Data:  COVID 8/24 >> positive Strep Antigen 9/6 >> negative  U. Legionella 9/6 >> negative  UC 9/18 >> positive Staph Epi >> S- Vancomycin, Nitrofurantoin, Tetracycline  Antimicrobials:  Ceftriaxone 9/6 >> 9/8  Azithromycin 9/6 >> 9/8  Vancomycin 9/20 >> Interim history/subjective:   Pt remains in Afib RVR, amio drip titrated back to 60 mg O/N per cardiology on call.  Per RN Ativan PRN brought rate down for a couple hours. O2 weaned this AM bedside to [facility] 85%/ 35 L without NRB. Pt minimally interactive.  Objective   Blood pressure (!) 114/54, pulse (!) 117, temperature 99.6 F (37.6 C), temperature source Axillary, resp. rate (!) 22, height 5\' 4"  (1.626 m), weight 107.5 kg, SpO2 (!) 89 %.    FiO2 (%):  [60 %-100 %] 100 %   Intake/Output Summary (Last 24 hours) at 02/05/2020 02/07/2020 Last data filed at 02/05/2020 0606 Gross per 24 hour  Intake 604.9 ml  Output 1020 ml  Net -415.1 ml   Filed Weights   02/01/20 0500 02/02/20 0500  02/05/20 02/07/20  Weight: 108.5 kg 108.7 kg 107.5 kg   Examination:  General: Pt lying in bed, lethargic/withdrawn, responding to questions HEENT: MM pink/ moist, anicteric Neuro: Alert/disoriented to time only, follows simple commands, MAE, PERRL CV: s1s2, irregular, AF RVR on monitor, pulses +2, no murmurs/rubs/gallops Pulm: regular/unlabored, HHFNC 85%/35L, breath sounds coarse/diminished GI: soft, non tender, bowel sounds active Skin: limited exam, known MSAD Extremities: warm/dry, trace dependent edema  Labs/Imaging reviewed ALT trending up- 66 TMAX: 37.9 / WBC: 15.2 CXR 9/21 showed unchanged diffuse bilateral pulmonary infiltrates  Resolved Hospital Problem list     Hypernatremia   Assessment & Plan:   Acute Hypoxic Respiratory Failure in the setting of COVID PNA / ARDS Treated with baricitinib, remdesivir & steroids during first admit. Initially treated with IV antibiotics x 3 days, PCT not convincing for acute bacterial component. Currently receiving steroids.  - Continue HHFNC with NRB PRN, wean O2 as tolerated - Intermittent CXR - Aggressive Pulmonary Hygiene as tolerated: IS 10 x Q 1 h while awake & flutter valve PRN - Consider weaning Solu-medrol, as pt has received adequate therapy  Atrial Fibrillation w/ RVR New this admission, unclear if this is new onset or undiagnosed chronic issue S/p Adenosine given 6 mg x 2,10 mg dilt. IVP, Amio bolus, Diltiazem drip & Amio drip started 9/21  - Cardiology consulted 9/21, appreciate input - Diltiazem & Amio drips continued- O/N cardiology on call gave order to keep  Amio drip at 60 mg as pt was sustaining AF RVR - Considering cardioversion pending cardiology consultation to determine need for TEE prior - continue lopressor  5 mg Q 6 IVP since pt remains NPO - continuous cardiac monitoring  Staph Epi UTI Low grade fever, left-shift on CBC and signs of pain while voiding UC + for > 100,000 colonies of Staph Epi. Vancomycin 3  day course & pyridium 1 dose given.  - Vancomycin continued per Rx consult, 3 day treatment course (9/20-9/23) - Mental status appears slightly improved since abx therapy initiated - monitor fever curve, CBC & mental status  Acute Metabolic Encephalopathy- resolving Suspect multifactorial in the setting of hypoxia, steroids & medications Mental status has improved slightly since initiation of abx therapy for UTI  - monitor mental status  - Avoid Benzos, off precedex - continue Seroquel at night  Transaminitis  - per primary team   Elevated D-Dimer in the setting of COVID - resolving Venous duplex- negative for DVT, CTa chest- negative for PE  - monitor d- dimer, if persistent elevation with fever consider repeat venous duplex  Steroid Induced Hyperglycemia  - Q 4 CBG with SSI - consider discontinuing steroid therapy due to adequate duration  At Risk Malnutrition  - per primary  - Dietary/ SLP consult in place - TF on hold until PO access re-obtained  Best practice:  Diet: NPO Pain/Anxiety/Delirium protocol (if indicated): continued VAP protocol (if indicated): N/A DVT prophylaxis: Lovenox- per primary team GI prophylaxis: per primary Glucose control: SSI, range 140-180, Q 4 CBG Mobility: as tolerated, PT following Code Status: FULL Family Communication: updated per primary team Disposition: SDU  Critical care time: NA   Cheryll Cockayne Rust-Chester, AGACNP-BC [facility]    Please see Amion for pager details.

## 2020-02-05 NOTE — TOC Progression Note (Signed)
Transition of Care [facility]) - Progression Note    Patient Details  Name: JAMIKA SADEK MRN: [id number] Date of Birth: 1955-11-02  Transition of Care [facility] CM/SW Contact  Golda Acre, RN Phone Number: 02/05/2020, 8:24 AM  Clinical Narrative:    Temp-100.2, hfnrb-35l/min, iv amiodarone,iv precedex, iv caridzem, tube feeds, iv vancoready Iv solu medrol ,wbc 15.2, hgb 8.0, platelets=96, bun52 creat 1.03 Remains in af with rvr, requires hfnc. Plan following for progression, return to home/may need snf placment once stable.  Expected Discharge Plan: Home/Self Care Barriers to Discharge: Continued Medical Work up  Expected Discharge Plan and Services Expected Discharge Plan: Home/Self Care   Discharge Planning Services: CM Consult   Living arrangements for the past 2 months: Single Family Home                                       Social Determinants of Health (SDOH) Interventions    Readmission Risk Interventions No flowsheet data found.

## 2020-02-05 NOTE — Progress Notes (Signed)
[facility] Progress Note  Patient: Julie Mcguire    EXH:[id number]  DOA: 12/26/2019     Date of Service: the patient was seen and examined on 02/05/2020  Brief hospital course: Past medical history of hypertension and obesity presents with worsening shortness of breath and hypoxia after recently being hospitalized and discharged for COVID-19 pneumonia.  Hospital course complicated by ongoing encephalopathy, staph epi UTI infection, dysphagia and A. fib with RVR. Currently plan is continue current treatment for RVR, sepsis as well as encephalopathy.  Assessment and Plan: 1.  COVID-19 pneumonia Acute hypoxic respiratory failure, POA Sepsis POA secondary to Covid pneumonia Acute metabolic encephalopathy secondary to staph epi UTI hospital induced delirium Completed Solu-Medrol course along with baricitinib and remdesivir. Continue to remain hypoxic for now. CT angio chest negative for any acute pulmonary embolism. Last chest x-ray on 02/04/2020 shows diffuse severe bilateral pulmonary infiltrate. Work-up for secondary infection with blood cultures on 02/02/2020 so far negative. Urine culture on 02/01/2020 + for Staph epidermidis. Low-grade temperature on 02/05/2020. Started on IV vancomycin for staph epi on 02/03/2020 for 3 days. CRP improving. Continue monitoring closely may require repeat culture and broad-spectrum antibiotic if remains febrile. We will also need ID consultation.  2.  Acute metabolic encephalopathy Presentation with delirium. Require Precedex. Off of Precedex remains agitated requiring restraint for now. Patient was on Seroquel via NG tube. NG tube was removed and therefore patient was started on IV Ativan. IV Haldol on 02/05/2020 was introduced although per RN patient had paradoxical effect with agitation. Currently continuing Ativan and Haldol as needed. EEG negative for any acute seizures. Ammonia, TSH level normal.  Mildly low B12 level supplemented with  subcutaneous injection.  No focal deficit on exam. CT head on 01/27/2020 - for any acute abnormality. At risk for poor outcome and need for intubation for airway protection if remains agitated.  3.  A. fib with RVR, new onset Cardiology consulted. Adenosine was given which slowed down the heart rate and atrial flutter was evident. Started on amiodarone drip on 02/04/2020. Along with Cardizem and IV metoprolol. Currently cardiology considering addition of digoxin. Cardiology recommending therapeutic heparin although in the setting of thrombocytopenia this can potentially lead to bleeding. Monitor for now.  4.  Dysphagia In the setting of acute encephalopathy. Anticipating improvement in swallowing ability once her encephalopathy as well as respiratory distress improves. Failed bedside swallowing evaluation. Bedside NG tube placement failed. IR placement of NG tube was performed, tube feedings were started although unfortunately NG tube was pulled out by patient. Currently awaiting cardiac stability before placement of new NG tube by IR.  5.  Hypernatremia, currently resolved. In the setting of poor p.o. intake. Corrected with free water. Monitor.  6.  Elevated BUN In the setting of steroid use. Monitor for now.  7.  Steroid-induced leukocytosis Monitor for now.  8.  Acute thrombocytopenia Macrocytic anemia No active bleeding reported by RN. Platelet count significantly reducing.  Currently 96. Citrated platelet unable to be measured due to clumping. RBC morphology shows no evidence of schistocytes. INR normal. Fibrinogen level elevated. Haptoglobin currently pending. White clumping noted on the specimen collected on 02/05/2020 no clumping has been reported on prior specimens for last 4 days. Platelet at peak 300. We will initiate HIT work-up.  9.  Obesity, morbid Body mass index is 40.68 kg/m.  Currently unable to take anything p.o. We will discuss dietary consultation  once able to follow commands. Currently n.p.o.  Diet: NPO DVT Prophylaxis: Therapeutic Anticoagulation  with direct thrombin inhibitor.   Advance goals of care discussion: Full code  Family Communication: no family was present at bedside, at the time of interview.  Discussed with husband on the phone, Opportunity was given to ask question and all questions were answered satisfactorily.   Disposition:  Status is: Inpatient  Remains inpatient appropriate because:Hemodynamically unstable and Inpatient level of care appropriate due to severity of illness   Dispo:  Patient From:   Home  Planned Disposition:   To be determined  Expected discharge date: Not clear  Medically stable for discharge:   No   Subjective: Patient continues to repeat only 1 line, cut it out.  Unable to follow commands.  Heart rate remains elevated no other acute events.  Physical Exam:  General: Appear in marked distress, no Rash; Oral Mucosa Clear, moist. no Abnormal Neck Mass Or lumps, Conjunctiva normal  Cardiovascular: S1 and S2 Present, no Murmur, Respiratory: increased respiratory effort, Bilateral Air entry present and bilateral  Crackles, Occasional  wheezes Abdomen: Bowel Sound present, Soft and difficult to assess tenderness Extremities: trace Pedal edema Neurology: lethargic and not oriented to time, place, and person affect emotionally labile. no new focal deficit Gait not checked due to patient safety concerns  Vitals:   2020/03/03 1346 2020-03-03 1400 Mar 03, 2020 1500 03/03/20 1600  BP:  124/73 (!) 138/94   Pulse:  88 (!) 150   Resp:      Temp:  99.7 F (37.6 C) 99.5 F (37.5 C) 99.5 F (37.5 C)  TempSrc:    Core  SpO2: 92% 96% 94%   Weight:      Height:        Intake/Output Summary (Last 24 hours) at 03-Mar-2020 1746 Last data filed at Mar 03, 2020 0900 Gross per 24 hour  Intake 1093.74 ml  Output 600 ml  Net 493.74 ml   Filed Weights   02/01/20 0500 02/02/20 0500 2020/03/03 0607  Weight:  108.5 kg 108.7 kg 107.5 kg    Data Reviewed: I have personally reviewed and interpreted daily labs, tele strips, imagings as discussed above. I reviewed all nursing notes, pharmacy notes, vitals, pertinent old records I have discussed plan of care as described above with RN and patient/family.  CBC: Recent Labs  Lab 02/01/20 0304 02/02/20 0043 02/03/20 0250 02/04/20 0232 2020/03/03 0241  WBC 20.2* 22.4* 16.6* 23.4* 15.2*  NEUTROABS 16.2* 18.4* 13.7* 19.7* 12.8*  HGB 9.9* 10.5* 9.2* 9.2* 8.0*  HCT 31.3* 32.6* 30.1* 29.5* 26.4*  MCV 94.8 95.3 98.4 99.0 100.8*  PLT 117* 128* 113* 120* 96*   Basic Metabolic Panel: Recent Labs  Lab 02/01/20 0304 02/02/20 0043 02/03/20 0250 02/04/20 0232 03/03/2020 0241  NA 142 144 147* 146* 144  K 3.6 3.7 4.1 4.1 3.5  CL 104 107 111 108 110  CO2 28 27 27 26 23   GLUCOSE 136* 102* 137* 173* 116*  BUN 35* 37* 34* 57* 52*  CREATININE 0.99 0.94 0.81 1.05* 1.03*  CALCIUM 8.5* 8.6* 8.9 9.3 8.5*  MG 2.3 2.0 2.0 2.1 2.1  PHOS 4.1 3.3 3.5 3.9 4.0    Studies: EEG adult  Result Date: Mar 03, 2020 02/07/2020, MD     03-03-20  2:41 PM Patient Name: Julie Mcguire MRN: Alain [facility] Epilepsy Attending: [id number] Referring Physician/Provider: Dr Charlsie Quest Date: 03/03/20 Duration: 25.14 mins Patient history: 64yo F with ams. EEG to evaluate for seizure. Level of alertness: Awake AEDs during EEG study: None Technical aspects: This EEG study was done with  scalp electrodes positioned according to the 10-20 International system of electrode placement. Electrical activity was acquired at a sampling rate of 500Hz  and reviewed with a high frequency filter of 70Hz  and a low frequency filter of 1Hz . EEG data were recorded continuously and digitally stored. Description: No posterior dominant rhythm was seen. EEG showed continuous generalized 3 to 6 Hz theta-delta slowing. Hyperventilation and photic stimulation were not performed.   ABNORMALITY -Continuous  slow, generalized IMPRESSION: This study is suggestive of moderate diffuse encephalopathy, nonspecific etiology. No seizures or epileptiform discharges were seen throughout the recording.   ECHOCARDIOGRAM COMPLETE  Result Date: 02/05/2020    ECHOCARDIOGRAM REPORT   Patient Name:   Julie Mcguire [facility] Date of Exam: 02/05/2020 Medical Rec #:  Etta Grandchild       Height:       64.0 in Accession #:    [facility]      Weight:       237.0 lb Date of Birth:  29-Aug-1955      BSA:          2.103 m Patient Age:    63 years        BP:           133/91 mmHg Patient Gender: F               HR:           157 bpm. Exam Location:  Inpatient Procedure: 2D Echo, Cardiac Doppler and Color Doppler                                MODIFIED REPORT:   This report was modified by [id number] MD on 02/05/2020 due to Change                                     TAPSE.  Indications:     Atrial Fibrillation 427.31 / I48.91  History:         Patient has no prior history of Echocardiogram examinations.                  Risk Factors:Hypertension and Former Smoker.  Sonographer:     04/15/1956 RDCS Referring Phys:  Laurance Flatten [facility] M Karas Pickerill Diagnosing Phys: Renella Cunas MD IMPRESSIONS  1. Left ventricular ejection fraction, by estimation, is 60 to 65%. The left ventricle has normal function. The left ventricle has no regional wall motion abnormalities. Indeterminate diastolic filling due to E-A fusion.  2. Right ventricular systolic function is normal. The right ventricular size is normal.  3. The mitral valve is normal in structure. Trivial mitral valve regurgitation. No evidence of mitral stenosis.  4. The aortic valve is tricuspid. There is mild thickening of the aortic valve. Aortic valve regurgitation is not visualized. Mild aortic valve sclerosis is present, with no evidence of aortic valve stenosis.  5. The inferior vena cava is normal in size with greater than 50% respiratory variability, suggesting right atrial pressure  of 3 mmHg. FINDINGS  Left Ventricle: Left ventricular ejection fraction, by estimation, is 60 to 65%. The left ventricle has normal function. The left ventricle has no regional wall motion abnormalities. The left ventricular internal cavity size was normal in size. There is  no left ventricular hypertrophy. Indeterminate diastolic filling due to E-A fusion. Right Ventricle: The right ventricular size is  normal. No increase in right ventricular wall thickness. Right ventricular systolic function is normal. Left Atrium: Left atrial size was normal in size. Right Atrium: Right atrial size was normal in size. Pericardium: There is no evidence of pericardial effusion. Mitral Valve: The mitral valve is normal in structure. There is mild thickening of the mitral valve leaflet(s). Trivial mitral valve regurgitation. No evidence of mitral valve stenosis. Tricuspid Valve: Peak PASP 34mmHg +RAP. The tricuspid valve is normal in structure. Tricuspid valve regurgitation is mild. Aortic Valve: The aortic valve is tricuspid. There is mild thickening of the aortic valve. There is mild aortic valve annular calcification. Aortic valve regurgitation is not visualized. Mild aortic valve sclerosis is present, with no evidence of aortic valve stenosis. Pulmonic Valve: The pulmonic valve was normal in structure. Pulmonic valve regurgitation is not visualized. Aorta: The aortic root is normal in size and structure. Venous: The inferior vena cava is normal in size with greater than 50% respiratory variability, suggesting right atrial pressure of 3 mmHg. IAS/Shunts: The atrial septum is grossly normal.  LEFT VENTRICLE PLAX 2D LVIDd:         3.65 cm LVIDs:         2.10 cm LV PW:         0.90 cm LV IVS:        0.90 cm LVOT diam:     2.00 cm LV SV:         45 LV SV Index:   21 LVOT Area:     3.14 cm  LEFT ATRIUM             Index       RIGHT ATRIUM           Index LA Vol (A2C):   44.3 ml 21.07 ml/m RA Area:     11.90 cm LA Vol (A4C):   31.1  ml 14.79 ml/m RA Volume:   27.90 ml  13.27 ml/m LA Biplane Vol: 37.8 ml 17.98 ml/m  AORTIC VALVE LVOT Vmax:   112.00 cm/s LVOT Vmean:  79.200 cm/s LVOT VTI:    0.142 m  AORTA Ao Root diam: 2.90 cm  SHUNTS Systemic VTI:  0.14 m Systemic Diam: 2.00 cm Laurance FlattenHeather Pemberton MD Electronically signed by Laurance FlattenHeather Pemberton MD Signature Date/Time: 02/05/2020/12:02:16 PM    Final (Updated)     Scheduled Meds:  amiodarone  150 mg Intravenous Once   chlorhexidine  15 mL Mouth Rinse BID   Chlorhexidine Gluconate Cloth  6 each Topical Daily   enoxaparin (LOVENOX) injection  60 mg Subcutaneous Q24H   Gerhardt's butt cream   Topical BID   mouth rinse  15 mL Mouth Rinse BID   methylPREDNISolone (SOLU-MEDROL) injection  20 mg Intravenous Q24H   metoprolol tartrate  7.5 mg Intravenous Q6H   nystatin   Topical BID   Continuous Infusions:  sodium chloride 10 mL/hr at 02/05/20 0900   amiodarone 30 mg/hr (02/05/20 1530)   diltiazem (CARDIZEM) infusion 15 mg/hr (02/05/20 1557)   PRN Meds: sodium chloride, acetaminophen, albuterol, diclofenac Sodium, haloperidol lactate, lip balm, LORazepam, morphine injection, ondansetron (ZOFRAN) IV  Time spent: 35 minutes  Author: Lynden OxfordPranav Zaedyn Covin, MD [facility] 02/05/2020 5:46 PM  To reach On-call, see care teams to locate the attending and reach out via [email or web address]. Between 7PM-7AM, please contact night-coverage If you still have difficulty reaching the attending provider, please page the [facility] (Director on Call) for [facility] on amion for assistance.

## 2020-02-06 ENCOUNTER — Encounter (HOSPITAL_COMMUNITY): Payer: Self-pay | Admitting: Internal Medicine

## 2020-02-06 ENCOUNTER — Inpatient Hospital Stay (HOSPITAL_COMMUNITY): Payer: BC Managed Care – PPO

## 2020-02-06 DIAGNOSIS — I4891 Unspecified atrial fibrillation: Secondary | ICD-10-CM

## 2020-02-06 DIAGNOSIS — G9340 Encephalopathy, unspecified: Secondary | ICD-10-CM

## 2020-02-06 LAB — COMPREHENSIVE METABOLIC PANEL
ALT: 68 U/L — ABNORMAL HIGH (ref 0–44)
AST: 36 U/L (ref 15–41)
Albumin: 2.5 g/dL — ABNORMAL LOW (ref 3.5–5.0)
Alkaline Phosphatase: 60 U/L (ref 38–126)
Anion gap: 10 (ref 5–15)
BUN: 36 mg/dL — ABNORMAL HIGH (ref 8–23)
CO2: 26 mmol/L (ref 22–32)
Calcium: 8.7 mg/dL — ABNORMAL LOW (ref 8.9–10.3)
Chloride: 110 mmol/L (ref 98–111)
Creatinine, Ser: 0.97 mg/dL (ref 0.44–1.00)
GFR calc Af Amer: >60 mL/min (ref >60)
GFR calc non Af Amer: >60 mL/min (ref >60)
Glucose, Bld: 103 mg/dL — ABNORMAL HIGH (ref 70–99)
Potassium: 3.5 mmol/L (ref 3.5–5.1)
Sodium: 146 mmol/L — ABNORMAL HIGH (ref 135–145)
Total Bilirubin: 0.6 mg/dL (ref 0.3–1.2)
Total Protein: 5.5 g/dL — ABNORMAL LOW (ref 6.5–8.1)

## 2020-02-06 LAB — CBC WITH DIFFERENTIAL/PLATELET
Abs Immature Granulocytes: 0.78 10*3/uL — ABNORMAL HIGH (ref 0.00–0.07)
Basophils Absolute: 0 10*3/uL (ref 0.0–0.1)
Basophils Relative: 0 %
Eosinophils Absolute: 0.1 10*3/uL (ref 0.0–0.5)
Eosinophils Relative: 0 %
HCT: 25.8 % — ABNORMAL LOW (ref 36.0–46.0)
Hemoglobin: 8.1 g/dL — ABNORMAL LOW (ref 12.0–15.0)
Immature Granulocytes: 5 %
Lymphocytes Relative: 6 %
Lymphs Abs: 0.9 10*3/uL (ref 0.7–4.0)
MCH: 30.8 pg (ref 26.0–34.0)
MCHC: 31.4 g/dL (ref 30.0–36.0)
MCV: 98.1 fL (ref 80.0–100.0)
Monocytes Absolute: 0.6 10*3/uL (ref 0.1–1.0)
Monocytes Relative: 4 %
Neutro Abs: 13.1 10*3/uL — ABNORMAL HIGH (ref 1.7–7.7)
Neutrophils Relative %: 85 %
Platelets: 71 10*3/uL — ABNORMAL LOW (ref 150–400)
RBC: 2.63 MIL/uL — ABNORMAL LOW (ref 3.87–5.11)
RDW: 16.1 % — ABNORMAL HIGH (ref 11.5–15.5)
WBC: 15.5 10*3/uL — ABNORMAL HIGH (ref 4.0–10.5)
nRBC: 1 % — ABNORMAL HIGH (ref 0.0–0.2)

## 2020-02-06 LAB — GLUCOSE, CAPILLARY
Glucose-Capillary: 102 mg/dL — ABNORMAL HIGH (ref 70–99)
Glucose-Capillary: 146 mg/dL — ABNORMAL HIGH (ref 70–99)
Glucose-Capillary: 133 mg/dL — ABNORMAL HIGH (ref 70–99)
Glucose-Capillary: 109 mg/dL — ABNORMAL HIGH (ref 70–99)
Glucose-Capillary: 103 mg/dL — ABNORMAL HIGH (ref 70–99)

## 2020-02-06 LAB — APTT
aPTT: 20 seconds — ABNORMAL LOW (ref 24–36)
aPTT: 25 seconds (ref 24–36)
aPTT: 34 seconds (ref 24–36)
aPTT: 37 seconds — ABNORMAL HIGH (ref 24–36)
aPTT: 43 seconds — ABNORMAL HIGH (ref 24–36)

## 2020-02-06 LAB — D-DIMER, QUANTITATIVE: D-Dimer, Quant: 0.88 ug/mL-FEU — ABNORMAL HIGH (ref 0.00–0.50)

## 2020-02-06 LAB — RETICULOCYTES
Immature Retic Fract: 34.5 % — ABNORMAL HIGH (ref 2.3–15.9)
RBC.: 2.64 MIL/uL — ABNORMAL LOW (ref 3.87–5.11)
Retic Count, Absolute: 134.1 10*3/uL (ref 19.0–186.0)
Retic Ct Pct: 5.1 % — ABNORMAL HIGH (ref 0.4–3.1)

## 2020-02-06 LAB — PHOSPHORUS: Phosphorus: 3.3 mg/dL (ref 2.5–4.6)

## 2020-02-06 LAB — DIRECT ANTIGLOBULIN TEST (NOT AT ARMC)
DAT, IgG: NEGATIVE
DAT, complement: NEGATIVE

## 2020-02-06 LAB — HEPARIN INDUCED PLATELET AB (HIT ANTIBODY): Heparin Induced Plt Ab: 0.094 OD (ref 0.000–0.400)

## 2020-02-06 LAB — SAVE SMEAR(SSMR), FOR PROVIDER SLIDE REVIEW

## 2020-02-06 LAB — FERRITIN: Ferritin: 329 ng/mL — ABNORMAL HIGH (ref 11–307)

## 2020-02-06 LAB — PROCALCITONIN: Procalcitonin: 0.19 ng/mL

## 2020-02-06 LAB — MAGNESIUM: Magnesium: 2.1 mg/dL (ref 1.7–2.4)

## 2020-02-06 LAB — HAPTOGLOBIN: Haptoglobin: 402 mg/dL — ABNORMAL HIGH (ref 37–355)

## 2020-02-06 LAB — C-REACTIVE PROTEIN: CRP: 3 mg/dL — ABNORMAL HIGH (ref <1.0)

## 2020-02-06 MED ORDER — HEPARIN (PORCINE) 25000 UT/250ML-% IV SOLN
950.0000 [IU]/h | INTRAVENOUS | Status: DC
  Administered 2020-02-06 – 2020-02-08 (×3): 950 [IU]/h via INTRAVENOUS
  Filled 2020-02-06 (×2): qty 250

## 2020-02-06 MED ORDER — ARGATROBAN 50 MG/50ML IV SOLN
1.6000 ug/kg/min | INTRAVENOUS | Status: AC
  Administered 2020-02-06 (×2): 1.6 ug/kg/min via INTRAVENOUS
  Filled 2020-02-06 (×3): qty 50

## 2020-02-06 MED ORDER — ARGATROBAN 50 MG/50ML IV SOLN
1.2000 ug/kg/min | INTRAVENOUS | Status: DC
  Administered 2020-02-06: 1 ug/kg/min via INTRAVENOUS
  Administered 2020-02-06: 1.2 ug/kg/min via INTRAVENOUS
  Filled 2020-02-06 (×2): qty 50

## 2020-02-06 MED ORDER — ARGATROBAN 50 MG/50ML IV SOLN
0.7000 ug/kg/min | INTRAVENOUS | Status: DC
  Administered 2020-02-06 (×2): 0.7 ug/kg/min via INTRAVENOUS
  Filled 2020-02-06 (×2): qty 50

## 2020-02-06 NOTE — Progress Notes (Signed)
OT Cancellation Note  Patient Details Name: Julie Mcguire MRN: [id number] DOB: [birth year]   Cancelled Treatment:    Reason Eval/Treat Not Completed: Medical issues which prohibited therapy. Patient continues to be agitated and unable to follow commands on 45 Liters HHFNC. Will f/u as able.  Raihan Kimmel L Terrilyn Tyner 02/06/2020, 3:36 PM

## 2020-02-06 NOTE — Consult Note (Addendum)
[facility]  [phone] Fax:(336) [phone]    INITIAL HEMATOLOGY CONSULTATION  Referring MD:  Dr. Lynden Oxford  Reason for Referral: Thrombocytopenia  HPI: Julie Mcguire is a 64 year old female with a past medical history significant for hypertension and recent COVID-19 pneumonia. She was noted to be COVID-19 positive on 01/07/2020. She has received baricitinib and remdesivir. Remains on Solu-Medrol.  Hospital course has been complicated with ongoing encephalopathy, staph epi UTI, dysphagia, and A. fib with RVR.  Platelet count this admission has been trending downward.  Is down to 71,000 today.  Platelet by citrate was checked on 9/23 and could not be measured secondary to platelet clumping. Due to concern for HIT, she was switched from Lovenox 60 mg daily to argatroban on 02/05/2020.  HIT panel has been sent and is currently pending.  Platelet count on admission from 24-Jan-2020 was 467,000.  Platelet count was initially low on 02/02/2020 when it was 128,000.  The patient has started to receive Lovenox initially at 40 mg daily on Jan 24, 2020 and was increased to 60 mg daily on 01/15/2020.  She received vancomycin from 02/03/2020 and due to complete this today.  She also received heparin 5000 units every 8 hours from 01/07/2020 through 01/08/2020.  The patient is currently receiving IV amiodarone which was started on 02/04/2020.  Nursing is at the bedside at time of visit today.  Her husband was here earlier but left briefly.  The patient did not awaken when I called her name or perform a sternal rub.  However, she opened her eyes to look at me when nursing was adjusting her peripheral IV.  Other than nodding her head at me, she did not speak.  Remains on high flow oxygen.  Nursing reports that she had a small amount of blood from her IV site and had some epistaxis earlier today when they tried to place an NG tube.  No other bleeding has been noted including hematuria, melena,  hematochezia.  Hematology was asked to see the patient to make recommendations regarding her thrombocytopenia.  Past Medical History:  Diagnosis Date  . Hypertension   :    Past Surgical History:  Procedure Laterality Date  . ABDOMINAL HYSTERECTOMY    . APPENDECTOMY    . CHOLECYSTECTOMY    . TONSILLECTOMY    . TOTAL HIP ARTHROPLASTY Left 09/22/2017   Procedure: LEFT TOTAL HIP ARTHROPLASTY ANTERIOR APPROACH;  Surgeon: Kathryne Hitch, MD;  Location: [facility];  Service: Orthopedics;  Laterality: Left;  :   CURRENT MEDS: Current Facility-Administered Medications  Medication Dose Route Frequency Provider Last Rate Last Admin  . 0.9 %  sodium chloride infusion   Intravenous PRN Drema Dallas, MD 10 mL/hr at 02/06/20 0204 Rate Verify at 02/06/20 0204  . 0.9 %  sodium chloride infusion  250 mL Intravenous PRN Rolly Salter, MD      . acetaminophen (TYLENOL) tablet 650 mg  650 mg Oral Q6H PRN [place], Eric J, DO   650 mg at 02/03/20 1419  . albuterol (VENTOLIN HFA) 108 (90 Base) MCG/ACT inhaler 2 puff  2 puff Inhalation Q4H PRN Lonia Blood, MD      . amiodarone (NEXTERONE PREMIX) 360-4.14 MG/200ML-% (1.8 mg/mL) IV infusion  30 mg/hr Intravenous Continuous Rust-Chester, Britton L, NP 16.67 mL/hr at 02/06/20 0845 30 mg/hr at 02/06/20 0845  . argatroban 1 mg/mL infusion  1 mcg/kg/min Intravenous Continuous Adalberto Cole, RPH 6.45 mL/hr at 02/06/20 0649 1 mcg/kg/min at 02/06/20  1610  . chlorhexidine (PERIDEX) 0.12 % solution 15 mL  15 mL Mouth Rinse BID Lonia Blood, MD   15 mL at 02/05/20 0951  . Chlorhexidine Gluconate Cloth 2 % PADS 6 each  6 each Topical Daily [place], Eric J, DO   6 each at 02/05/20 9604  . diclofenac Sodium (VOLTAREN) 1 % topical gel 2 g  2 g Topical QID PRN Lonia Blood, MD   2 g at 02/02/20 2151  . diltiazem (CARDIZEM) 125 mg in dextrose 5% 125 mL (1 mg/mL) infusion  5-15 mg/hr Intravenous Titrated Drema Dallas, MD 15 mL/hr at 02/06/20  0846 15 mg/hr at 02/06/20 0846  . Gerhardt's butt cream   Topical BID Drema Dallas, MD   Given at 02/05/20 2104  . haloperidol lactate (HALDOL) injection 2 mg  2 mg Intravenous Q6H PRN Rolly Salter, MD   2 mg at 02/05/20 1240  . lip balm (CARMEX) ointment   Topical PRN [place], Alvira Philips, DO      . LORazepam (ATIVAN) injection 2-4 mg  2-4 mg Intravenous Q4H PRN Rolly Salter, MD   2 mg at 02/06/20 0221  . MEDLINE mouth rinse  15 mL Mouth Rinse BID Lonia Blood, MD   15 mL at 02/05/20 2104  . methylPREDNISolone sodium succinate (SOLU-MEDROL) 40 mg/mL injection 20 mg  20 mg Intravenous Q24H Mannam, Praveen, MD   20 mg at 02/06/20 0539  . metoprolol tartrate (LOPRESSOR) injection 7.5 mg  7.5 mg Intravenous Q6H Kroeger, Krista M., PA-C   7.5 mg at 02/06/20 0539  . morphine 2 MG/ML injection 1 mg  1 mg Intravenous Q4H PRN Drema Dallas, MD   1 mg at 02/05/20 1259  . nystatin (MYCOSTATIN/NYSTOP) topical powder   Topical BID Lonia Blood, MD   Given at 02/05/20 2104  . ondansetron (ZOFRAN) injection 4 mg  4 mg Intravenous Q8H PRN Marikay Alar, FNP   4 mg at 02/03/20 2015  . sodium chloride flush (NS) 0.9 % injection 3 mL  3 mL Intravenous Q12H Lynden Oxford M, MD      . sodium chloride flush (NS) 0.9 % injection 3 mL  3 mL Intravenous PRN Rolly Salter, MD          Allergies  Allergen Reactions  . Codeine Nausea And Vomiting  . Heparin     Per HIT testing protocol  :  History reviewed. No pertinent family history.:  Social History   Socioeconomic History  . Marital status: Single    Spouse name: Not on file  . Number of children: Not on file  . Years of education: Not on file  . Highest education level: Not on file  Occupational History  . Not on file  Tobacco Use  . Smoking status: Former Smoker    Packs/day: 1.00    Years: 15.00    Pack years: 15.00    Quit date: 05/17/1995    Years since quitting: 24.7  . Smokeless tobacco: Never Used  Vaping Use  .  Vaping Use: Never used  Substance and Sexual Activity  . Alcohol use: Yes    Comment: occas  . Drug use: Never  . Sexual activity: Yes  Other Topics Concern  . Not on file  Social History Narrative  . Not on file   Social Determinants of Health   Financial Resource Strain:   . Difficulty of Paying Living Expenses: Not on file  Food Insecurity:   .  Worried About Programme researcher, broadcasting/film/videounning Out of Food in the Last Year: Not on file  . Ran Out of Food in the Last Year: Not on file  Transportation Needs:   . Lack of Transportation (Medical): Not on file  . Lack of Transportation (Non-Medical): Not on file  Physical Activity:   . Days of Exercise per Week: Not on file  . Minutes of Exercise per Session: Not on file  Stress:   . Feeling of Stress : Not on file  Social Connections:   . Frequency of Communication with Friends and Family: Not on file  . Frequency of Social Gatherings with Friends and Family: Not on file  . Attends Religious Services: Not on file  . Active Member of Clubs or Organizations: Not on file  . Attends BankerClub or Organization Meetings: Not on file  . Marital Status: Not on file  Intimate Partner Violence:   . Fear of Current or Ex-Partner: Not on file  . Emotionally Abused: Not on file  . Physically Abused: Not on file  . Sexually Abused: Not on file  :  REVIEW OF SYSTEMS: Unable to obtain a comprehensive review of systems secondary to patient condition.  Exam: Patient Vitals for the past 24 hrs:  BP Temp Temp src Pulse Resp SpO2 Weight  02/06/20 0800 (!) 140/57 99.7 F (37.6 C) -- 98 (!) 39 91 % --  02/06/20 0748 (!) 151/55 -- -- 98 (!) 22 90 % --  02/06/20 0700 (!) 140/50 99.7 F (37.6 C) -- 84 (!) 22 92 % --  02/06/20 0600 (!) 126/47 99.7 F (37.6 C) -- 79 (!) 22 95 % --  02/06/20 0500 (!) 130/46 99.5 F (37.5 C) -- (!) 104 20 92 % 107.8 kg  02/06/20 0400 (!) 144/43 99.5 F (37.5 C) -- 85 (!) 22 95 % --  02/06/20 0300 136/60 99.5 F (37.5 C) -- 89 19 97 % --   02/06/20 0200 (!) 129/49 99.7 F (37.6 C) -- 85 (!) 25 94 % --  02/06/20 0100 124/71 99.7 F (37.6 C) -- 93 (!) 30 90 % --  02/06/20 0000 130/70 99.5 F (37.5 C) -- 79 18 91 % --  02/05/20 2300 (!) 150/75 99.3 F (37.4 C) -- 85 19 93 % --  02/05/20 2200 129/60 99.1 F (37.3 C) -- 87 17 (!) 84 % --  02/05/20 2100 (!) 132/48 99.1 F (37.3 C) -- 80 15 (!) 88 % --  02/05/20 2032 -- -- -- 74 13 92 % --  02/05/20 2000 (!) 123/37 99.3 F (37.4 C) -- 78 13 95 % --  02/05/20 1900 (!) 133/54 100 F (37.8 C) -- 82 18 92 % --  02/05/20 1800 (!) 133/43 99.9 F (37.7 C) -- 88 17 (!) 84 % --  02/05/20 1700 (!) 151/52 99.7 F (37.6 C) -- 86 19 (!) 86 % --  02/05/20 1600 -- 99.5 F (37.5 C) Core -- -- -- --  02/05/20 1500 (!) 138/94 99.5 F (37.5 C) -- (!) 150 -- 94 % --  02/05/20 1400 124/73 99.7 F (37.6 C) -- 88 -- 96 % --  02/05/20 1346 -- -- -- -- -- 92 % --  02/05/20 1300 136/82 99.9 F (37.7 C) -- (!) 149 -- (!) 84 % --  02/05/20 1200 129/63 100 F (37.8 C) -- 83 -- 92 % --    General: Lethargic, opens eyes but does not answer questions Eyes:  no scleral icterus.   ENT: No thrush  or mucositis, dried blood noted on her left lower lip and dried blood noted at nares. Lymphatics:  Negative cervical, supraclavicular or axillary adenopathy.   Respiratory: Breath sounds diminished throughout. Cardiovascular: Irregular, trace edema GI:  abdomen was soft, flat, nontender, nondistended, without organomegaly.   Skin: No petechiae, ecchymoses noted to left arm, and lower abdomen Neuro: Opens eyes, does not answer questions  LABS:  Lab Results  Component Value Date   WBC 15.5 (H) 02/06/2020   HGB 8.1 (L) 02/06/2020   HCT 25.8 (L) 02/06/2020   PLT 71 (L) 02/06/2020   GLUCOSE 103 (H) 02/06/2020   TRIG 120 01/11/2020   ALT 68 (H) 02/06/2020   AST 36 02/06/2020   NA 146 (H) 02/06/2020   K 3.5 02/06/2020   CL 110 02/06/2020   CREATININE 0.97 02/06/2020   BUN 36 (H) 02/06/2020   CO2  26 02/06/2020   INR 1.1 02/05/2020   HGBA1C 6.4 (H) 01/23/2020   Blood smear: The polychromasia is increased, 1 nucleated red cell, few ovalocytes, the platelets are moderately decreased in number, no platelet clumps.  The majority of the white cells are mature neutrophils.  There are bands and a few myelocytes.  No blasts. DG Abd 1 View  Result Date: 02/04/2020 CLINICAL DATA:  Check gastric catheter placement EXAM: ABDOMEN - 1 VIEW COMPARISON:  01/31/2020 FINDINGS: Previously seen weighted feeding catheter has been withdrawn and now lies within the distal esophagus. This should be advanced several cm. IMPRESSION: Weighted feeding catheter within the distal esophagus. Electronically Signed   By: Alcide Clever M.D.   On: 02/04/2020 02:24   DG Abd 1 View  Result Date: 01/31/2020 CLINICAL DATA:  Evaluate OG tube placement EXAM: ABDOMEN - 1 VIEW COMPARISON:  January 30, 2020 FINDINGS: The feeding tube is looped back on itself in the upper abdomen at midline, likely in the proximal stomach. IMPRESSION: The feeding tube is looped back on itself in the upper abdomen at midline, likely in the proximal stomach. Electronically Signed   By: Gerome Sam III M.D   On: 01/31/2020 15:17   DG Abd 1 View  Result Date: 01/30/2020 CLINICAL DATA:  Feeding tube placement EXAM: ABDOMEN - 1 VIEW COMPARISON:  01/29/2020 FINDINGS: Feeding tube is seen in the upper neck in the region of the oropharynx. Extensive bilateral airspace opacities throughout the lungs. Nonobstructive bowel gas pattern. IMPRESSION: Feeding tube seen in the oropharyngeal region. Extensive bilateral airspace disease, stable. Electronically Signed   By: Charlett Nose M.D.   On: 01/30/2020 12:41   DG Abd 1 View  Result Date: 01/29/2020 CLINICAL DATA:  Check feeding catheter placement EXAM: ABDOMEN - 1 VIEW COMPARISON:  CT from 01/16/2020 FINDINGS: Feeding catheter is noted coiled over the lower left chest lying within the gastric fundus which is  within a hiatal hernia. IMPRESSION: Feeding catheter coiled within the fundus of the stomach in a hiatal hernia. Electronically Signed   By: Alcide Clever M.D.   On: 01/29/2020 13:45   CT HEAD WO CONTRAST  Result Date: 01/27/2020 CLINICAL DATA:  Mental status change EXAM: CT HEAD WITHOUT CONTRAST TECHNIQUE: Contiguous axial images were obtained from the base of the skull through the vertex without intravenous contrast. COMPARISON:  None. FINDINGS: Brain: No evidence of acute infarction, hemorrhage, extra-axial collection, ventriculomegaly, or mass effect. Generalized cerebral atrophy. Periventricular white matter low attenuation likely secondary to microangiopathy. Vascular: No significant cerebrovascular atherosclerosis. No hyperdense vessel. Skull: Negative for fracture or focal lesion. Sinuses/Orbits: Visualized portions of  the orbits are unremarkable. Visualized portions of the paranasal sinuses are unremarkable. Bilateral mastoid effusions. Other: None. IMPRESSION: 1. No acute intracranial pathology. 2. Chronic microvascular disease and cerebral atrophy. 3. Bilateral mastoid effusions. Electronically Signed   By: Elige Ko   On: 01/27/2020 14:07   CT ANGIO CHEST PE W OR WO CONTRAST  Result Date: 01/16/2020 CLINICAL DATA:  Pulmonary embolus suspected, positive D-dimer inpatient with recent COVID pneumonia, hypertension, moderate obesity presenting to the ER with worsening shortness of breath. EXAM: CT ANGIOGRAPHY CHEST WITH CONTRAST TECHNIQUE: Multidetector CT imaging of the chest was performed using the standard protocol during bolus administration of intravenous contrast. Multiplanar CT image reconstructions and MIPs were obtained to evaluate the vascular anatomy. CONTRAST:  OMNIPAQUE IOHEXOL 350 MG/ML SOLN COMPARISON:  CT angio chest 05/01/2004. FINDINGS: Cardiovascular: Satisfactory opacification of the pulmonary arteries to the segmental level. No evidence of pulmonary embolism. Normal heart  size. No significant pericardial effusion. The thoracic aorta is normal in caliber. Mild atherosclerotic plaque. Mediastinum/Nodes: Moderate volume hiatal hernia. There is a right 1.4 cm precarinal lymph node is likely reactive in etiology open (4:39). No enlarged hilar, or axillary lymph nodes. Thyroid gland, trachea, and esophagus demonstrate no significant findings. Lungs/Pleura: Extensive diffuse peripheral and peribronchovascular ground-glass and consolidative opacities that are more prominent in bilateral upper lobes. No pulmonary nodule or mass in the expanded lungs. No pleural effusion. No pneumothorax. Upper Abdomen: No acute abnormality.  Status post cholecystectomy. Musculoskeletal: At least moderate multilevel degenerative changes of the spine. Review of the MIP images confirms the above findings. IMPRESSION: No pulmonary embolus to the segmental level. Findings consistent with known COVID 19 infection with superimposed pulmonary edema. Associated, likely reactive, 1.4 cm right precarinal lymph node. Aortic Atherosclerosis (ICD10-I70.0). Electronically Signed   By: Tish Frederickson M.D.   On: 01/16/2020 14:13   DG CHEST PORT 1 VIEW  Result Date: 02/04/2020 CLINICAL DATA:  Shortness of breath.  History of COVID pneumonia. EXAM: PORTABLE CHEST 1 VIEW COMPARISON:  01/31/2020. FINDINGS: Interim removal of feeding tube. Heart size stable. Diffuse severe bilateral pulmonary infiltrates are again noted without interim change. No pleural effusion or pneumothorax. IMPRESSION: 1.  Interim removal of feeding tube. 2. Diffuse severe bilateral pulmonary infiltrates are again noted without interim change. Electronically Signed   By: Maisie Fus  Register   On: 02/04/2020 09:55   DG CHEST PORT 1 VIEW  Result Date: 01/31/2020 CLINICAL DATA:  Shortness of breath.  COVID-19. EXAM: PORTABLE CHEST 1 VIEW COMPARISON:  January 29, 2020. FINDINGS: Stable cardiomediastinal silhouette. Feeding tube is seen entering stomach.  No pneumothorax or pleural effusion is noted. Stable bilateral patchy airspace opacities are noted consistent with multifocal pneumonia. Bony thorax is unremarkable. IMPRESSION: Stable bilateral multifocal pneumonia. Electronically Signed   By: Lupita Raider M.D.   On: 01/31/2020 08:55   DG CHEST PORT 1 VIEW  Result Date: 01/29/2020 CLINICAL DATA:  COVID-19 positivity EXAM: PORTABLE CHEST 1 VIEW COMPARISON:  01/25/2020 FINDINGS: Cardiac shadow is stable. Diffuse bilateral airspace opacities are again identified stable in appearance from the prior exam. No new focal abnormality is noted. No bony abnormality is noted. IMPRESSION: Stable airspace opacities bilaterally consistent with the given clinical history. Electronically Signed   By: Alcide Clever M.D.   On: 01/29/2020 07:22   DG Chest Port 1 View  Result Date: 01/25/2020 CLINICAL DATA:  COVID pneumonia EXAM: PORTABLE CHEST 1 VIEW COMPARISON:  Radiograph 01/21/2020 FINDINGS: Stable cardiac silhouette. Patchy bilateral airspace disease is slightly improved  compared to prior. No pneumothorax. No pleural fluid. IMPRESSION: Slight improvement in patchy bilateral airspace disease. Electronically Signed   By: Genevive Bi M.D.   On: 01/25/2020 06:59   DG CHEST PORT 1 VIEW  Result Date: 01/21/2020 CLINICAL DATA:  Shortness of breath.  COVID. EXAM: PORTABLE CHEST 1 VIEW COMPARISON:  01/20/2020. FINDINGS: Low lung volumes. Diffuse progressive dense bilateral pulmonary infiltrates. Heart size stable. Left pleural effusion cannot be excluded. No pneumothorax. Degenerative changes scoliosis thoracic spine. IMPRESSION: Low lung volumes. Diffuse progressive dense bilateral pulmonary infiltrates. Left pleural effusion cannot be excluded. Electronically Signed   By: Maisie Fus  Register   On: 01/21/2020 05:36   DG CHEST PORT 1 VIEW  Result Date: 01/20/2020 CLINICAL DATA:  Dyspnea EXAM: PORTABLE CHEST 1 VIEW COMPARISON:  12/20/2019 FINDINGS: Shallow inspiration.  Bilateral airspace infiltration with increasing consolidation and air bronchograms on the left since previous study. No pleural effusions. No pneumothorax. Heart size is obscured. IMPRESSION: Bilateral airspace infiltration with increasing consolidation and air bronchograms on the left. Electronically Signed   By: Burman Nieves M.D.   On: 01/20/2020 05:00   DG Chest Port 1 View  Result Date: 01/10/2020 CLINICAL DATA:  COVID positive with shortness of breath. EXAM: PORTABLE CHEST 1 VIEW COMPARISON:  January 07, 2020 FINDINGS: Moderate to marked severity bilateral patchy multifocal infiltrates are seen. The heart size and mediastinal contours are within normal limits. Degenerative changes seen throughout the thoracic spine. IMPRESSION: Moderate to marked severity bilateral patchy multifocal infiltrates. Electronically Signed   By: Aram Candela M.D.   On: 01/06/2020 16:59   DG Chest Port 1 View  Result Date: 01/07/2020 CLINICAL DATA:  Shortness of breath and cough with COVID-19 positivity, initial encounter EXAM: PORTABLE CHEST 1 VIEW COMPARISON:  05/05/2004 FINDINGS: Cardiac shadow is enlarged. Aortic calcifications are noted. The lungs are well aerated bilaterally. Patchy airspace opacities are noted left greater than right consistent with the given clinical history of COVID-19 positivity. No bony abnormality is noted. IMPRESSION: Patchy airspace opacities consistent with the given clinical history of COVID-19 positivity. Aortic Atherosclerosis (ICD10-I70.0). Electronically Signed   By: Alcide Clever M.D.   On: 01/07/2020 16:25   DG INTRO LONG GI TUBE  Result Date: 01/30/2020 CLINICAL DATA:  Feeding tube placement EXAM: FL FEEDING TUBE PLACEMENT FLUOROSCOPY TIME:  Fluoroscopy Time:  8 minutes 12 seconds Radiation Exposure Index (if provided by the fluoroscopic device): Number of Acquired Spot Images: 0 COMPARISON:  None. FINDINGS: Fluoroscopic guidance was utilized to advance the feeding tube from  the oropharynx into the esophagus. Feeding tube coiled in the hiatal hernia. Multiple attempts were made to pass the feeding tube through the hiatal hernia unsuccessfully. This included multiple different guiding wires. Finally, after numerous unsuccessful attempts, the feeding tube was looped in the hiatal hernia and secured to the nose. IMPRESSION: Unsuccessful attempts at advancing the feeding tube through the hiatal hernia despite multiple attempts with different guiding wires. Electronically Signed   By: Charlett Nose M.D.   On: 01/30/2020 16:57   EEG adult  Result Date: 02/05/2020 Charlsie Quest, MD     02/05/2020  2:41 PM Patient Name: MACKENZIE GROOM MRN: [id number] Epilepsy Attending: Charlsie Quest Referring Physician/Provider: Dr Lynden Oxford Date: 02/05/2020 Duration: 25.14 mins Patient history: 63yo F with ams. EEG to evaluate for seizure. Level of alertness: Awake AEDs during EEG study: None Technical aspects: This EEG study was done with scalp electrodes positioned according to the 10-20 International system of electrode placement. Lobbyist  activity was acquired at a sampling rate of 500Hz  and reviewed with a high frequency filter of 70Hz  and a low frequency filter of 1Hz . EEG data were recorded continuously and digitally stored. Description: No posterior dominant rhythm was seen. EEG showed continuous generalized 3 to 6 Hz theta-delta slowing. Hyperventilation and photic stimulation were not performed.   ABNORMALITY -Continuous slow, generalized IMPRESSION: This study is suggestive of moderate diffuse encephalopathy, nonspecific etiology. No seizures or epileptiform discharges were seen throughout the recording. Charlsie Quest   ECHOCARDIOGRAM COMPLETE  Result Date: 02/05/2020    ECHOCARDIOGRAM REPORT   Patient Name:   JODECI RINI [facility] Date of Exam: 02/05/2020 Medical Rec #:  [id number]       Height:       64.0 in Accession #:    [id number]      Weight:       237.0 lb Date of Birth:   1955-07-02      BSA:          2.103 m Patient Age:    63 years        BP:           133/91 mmHg Patient Gender: F               HR:           157 bpm. Exam Location:  Inpatient Procedure: 2D Echo, Cardiac Doppler and Color Doppler                                MODIFIED REPORT:   This report was modified by Laurance Flatten MD on 02/05/2020 due to Change                                     TAPSE.  Indications:     Atrial Fibrillation 427.31 / I48.91  History:         Patient has no prior history of Echocardiogram examinations.                  Risk Factors:Hypertension and Former Smoker.  Sonographer:     Renella Cunas RDCS Referring Phys:  [id number] [facility] M PATEL Diagnosing Phys: Laurance Flatten MD IMPRESSIONS  1. Left ventricular ejection fraction, by estimation, is 60 to 65%. The left ventricle has normal function. The left ventricle has no regional wall motion abnormalities. Indeterminate diastolic filling due to E-A fusion.  2. Right ventricular systolic function is normal. The right ventricular size is normal.  3. The mitral valve is normal in structure. Trivial mitral valve regurgitation. No evidence of mitral stenosis.  4. The aortic valve is tricuspid. There is mild thickening of the aortic valve. Aortic valve regurgitation is not visualized. Mild aortic valve sclerosis is present, with no evidence of aortic valve stenosis.  5. The inferior vena cava is normal in size with greater than 50% respiratory variability, suggesting right atrial pressure of 3 mmHg. FINDINGS  Left Ventricle: Left ventricular ejection fraction, by estimation, is 60 to 65%. The left ventricle has normal function. The left ventricle has no regional wall motion abnormalities. The left ventricular internal cavity size was normal in size. There is  no left ventricular hypertrophy. Indeterminate diastolic filling due to E-A fusion. Right Ventricle: The right ventricular size is normal. No increase in right ventricular wall thickness. Right  ventricular systolic function is  normal. Left Atrium: Left atrial size was normal in size. Right Atrium: Right atrial size was normal in size. Pericardium: There is no evidence of pericardial effusion. Mitral Valve: The mitral valve is normal in structure. There is mild thickening of the mitral valve leaflet(s). Trivial mitral valve regurgitation. No evidence of mitral valve stenosis. Tricuspid Valve: Peak PASP +RAP. The tricuspid valve is normal in structure. Tricuspid valve regurgitation is mild. Aortic Valve: The aortic valve is tricuspid. There is mild thickening of the aortic valve. There is mild aortic valve annular calcification. Aortic valve regurgitation is not visualized. Mild aortic valve sclerosis is present, with no evidence of aortic valve stenosis. Pulmonic Valve: The pulmonic valve was normal in structure. Pulmonic valve regurgitation is not visualized. Aorta: The aortic root is normal in size and structure. Venous: The inferior vena cava is normal in size with greater than 50% respiratory variability, suggesting right atrial pressure of 3 mmHg. IAS/Shunts: The atrial septum is grossly normal.  LEFT VENTRICLE PLAX 2D LVIDd:         3.65 cm LVIDs:         2.10 cm LV PW:         0.90 cm LV IVS:        0.90 cm LVOT diam:     2.00 cm LV SV:         45 LV SV Index:   21 LVOT Area:     3.14 cm  LEFT ATRIUM             Index       RIGHT ATRIUM           Index LA Vol (A2C):   44.3 ml 21.07 ml/m RA Area:     11.90 cm LA Vol (A4C):   31.1 ml 14.79 ml/m RA Volume:   27.90 ml  13.27 ml/m LA Biplane Vol: 37.8 ml 17.98 ml/m  AORTIC VALVE LVOT Vmax:   112.00 cm/s LVOT Vmean:  79.200 cm/s LVOT VTI:    0.142 m  AORTA Ao Root diam: 2.90 cm  SHUNTS Systemic VTI:  0.14 m Systemic Diam: 2.00 cm Laurance Flatten MD Electronically signed by Laurance Flatten MD Signature Date/Time: 02/05/2020/12:02:16 PM    Final (Updated)    VAS [place] LOWER EXTREMITY VENOUS (DVT)  Result Date: 01/16/2020  Lower Venous  DVTStudy Indications: Elevated ddimer, covid +.  Performing Technologist: Blanch Media RVS  Examination Guidelines: A complete evaluation includes B-mode imaging, spectral Doppler, color Doppler, and power Doppler as needed of all accessible portions of each vessel. Bilateral testing is considered an integral part of a complete examination. Limited examinations for reoccurring indications may be performed as noted. The reflux portion of the exam is performed with the patient in reverse Trendelenburg.  +---------+---------------+---------+-----------+----------+--------------+ RIGHT    CompressibilityPhasicitySpontaneityPropertiesThrombus Aging +---------+---------------+---------+-----------+----------+--------------+ CFV      Full           Yes      Yes                                 +---------+---------------+---------+-----------+----------+--------------+ SFJ      Full                                                        +---------+---------------+---------+-----------+----------+--------------+  FV Prox  Full                                                        +---------+---------------+---------+-----------+----------+--------------+ FV Mid   Full                                                        +---------+---------------+---------+-----------+----------+--------------+ FV DistalFull                                                        +---------+---------------+---------+-----------+----------+--------------+ PFV      Full                                                        +---------+---------------+---------+-----------+----------+--------------+ POP      Full           Yes      Yes                                 +---------+---------------+---------+-----------+----------+--------------+ PTV      Full                                                         +---------+---------------+---------+-----------+----------+--------------+ PERO     Full                                                        +---------+---------------+---------+-----------+----------+--------------+   +---------+---------------+---------+-----------+----------+--------------+ LEFT     CompressibilityPhasicitySpontaneityPropertiesThrombus Aging +---------+---------------+---------+-----------+----------+--------------+ CFV      Full           Yes      Yes                                 +---------+---------------+---------+-----------+----------+--------------+ SFJ      Full                                                        +---------+---------------+---------+-----------+----------+--------------+ FV Prox  Full                                                        +---------+---------------+---------+-----------+----------+--------------+  FV Mid                  Yes      Yes                                 +---------+---------------+---------+-----------+----------+--------------+ FV Distal               Yes      Yes                                 +---------+---------------+---------+-----------+----------+--------------+ PFV      Full                                                        +---------+---------------+---------+-----------+----------+--------------+ POP      Full           Yes      Yes                                 +---------+---------------+---------+-----------+----------+--------------+ PTV      Full                                                        +---------+---------------+---------+-----------+----------+--------------+ PERO     Full                                                        +---------+---------------+---------+-----------+----------+--------------+     Summary: BILATERAL: - No evidence of deep vein thrombosis seen in the lower extremities, bilaterally. - No evidence of  superficial venous thrombosis in the lower extremities, bilaterally. -   *See table(s) above for measurements and observations. Electronically signed by Lemar Livings MD on 01/16/2020 at 4:49:58 PM.    Final      ASSESSMENT AND PLAN:  1.  Thrombocytopenia 2.  Leukocytosis secondary to steroids and infection/inflammation 3.  Anemia-multifactorial 4.  Acute hypoxic respiratory failure secondary to COVID-19 pneumonia 5.  Sepsis, resolved 6.  Acute metabolic encephalopathy 7.  Staph epi urinary tract infection 8.  Atrial fibrillation with RVR  Ms. Weinrich was admitted to the hospital with acute hypoxic respiratory failure secondary to COVID-19 pneumonia.  Hospital course has been complicated by ongoing encephalopathy, staph epi UTI, dysphagia, and A. fib with RVR.  She has now developed thrombocytopenia over about the past 5 days.  4T score = 3 indicating low probability of HIT. HIT panel pending.  Lovenox has been discontinued and now on argatroban since 9/22.  I have a low suspicion for HIT and think thrombocytopenia is more likely due to COVID-19 infection, acute illness, and medications.  Will review peripheral blood smear.  Recommendations: 1.  We will review peripheral blood smear 2.  We will follow up on HIT panel 3.  Monitor CBC closely.  No transfusion is indicated  unless platelet count is below 10,000 or she develops active bleeding. 4.  Transfuse packed red blood cells as indicated per the critical care team  5.  Check reticulocyte count and DAT  Thank you for this referral.  Clenton Pare, DNP, AGPCNP-BC, AOCNP Mon/Tues/Thurs/Fri 7am-5pm; Off Wednesdays Cell: [phone]  Julie Mcguire was interviewed and examined.  I reviewed the peripheral blood smear.  Her husband was at the bedside when I saw her this afternoon.  She appeared lethargic, confused, and was not following commands.  She has been admitted initially with COVID-19 infection 01/07/2020 and required repeat hospital  admission on 02-05-2020.  She remains hospitalized with respiratory failure.  She has developed a progressive fall in the platelet count over the past week.  The thrombocytopenia is most likely related to bone marrow suppression in the setting of COVID-19 infection, though polypharmacy may be contributing. 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 I have a low clinical suspicion for HIT.  There has been no documented thrombosis.  HIT is uncommon with Lovenox as opposed to unfractionated heparin.  She has a low probability versus intermediate probability (most likely low) HIT score.  I agree with discontinuing Lovenox for now.  I would consider discontinuing argatroban if she has significant bleeding.  She can be started on an alternative form of anticoagulation therapy for atrial fibrillation when the platelet count recovers.  The hemoglobin has dropped over the past several days. This may be related to bleeding from procedures. We will check a reticulocyte count and DAT to look for evidence of hemolysis.

## 2020-02-06 NOTE — Progress Notes (Signed)
ANTICOAGULATION CONSULT NOTE - Consult  Pharmacy Consult for Argatroban Indication: possible HIT  Allergies  Allergen Reactions  . Codeine Nausea And Vomiting  . Heparin     Per HIT testing protocol    Patient Measurements: Height: 5\' 4"  (162.6 cm) Weight: 107.5 kg (236 lb 15.9 oz) IBW/kg (Calculated) : 54.7  Vital Signs: Temp: 99.3 F (37.4 C) (09/22 2300) Temp Source: Core (09/22 1600) BP: 150/75 (09/22 2300) Pulse Rate: 85 (09/22 2300)  Labs: Recent Labs    02/04/20 0232 02/04/20 0232 02/05/20 0241 02/05/20 0852 02/05/20 2121 02/06/20 0022  HGB 9.2*   < > 8.0*  --   --  8.1*  HCT 29.5*  --  26.4*  --   --  25.8*  PLT 120*  --  96*  --   --  71*  APTT  --   --   --   --  <20* 20*  LABPROT  --   --   --  14.0  --   --   INR  --   --   --  1.1  --   --   CREATININE 1.05*  --  1.03*  --   --  0.97   < > = values in this interval not displayed.    Estimated Creatinine Clearance: 72.9 mL/min (by C-G formula based on SCr of 0.97 mg/dL).   Medical History: Past Medical History:  Diagnosis Date  . Hypertension    Assessment: 64 y/o F admitted with worsening ShOB after previously being hospitalized for COVID-PNA. Hospital course has been prolonged and complicated by encephalopathy, UTI, dysphagia, and AFIB with RVR. Platelets along with hemoglobin have been progressively declining with PLT decreased significantly in the last several days. Patient seems to be outside of the window for HIT but after discussing with Dr. 44, other causes for severe thrombocytopenia seem less likely. 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 Plan is to proceed to order HIT panel and proceed with argatroban.   4T score= 4  Today, 02/06/20  Initial aPTT was <20. Felt that was a spurious result as pt is on  Argatroban, and therefore should have been higher  Ordered another aPTT and this level also came back low at 20   Plt remain low at 71   Hgb 8.1 low but  consistent with previous result   No line or bleeding issues per RN. RN reports no line interruptions and that med is infusing properly   Goal of Therapy:  APTT goal 50-90 Monitor platelets by anticoagulation protocol: Yes   Plan:   Increase argatroban to 0.7 mcg/kg/min  Check aPTT 2 hours after rate change   aPTT q 2 hours until 2 therapeutic levels then daily  Daily CBC    02/04/2020, PharmD, BCPS 02/06/2020 1:14 AM

## 2020-02-06 NOTE — Progress Notes (Addendum)
Progress Note  Patient Name: Julie Mcguire Date of Encounter: 02/06/2020  Primary Cardiologist: Olga Millers, MD   Subjective   Patient sleeping. Does not waken to talking or touch. No overnight events documented.   Inpatient Medications    Scheduled Meds: . chlorhexidine  15 mL Mouth Rinse BID  . Chlorhexidine Gluconate Cloth  6 each Topical Daily  . Gerhardt's butt cream   Topical BID  . mouth rinse  15 mL Mouth Rinse BID  . methylPREDNISolone (SOLU-MEDROL) injection  20 mg Intravenous Q24H  . metoprolol tartrate  7.5 mg Intravenous Q6H  . nystatin   Topical BID  . sodium chloride flush  3 mL Intravenous Q12H   Continuous Infusions: . sodium chloride 10 mL/hr at 02/06/20 0204  . sodium chloride    . amiodarone 30 mg/hr (02/06/20 0204)  . argatroban 1 mcg/kg/min (02/06/20 0649)  . diltiazem (CARDIZEM) infusion 15 mg/hr (02/06/20 0204)   PRN Meds: sodium chloride, sodium chloride, acetaminophen, albuterol, diclofenac Sodium, haloperidol lactate, lip balm, LORazepam, morphine injection, ondansetron (ZOFRAN) IV, sodium chloride flush   Vital Signs    Vitals:   02/06/20 0600 02/06/20 0700 02/06/20 0748 02/06/20 0800  BP: (!) 126/47 (!) 140/50 (!) 151/55 (!) 140/57  Pulse: 79 84 98 98  Resp: (!) 22 (!) 22 (!) 22 (!) 39  Temp: 99.7 F (37.6 C) 99.7 F (37.6 C)  99.7 F (37.6 C)  TempSrc:      SpO2: 95% 92% 90% 91%  Weight:      Height:        Intake/Output Summary (Last 24 hours) at 02/06/2020 0829 Last data filed at 02/06/2020 0500 Gross per 24 hour  Intake 2331.36 ml  Output 975 ml  Net 1356.36 ml   Filed Weights   02/02/20 0500 02/05/20 0607 02/06/20 0500  Weight: 108.7 kg 107.5 kg 107.8 kg    Telemetry    Atrial flutter with variable AV block - HR generally in the 80s-100s over the past 12 hours. Occasional PVCs. VT alarms appear to be artifact - Personally Reviewed  ECG    No new tracings - Personally Reviewed  Physical Exam   GEN: sleeping  in no acute distress.   Neck: No JVD, no carotid bruits Cardiac: IRIR, no murmurs, rubs, or gallops.  Respiratory: Clear to auscultation bilaterally, no wheezes/ rales/ rhonchi GI: NABS, Soft, obese, nontender, non-distended  MS: No edema; No deformity. Neuro:  somnolence limits assessment Psych: sleeping  Labs    Chemistry Recent Labs  Lab 02/04/20 0232 02/05/20 0241 02/06/20 0022  NA 146* 144 146*  K 4.1 3.5 3.5  CL 108 110 110  CO2 26 23 26   GLUCOSE 173* 116* 103*  BUN 57* 52* 36*  CREATININE 1.05* 1.03* 0.97  CALCIUM 9.3 8.5* 8.7*  PROT 5.8* 5.3* 5.5*  ALBUMIN 2.8* 2.5* 2.5*  AST 32 37 36  ALT 60* 66* 68*  ALKPHOS 67 58 60  BILITOT 0.7 0.5 0.6  GFRNONAA 57* 59* >60  GFRAA >60 >60 >60  ANIONGAP 12 11 10      Hematology Recent Labs  Lab 02/04/20 0232 02/05/20 0241 02/06/20 0022  WBC 23.4* 15.2* 15.5*  RBC 2.98* 2.62* 2.63*  HGB 9.2* 8.0* 8.1*  HCT 29.5* 26.4* 25.8*  MCV 99.0 100.8* 98.1  MCH 30.9 30.5 30.8  MCHC 31.2 30.3 31.4  RDW 15.5 15.9* 16.1*  PLT 120* 96* 71*    Cardiac EnzymesNo results for input(s): TROPONINI in the last 168 hours. No results  for input(s): TROPIPOC in the last 168 hours.   BNPNo results for input(s): BNP, PROBNP in the last 168 hours.   DDimer  Recent Labs  Lab 02/04/20 0232 02/05/20 0241 02/06/20 0022  DDIMER 1.28* 0.73* 0.88*     Radiology    DG CHEST PORT 1 VIEW  Result Date: 02/04/2020 CLINICAL DATA:  Shortness of breath.  History of COVID pneumonia. EXAM: PORTABLE CHEST 1 VIEW COMPARISON:  01/31/2020. FINDINGS: Interim removal of feeding tube. Heart size stable. Diffuse severe bilateral pulmonary infiltrates are again noted without interim change. No pleural effusion or pneumothorax. IMPRESSION: 1.  Interim removal of feeding tube. 2. Diffuse severe bilateral pulmonary infiltrates are again noted without interim change. Electronically Signed   By: Maisie Fus  Register   On: 02/04/2020 09:55   EEG adult  Result Date:  02/05/2020 Charlsie Quest, MD     02/05/2020  2:41 PM Patient Name: Julie Mcguire MRN: [id number] Epilepsy Attending: Charlsie Quest Referring Physician/Provider: Dr Lynden Oxford Date: 02/05/2020 Duration: 25.14 mins Patient history: 64yo F with ams. EEG to evaluate for seizure. Level of alertness: Awake AEDs during EEG study: None Technical aspects: This EEG study was done with scalp electrodes positioned according to the 10-20 International system of electrode placement. Electrical activity was acquired at a sampling rate of 500Hz  and reviewed with a high frequency filter of 70Hz  and a low frequency filter of 1Hz . EEG data were recorded continuously and digitally stored. Description: No posterior dominant rhythm was seen. EEG showed continuous generalized 3 to 6 Hz theta-delta slowing. Hyperventilation and photic stimulation were not performed.   ABNORMALITY -Continuous slow, generalized IMPRESSION: This study is suggestive of moderate diffuse encephalopathy, nonspecific etiology. No seizures or epileptiform discharges were seen throughout the recording.   ECHOCARDIOGRAM COMPLETE  Result Date: 02/05/2020    ECHOCARDIOGRAM REPORT   Patient Name:   Julie Mcguire [facility] Date of Exam: 02/05/2020 Medical Rec #:  Etta Grandchild       Height:       64.0 in Accession #:    [facility]      Weight:       237.0 lb Date of Birth:  [birth year]      BSA:          2.103 m Patient Age:    63 years        BP:           133/91 mmHg Patient Gender: F               HR:           157 bpm. Exam Location:  Inpatient Procedure: 2D Echo, Cardiac Doppler and Color Doppler                                MODIFIED REPORT:   This report was modified by [id number] MD on 02/05/2020 due to Change                                     TAPSE.  Indications:     Atrial Fibrillation 427.31 / I48.91  History:         Patient has no prior history of Echocardiogram examinations.                  Risk Factors:Hypertension and Former  Smoker.  Sonographer:     Renella CunasJulia Swaim RDCS Referring Phys:  [id number] [facility] Diagnosing Phys: Laurance FlattenHeather Pemberton MD IMPRESSIONS  1. Left ventricular ejection fraction, by estimation, is 60 to 65%. The left ventricle has normal function. The left ventricle has no regional wall motion abnormalities. Indeterminate diastolic filling due to E-A fusion.  2. Right ventricular systolic function is normal. The right ventricular size is normal.  3. The mitral valve is normal in structure. Trivial mitral valve regurgitation. No evidence of mitral stenosis.  4. The aortic valve is tricuspid. There is mild thickening of the aortic valve. Aortic valve regurgitation is not visualized. Mild aortic valve sclerosis is present, with no evidence of aortic valve stenosis.  5. The inferior vena cava is normal in size with greater than 50% respiratory variability, suggesting right atrial pressure of 3 mmHg. FINDINGS  Left Ventricle: Left ventricular ejection fraction, by estimation, is 60 to 65%. The left ventricle has normal function. The left ventricle has no regional wall motion abnormalities. The left ventricular internal cavity size was normal in size. There is  no left ventricular hypertrophy. Indeterminate diastolic filling due to E-A fusion. Right Ventricle: The right ventricular size is normal. No increase in right ventricular wall thickness. Right ventricular systolic function is normal. Left Atrium: Left atrial size was normal in size. Right Atrium: Right atrial size was normal in size. Pericardium: There is no evidence of pericardial effusion. Mitral Valve: The mitral valve is normal in structure. There is mild thickening of the mitral valve leaflet(s). Trivial mitral valve regurgitation. No evidence of mitral valve stenosis. Tricuspid Valve: Peak PASP 34mmHg +RAP. The tricuspid valve is normal in structure. Tricuspid valve regurgitation is mild. Aortic Valve: The aortic valve is tricuspid. There is mild thickening of the  aortic valve. There is mild aortic valve annular calcification. Aortic valve regurgitation is not visualized. Mild aortic valve sclerosis is present, with no evidence of aortic valve stenosis. Pulmonic Valve: The pulmonic valve was normal in structure. Pulmonic valve regurgitation is not visualized. Aorta: The aortic root is normal in size and structure. Venous: The inferior vena cava is normal in size with greater than 50% respiratory variability, suggesting right atrial pressure of 3 mmHg. IAS/Shunts: The atrial septum is grossly normal.  LEFT VENTRICLE PLAX 2D LVIDd:         3.65 cm LVIDs:         2.10 cm LV PW:         0.90 cm LV IVS:        0.90 cm LVOT diam:     2.00 cm LV SV:         45 LV SV Index:   21 LVOT Area:     3.14 cm  LEFT ATRIUM             Index       RIGHT ATRIUM           Index LA Vol (A2C):   44.3 ml 21.07 ml/m RA Area:     11.90 cm LA Vol (A4C):   31.1 ml 14.79 ml/m RA Volume:   27.90 ml  13.27 ml/m LA Biplane Vol: 37.8 ml 17.98 ml/m  AORTIC VALVE LVOT Vmax:   112.00 cm/s LVOT Vmean:  79.200 cm/s LVOT VTI:    0.142 m  AORTA Ao Root diam: 2.90 cm  SHUNTS Systemic VTI:  0.14 m Systemic Diam: 2.00 cm Laurance FlattenHeather Pemberton MD Electronically signed by Laurance FlattenHeather Pemberton MD Signature Date/Time: 02/05/2020/12:02:16 PM  Final (Updated)     Cardiac Studies   Echocardiogram 02/05/20: 1. Left ventricular ejection fraction, by estimation, is 60 to 65%. The  left ventricle has normal function. The left ventricle has no regional  wall motion abnormalities. Indeterminate diastolic filling due to E-A  fusion.  2. Right ventricular systolic function is normal. The right ventricular  size is normal.  3. The mitral valve is normal in structure. Trivial mitral valve  regurgitation. No evidence of mitral stenosis.  4. The aortic valve is tricuspid. There is mild thickening of the aortic  valve. Aortic valve regurgitation is not visualized. Mild aortic valve  sclerosis is present, with no  evidence of aortic valve stenosis.  5. The inferior vena cava is normal in size with greater than 50%  respiratory variability, suggesting right atrial pressure of 3 mmHg.   Patient Profile      64 y.o. female with a PMH of HTN, morbid obesity, and recent COVID-19 PNA, who is being followed by cardiology for the evaluation of atrial fibrillation with RVR   Assessment & Plan    1. New onset atrial fibrillation/flutter with RVR: appears her first episode of atrial fibrillation occurred on 02/02/20 as noted by her EKG. Upon returning from a swallow study 02/04/20, she was found to be tachycardic to the 200s. Adenosine administered and flutter waves were seen. She was started on IV metoprolol and a diltiazem and amiodarone gtt for rate/rhythm control. Suspect this is driven by her underlying infection (COVID-19 PNA and UTI, as well as possible concern for aspiration during her swallow study). Echo reassuring with EF 60-65% and no significant valvular abnormalities. CHA2DS2-VASc Score is 3 (HTN, Aortic plaque on CT scan, and Female). Hgb stable at 8.1 today, though down from 12-13 on admission. PLT also downtrending from 200-300s on admission to 71 today. HIT work-up underway and she was started on argatroban for stroke ppx.  HR generally improved over the past 12 hours - maintaining in the 80s-100s - Continue diltiazem gtt and IV metoprolol for rate control - Continue amiodarone gtt for rhythm control - Continue argatroban per pharmacy. Anticipate transition to eliquis 5mg  BID once clear no further procedures are necessary.   2. COVID-19 PNA: s/p remdesivir and baricitinib. Now on IV steroids - tapering off. She continues to have hypoxic respiratory failure requiring O2 via HFNC/NRB - Continue management per primary team/PCCM  3. HTN: home valsartan-HCTZ, amlodipine, and metoprolol succinate have been on hold this admission. BP has been intermittently elevated - Managed in the context of #1 - Favor  ongoing monitoring given NPO status. Once NG tube re-established, could consider restarting ARB  4. Toxic metabolic encephalopathy: likely 2/2 sedating medication and prolonged critical illness. Now off precedex. Intermittently requiring prn haldol/ativan - Continue management per primary team  5. Dysphagia: Patient pulled previously placed NG tube place by IR overnight 02/04/20 in a state of confusion/agitation. Again failed swallow study 02/04/20. Plan for IR placed NG tube (failed bedside attempts) once HR's improved - Continue management per primary team  6. Anemia/thrombocytopenia: Hgb stable at 8.1 today, though down from 12-13 on admission. PLT also downtrending from 200-300s on admission to 71 today. HIT work-up underway and she was started on argatroban for stroke ppx. - Continue management per primary team  For questions or updates, please contact [facility] Please consult [email or web address] for contact info under Cardiology/STEMI.      Signed, 02/06/20, PA-C  02/06/2020, 8:29 AM   [phone] As above, patient seen and  examined.  She remains confused/encephalopathic and does not respond to questions.  She remains in atrial fibrillation but heart rate much improved.  We will continue amiodarone, Cardizem and metoprolol at present dose.  Plt count 71. HIT labs pending. She is therefore on argatroban.  If she recovers from present illness she will need apixaban for 3 consecutive weeks followed by cardioversion if atrial fibrillation persists. Olga Millers

## 2020-02-06 NOTE — Progress Notes (Signed)
NAME:  Julie Mcguire, MRN:  [id number], DOB:  Jan 12, 1956, LOS: 23 ADMISSION DATE:  01/08/2020, CONSULTATION DATE:  01/28/20 REFERRING MD:  Dr. Sharon Seller, CHIEF COMPLAINT:  SOB  Brief History   64 y/o F admitted with acute hypoxemic respiratory failure in the setting of COVID PNA    Past Medical History  HTN Obesity   Significant Hospital Events   8/24-8/27 - Admit with COVID 8/31- Admit with worsening shortness of breath 9/14 - PCCM consulted for evaluation of encephalopathy and hypoxic respiratory failure 9/15 - Precedex started 9/16 - HHFNC + NRB intermittent , precedex  9/19 - precedex off 9/21 - new AF RVR, diltiazem gtt /amio gtt started, cardiology consulted  Consults:  Cardiology  Procedures:     Significant Diagnostic Tests:   CTA Chest 9/2 >> negative for acute PE to the segmental level, findings consistent with known COVID infection and superimposed pulmonary edema   Venous Duplex BLE 9/2 >> negative bilaterally   Micro Data:  COVID 8/24 >> positive Strep Antigen 9/6 >> negative  U. Legionella 9/6 >> negative  UC 9/18 >> positive Staph Epi >> S- Vancomycin, Nitrofurantoin, Tetracycline  Antimicrobials:  Ceftriaxone 9/6 >> 9/8  Azithromycin 9/6 >> 9/8  Vancomycin 9/20 >> 9/22  Interim history/subjective:  Patient more lethargic this morning, was given Ativan PRN this AM pre-NGT attempt.  Shook head "no" to questions. Husband bedside.  Objective   Blood pressure (!) 151/55, pulse 98, temperature 99.7 F (37.6 C), resp. rate (!) 22, height 5\' 4"  (1.626 m), weight 107.8 kg, SpO2 90 %.    FiO2 (%):  [90 %-100 %] 100 %   Intake/Output Summary (Last 24 hours) at 02/06/2020 0806 Last data filed at 02/06/2020 0500 Gross per 24 hour  Intake 2331.36 ml  Output 975 ml  Net 1356.36 ml   Filed Weights   02/02/20 0500 02/05/20 0607 02/06/20 0500  Weight: 108.7 kg 107.5 kg 107.8 kg   Examination:  General: Ill appearing adult female, lying in bed, minimally  interactive HEENT: MM pink/moist, anicteric, PERRL Neuro: Responsive to voice w/ persistence, unable to answer Q's verbally/shook head "no", follows simple intermittent commands CV: s1s2, irregular, Afib controlled on monitor, no rubs/gallops, pulses +2 R/P Pulm: regular, non-labored on HHFNC 50L/ 100%, breath sounds diminished throughout GI: soft, non tender, bs x 4 Skin: assessment limited, known MSAD Extremities: warm/dry, trace edema BLE, MAE  Labs & Imaging personally reviewed I&O's reviewed Plt: 71 Resolved Hospital Problem list     Hypernatremia   Assessment & Plan:   Acute Hypoxic Respiratory Failure in the setting of COVID PNA / ARDS Treated with baricitinib, remdesivir & steroids during first admit. Initially treated with IV antibiotics x 3 days, PCT not convincing for acute bacterial component. Currently receiving steroids.  - Continue HHFNC 50L/ 100% with NRB PRN, wean O2 as tolerated for SpO2 goal > 88% at rest, > 85% with mobility. - Intermittent CXR - Aggressive Pulmonary hygiene as tolerated: Incentive spirometer 10 x Q1h & flutter valve PRN while awake  Atrial Fibrillation w/ RVR New this admission, unclear if this is new onset or undiagnosed chronic issue S/p Adenosine given 6 mg x 2,10 mg dilt. IVP, Amio bolus, Diltiazem drip & Amio drip started 9/2.   - per cardiology, appreciate input - Diltiazem & Amio drips continued - Argatroban drip started per Pharmacy for anticoagulation due to concerns for HIIT- plt 71. - continue lopressor IVP, per cardiology - continuous cardiac monitoring  Staph Epi UTI Low  grade fever, left-shift on CBC and signs of pain while voiding UC + for > 100,000 colonies of Staph Epi. Vancomycin 3 day course & pyridium 1 dose given.  - Vancomycin completed 9/22 - PCT trending down to 0.19 - monitor fever/WBC curve: tmax- 38.0 (9/22-9/23) / 15.5  Acute Metabolic Encephalopathy Suspect multifactorial in the setting of hypoxia, steroids  & medications Mental status has improved slightly since initiation of abx therapy for UTI. EEG performed 9/22 showing diffuse slowing consistent with moderate diffuse encephalopathy  - per primary - continue Seroquel at night - monitor mental statues - Avoid Benzos, off precedex  Elevated D-Dimer in the setting of COVID - resolving Venous duplex- negative for DVT, CTa chest- negative for PE  - monitor d-dimer, if persistent elevation with fever consider repeat venous duplex - currently on Argatroban drip for Afib  Steroid Induced Hyperglycemia  - Q 4 CBG with SSI - consider discontinuing steroid therapy due to adequate duration of therapy  At Risk Malnutrition  - per primary - Dietary/ SLP consult in place - TF on hold until enteral access re-obtained  Best practice:  Diet: NPO Pain/Anxiety/Delirium protocol (if indicated): continued VAP protocol (if indicated): N/A DVT prophylaxis: Lovenox- per primary team GI prophylaxis: per primary Glucose control: SSI, range 140-180, Q 4 CBG Mobility: as tolerated, PT following Code Status: FULL Family Communication: updated per primary team Disposition: SDU  Critical care time: NA   Cheryll Cockayne Rust-Chester, AGACNP-BC [facility]  11:52 AM 02/06/20  Please see Amion for pager details.

## 2020-02-06 NOTE — TOC Progression Note (Addendum)
Transition of Care [facility]) - Progression Note    Patient Details  Name: Julie Mcguire MRN: [id number] Date of Birth: 02/26/1956  [facility]) CM/SW Contact  Golda Acre, RN Phone Number: 02/06/2020, 7:57 AM  Clinical Narrative:    remians on hfnrb at 35l/min, iv solu medrol, iv amiodarone for a.fib with rvr Plan-possible snf due to deconditioning passar number and fl2 obtained. Insurance is [employer] state health ppo  Expected Discharge Plan: Skilled Nursing Facility Barriers to Discharge: Continued Medical Work up  Expected Discharge Plan and Services Expected Discharge Plan: Skilled Nursing Facility   Discharge Planning Services: CM Consult   Living arrangements for the past 2 months: Single Family Home                                       Social Determinants of Health (SDOH) Interventions    Readmission Risk Interventions No flowsheet data found.

## 2020-02-06 NOTE — Progress Notes (Signed)
ANTICOAGULATION CONSULT NOTE - Consult  Pharmacy Consult for Argatroban Indication: possible HIT  Allergies  Allergen Reactions  . Codeine Nausea And Vomiting  . Heparin     Per HIT testing protocol    Patient Measurements: Height: 5\' 4"  (162.6 cm) Weight: 107.8 kg (237 lb 10.5 oz) IBW/kg (Calculated) : 54.7  Vital Signs: Temp: 99.7 F (37.6 C) (09/23 0600) BP: 126/47 (09/23 0600) Pulse Rate: 79 (09/23 0600)  Labs: Recent Labs    02/04/20 0232 02/04/20 0232 02/05/20 0241 02/05/20 0852 02/05/20 2121 02/06/20 0022 02/06/20 0229 02/06/20 0512  HGB 9.2*   < > 8.0*  --   --  8.1*  --   --   HCT 29.5*  --  26.4*  --   --  25.8*  --   --   PLT 120*  --  96*  --   --  71*  --   --   APTT  --   --   --   --    < > 20* 34 37*  LABPROT  --   --   --  14.0  --   --   --   --   INR  --   --   --  1.1  --   --   --   --   CREATININE 1.05*  --  1.03*  --   --  0.97  --   --    < > = values in this interval not displayed.    Estimated Creatinine Clearance: 73 mL/min (by C-G formula based on SCr of 0.97 mg/dL).   Medical History: Past Medical History:  Diagnosis Date  . Hypertension    Assessment: 64 y/o F admitted with worsening ShOB after previously being hospitalized for COVID-PNA. Hospital course has been prolonged and complicated by encephalopathy, UTI, dysphagia, and AFIB with RVR. Platelets along with hemoglobin have been progressively declining with PLT decreased significantly in the last several days. Patient seems to be outside of the window for HIT but after discussing with Dr. 44, other causes for severe thrombocytopenia seem less likely. 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 Plan is to proceed to order HIT panel and proceed with argatroban.   4T score= 4  Today, 02/06/20  Initial aPTT was <20. Felt that was a spurious result as pt is on  Argatroban, and therefore should have been higher  Ordered another aPTT and this level also  came back low at 20   Repeat level was 34 but was drawn too close to rate change time.  AM follow up level was 37, subtherapeutic  Plt remain low at 71   Hgb 8.1 low but consistent with previous result   No line or bleeding issues per RN. RN reports no line interruptions and that med is infusing properly   Goal of Therapy:  APTT goal 50-90 Monitor platelets by anticoagulation protocol: Yes   Plan:   Increase argatroban to 1 mcg/kg/min  Check aPTT 2 hours after rate change   aPTT q 2 hours until 2 therapeutic levels then daily  Daily CBC    01/16/2020, PharmD, BCPS 02/06/2020 6:35 AM

## 2020-02-06 NOTE — Progress Notes (Signed)
ANTICOAGULATION CONSULT NOTE  Pharmacy Consult for Argatroban Indication: possible HIT  Allergies  Allergen Reactions  . Codeine Nausea And Vomiting  . Heparin     Per HIT testing protocol    Patient Measurements: Height: 5\' 4"  (162.6 cm) Weight: 107.8 kg (237 lb 10.5 oz) IBW/kg (Calculated) : 54.7  Vital Signs: Temp: 99.5 F (37.5 C) (09/23 1100) Temp Source: Bladder (09/23 0800) BP: 132/72 (09/23 1100) Pulse Rate: 99 (09/23 1100)  Labs: Recent Labs    02/04/20 0232 02/04/20 0232 02/05/20 0241 02/05/20 0852 02/05/20 2121 02/06/20 0022 02/06/20 0022 02/06/20 0229 02/06/20 0512 02/06/20 1009  HGB 9.2*   < > 8.0*  --   --  8.1*  --   --   --   --   HCT 29.5*  --  26.4*  --   --  25.8*  --   --   --   --   PLT 120*  --  96*  --   --  71*  --   --   --   --   APTT  --   --   --   --    < > 20*   < > 34 37* 43*  LABPROT  --   --   --  14.0  --   --   --   --   --   --   INR  --   --   --  1.1  --   --   --   --   --   --   CREATININE 1.05*  --  1.03*  --   --  0.97  --   --   --   --    < > = values in this interval not displayed.    Estimated Creatinine Clearance: 73 mL/min (by C-G formula based on SCr of 0.97 mg/dL).   Assessment: 64 y/o F admitted with worsening ShOB after previously being hospitalized for COVID-PNA. Hospital course has been prolonged and complicated by encephalopathy, UTI, dysphagia, and AFIB with RVR. Platelets along with hemoglobin have been progressively declining with PLT decreased significantly in the last several days. Patient seems to be outside of the window for HIT but after discussing with Dr. 44, other causes for severe thrombocytopenia seem less likely. 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 Plan is to proceed to order HIT panel and proceed with argatroban.   4T score= 4  Today, 02/06/20   APTT = 0.43 seconds after rate increased to 1 mcg/kg/min - still below goal of 50-90 seconds  Plt remain low at 71    Hgb 8.1 low but consistent with previous result   No line or bleeding issues per RN. RN reports no line interruptions and that med is infusing properly   Goal of Therapy:  APTT goal 50-90 Monitor platelets by anticoagulation protocol: Yes   Plan:   Increase argatroban by 20%  to 1.2 mcg/kg/min  Check aPTT 2 hours after rate change   aPTT q 2 hours until 2 therapeutic levels then daily  Daily CBC   F/u HIT panel in progress from 9/22   10/22, Pharm.D 02/06/2020 11:21 AM

## 2020-02-06 NOTE — NC FL2 (Signed)
[place] MEDICAID [id number] LEVEL OF CARE SCREENING TOOL     IDENTIFICATION  Patient Name: Julie Mcguire Birthdate: [birth year] Sex: female Admission Date (Current Location): 02/11/20  [facility] [place] Number:  Producer, television/film/video and Address:  [facility],  [address] [place], [place] [address]      Provider Number: [id number]  Attending Physician Name and Address:  Rolly Salter, MD  Relative Name and Phone Number:       Current Level of Care: Hospital Recommended Level of Care: Skilled Nursing Facility Prior Approval Number:    Date Approved/Denied:   PASRR Number: [id number]  Discharge Plan: SNF    Current Diagnoses: Patient Active Problem List   Diagnosis Date Noted  . Thrombocytopenia (HCC) 02/05/2020  . Elevated BUN 02/05/2020  . Anemia 02/05/2020  . Delirium 02/05/2020  . Hypernatremia 02/05/2020  . Dysphagia 02/05/2020  . Atrial fibrillation with RVR (HCC) 02/04/2020  . Acute lower UTI 02/03/2020  . Sinus tachycardia 02/02/2020  . Bacterial pneumonia 01/31/2020  . Transaminitis 01/31/2020  . Secondary bacterial pneumonia 01/29/2020  . Hypokalemia 01/29/2020  . Toxic metabolic encephalopathy 01/29/2020  . COVID-19   . SIRS (systemic inflammatory response syndrome) (HCC)   . Goals of care, counseling/discussion   . Palliative care by specialist   . DNR (do not resuscitate) discussion   . Acute respiratory failure with hypoxia (HCC) Feb 11, 2020  . Acute on chronic respiratory failure with hypoxia (HCC) 01/07/2020  . Pneumonia due to COVID-19 virus 01/07/2020  . Essential hypertension 01/07/2020  . ARF (acute renal failure) (HCC) 01/07/2020  . Obesity, Class III, BMI 40-49.9 (morbid obesity) (HCC) 01/07/2020  . Hyponatremia 01/07/2020  . Unilateral primary osteoarthritis, left knee 05/30/2018  . Unilateral primary osteoarthritis, right knee 05/30/2018  . 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 Unilateral primary  osteoarthritis, left hip 08/30/2017    Orientation RESPIRATION BLADDER Height & Weight     Self, Time, Situation, Place  Normal Continent Weight: 107.8 kg Height:  5\' 4"  (162.6 cm)  BEHAVIORAL SYMPTOMS/MOOD NEUROLOGICAL BOWEL NUTRITION STATUS      Continent Diet (regular)  AMBULATORY STATUS COMMUNICATION OF NEEDS Skin   Extensive Assist Verbally Normal                       Personal Care Assistance Level of Assistance  Bathing, Feeding, Dressing Bathing Assistance: Limited assistance Feeding assistance: Limited assistance Dressing Assistance: Limited assistance     Functional Limitations Info  Sight, Hearing, Speech Sight Info: Adequate Hearing Info: Adequate Speech Info: Adequate    SPECIAL CARE FACTORS FREQUENCY  PT (By licensed PT), OT (By licensed OT)     PT Frequency: 5 x weekly OT Frequency: 5 x weekly            Contractures Contractures Info: Not present    Additional Factors Info  Code Status Code Status Info: full             Current Medications (02/06/2020):  This is the current hospital active medication list Current Facility-Administered Medications  Medication Dose Route Frequency Provider Last Rate Last Admin  . 0.9 %  sodium chloride infusion   Intravenous PRN 01/22/2020, MD 10 mL/hr at 02/06/20 0204 Rate Verify at 02/06/20 0204  . 0.9 %  sodium chloride infusion  250 mL Intravenous PRN 0205, MD      . acetaminophen (TYLENOL) tablet 650 mg  650 mg Oral Q6H PRN Rolly Salter,  Eric J, DO   650 mg at 02/03/20 1419  . albuterol (VENTOLIN HFA) 108 (90 Base) MCG/ACT inhaler 2 puff  2 puff Inhalation Q4H PRN Lonia Blood, MD      . amiodarone (NEXTERONE PREMIX) 360-4.14 MG/200ML-% (1.8 mg/mL) IV infusion  30 mg/hr Intravenous Continuous Rust-Chester, Britton L, NP 16.67 mL/hr at 02/06/20 0204 30 mg/hr at 02/06/20 0204  . argatroban 1 mg/mL infusion  1 mcg/kg/min Intravenous Continuous Adalberto Cole, RPH 6.45 mL/hr at 02/06/20  0649 1 mcg/kg/min at 02/06/20 0649  . chlorhexidine (PERIDEX) 0.12 % solution 15 mL  15 mL Mouth Rinse BID Lonia Blood, MD   15 mL at 02/05/20 0951  . Chlorhexidine Gluconate Cloth 2 % PADS 6 each  6 each Topical Daily [place], Eric J, DO   6 each at 02/05/20 0233  . diclofenac Sodium (VOLTAREN) 1 % topical gel 2 g  2 g Topical QID PRN Lonia Blood, MD   2 g at 02/02/20 2151  . diltiazem (CARDIZEM) 125 mg in dextrose 5% 125 mL (1 mg/mL) infusion  5-15 mg/hr Intravenous Titrated Drema Dallas, MD 15 mL/hr at 02/06/20 0204 15 mg/hr at 02/06/20 0204  . Gerhardt's butt cream   Topical BID Drema Dallas, MD   Given at 02/05/20 2104  . haloperidol lactate (HALDOL) injection 2 mg  2 mg Intravenous Q6H PRN Rolly Salter, MD   2 mg at 02/05/20 1240  . lip balm (CARMEX) ointment   Topical PRN [place], Alvira Philips, DO      . LORazepam (ATIVAN) injection 2-4 mg  2-4 mg Intravenous Q4H PRN Rolly Salter, MD   2 mg at 02/06/20 0221  . MEDLINE mouth rinse  15 mL Mouth Rinse BID Lonia Blood, MD   15 mL at 02/05/20 2104  . methylPREDNISolone sodium succinate (SOLU-MEDROL) 40 mg/mL injection 20 mg  20 mg Intravenous Q24H Mannam, Praveen, MD   20 mg at 02/06/20 0539  . metoprolol tartrate (LOPRESSOR) injection 7.5 mg  7.5 mg Intravenous Q6H Kroeger, Krista M., PA-C   7.5 mg at 02/06/20 0539  . morphine 2 MG/ML injection 1 mg  1 mg Intravenous Q4H PRN Drema Dallas, MD   1 mg at 02/05/20 1259  . nystatin (MYCOSTATIN/NYSTOP) topical powder   Topical BID Lonia Blood, MD   Given at 02/05/20 2104  . ondansetron (ZOFRAN) injection 4 mg  4 mg Intravenous Q8H PRN Marikay Alar, FNP   4 mg at 02/03/20 2015  . sodium chloride flush (NS) 0.9 % injection 3 mL  3 mL Intravenous Q12H Lynden Oxford M, MD      . sodium chloride flush (NS) 0.9 % injection 3 mL  3 mL Intravenous PRN Rolly Salter, MD         Discharge Medications: Please see discharge summary for a list of discharge  medications.  Relevant Imaging Results:  Relevant Lab Results:   Additional Information [id number]  Golda Acre, RN

## 2020-02-06 NOTE — Progress Notes (Signed)
Replaced water bottle on high flow nasal cannula 02 system- uneventful.

## 2020-02-06 NOTE — Progress Notes (Addendum)
[facility] Progress Note  Patient: Julie Mcguire    MLY:[id number]  DOA: 12/15/2019     Date of Service: the patient was seen and examined on 02/06/2020  Brief hospital course: Past medical history of hypertension and obesity presents with worsening shortness of breath and hypoxia after recently being hospitalized and discharged for COVID-19 pneumonia.  Hospital course complicated by ongoing encephalopathy, staph epi UTI infection, dysphagia and A. fib with RVR. Currently plan is continue current treatment for RVR, sepsis as well as encephalopathy.  Assessment and Plan: 1.  COVID-19 pneumonia Acute hypoxic respiratory failure, POA Sepsis POA secondary to Covid pneumonia Acute metabolic encephalopathy secondary to staph epi UTI hospital induced delirium Treated with baricitinib and remdesivir. Continue to remain hypoxic for now. Continue steroids.  CT angio chest negative for any acute pulmonary embolism. Last chest x-ray on 02/04/2020 shows diffuse severe bilateral pulmonary infiltrate. Work-up for secondary infection with blood cultures on 02/02/2020 so far negative. Urine culture on 02/01/2020 + for Staph epidermidis. Low-grade temperature on 02/05/2020. Started on IV vancomycin for staph epi on 02/03/2020 for 3 days. CRP improving. Continue monitoring closely may require repeat culture and broad-spectrum antibiotic if remains febrile.  2.  Acute metabolic encephalopathy Presentation with delirium. Require Precedex. Off of Precedex remains agitated requiring restraint for now. Patient was on Seroquel via NG tube. NG tube was removed and therefore patient was started on IV Ativan. IV Haldol on 02/05/2020 was introduced although per RN patient had paradoxical effect with agitation. Currently continuing Ativan and Haldol as needed. EEG negative for any acute seizures. Ammonia, TSH level normal.  Mildly low B12 level supplemented with subcutaneous injection.  No focal deficit on  exam. CT head on 01/27/2020 - for any acute abnormality. At risk for poor outcome and need for intubation for airway protection if remains agitated.  3.  A. fib with RVR, new onset Cardiology consulted. Adenosine was given which slowed down the heart rate and atrial flutter was evident. Started on amiodarone drip on 02/04/2020. Along with Cardizem and IV metoprolol. Cardiology recommending therapeutic heparin although in the setting of thrombocytopenia at risk for bleeding. Currently on argatroban. Per cardiology will require 3 weeks of anticoagulation before considering cardioversion. Monitor for now.  4.  Dysphagia In the setting of acute encephalopathy. Anticipating improvement in swallowing ability once her encephalopathy as well as respiratory distress improves. Failed bedside swallowing evaluation. Bedside NG tube placement failed. IR placement of NG tube was performed, tube feedings were started although unfortunately NG tube was pulled out by patient. NG tube reinserted on 02/06/2020. Currently not ready for use unless x-ray abdomen verifies placement as the NG tube is not in place for now. Restraints to protect the NG tube.  5.  Hypernatremia, currently resolved. In the setting of poor p.o. intake. Corrected with free water. Monitor.  6.  Elevated BUN In the setting of steroid use. Monitor for now.  7.  Steroid-induced leukocytosis Monitor for now.  8.  Acute thrombocytopenia Macrocytic anemia No active bleeding reported by RN. Platelet count significantly reducing.  Currently 96. Citrated platelet unable to be measured due to clumping. RBC morphology shows no evidence of schistocytes. INR normal. Fibrinogen level elevated. Haptoglobin currently pending. White clumping noted on the specimen collected on 02/05/2020 no clumping has been reported on prior specimens for last 4 days. Platelet at peak 300. We will initiate HIT work-up. Hematology consulted.  Appreciate  their assistance.  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  9.  Obesity, morbid Body mass index is 40.79 kg/m.  Currently unable to take anything p.o. We will discuss dietary consultation once able to follow commands. Currently n.p.o.  10.  Goals of care conversation. Patient with severe COVID-19 illness with secondary infections, ongoing encephalopathy with multiorgan failure including A. fib with RVR currently still severely hypoxic requiring nonrebreather as well as 15 g of oxygen via high flow nasal cannula. Husband at bedside.  Discussed in detail on 02/06/2020 regarding patient's current progress for last 22 days. Also discussed in detail regarding poor future prognosis should the patient has a cardiac arrest and quality of life. Husband currently agrees that the quality of life may significantly suffer but currently wants to process the information that he has been presented with.  Diet: NPO DVT Prophylaxis: Therapeutic Anticoagulation with direct thrombin inhibitor.   Advance goals of care discussion: Full code  Family Communication: family was present at bedside, at the time of interview.   Disposition:  Status is: Inpatient  Remains inpatient appropriate because:Hemodynamically unstable and Inpatient level of care appropriate due to severity of illness   Dispo:  Patient From:   Home  Planned Disposition:   To be determined  Expected discharge date: Not clear  Medically stable for discharge:   No   Subjective: Patient continues to repeat only 1 line, cut it out.  Unable to follow commands.  Heart rate remains elevated no other acute events.  Physical Exam:  General: Appear in marked distress, no Rash; Oral Mucosa Clear, moist. no Abnormal Neck Mass Or lumps, Conjunctiva normal  Cardiovascular: S1 and S2 Present, no Murmur, Respiratory: increased respiratory effort, Bilateral Air entry present and  bilateral  Crackles, Occasional  wheezes Abdomen: Bowel Sound present, Soft and difficult to assess tenderness Extremities: trace Pedal edema Neurology: lethargic and not oriented to time, place, and person affect emotionally labile. no new focal deficit Gait not checked due to patient safety concerns  Vitals:   02/06/20 1300 02/06/20 1400 02/06/20 1500 02/06/20 1600  BP: (!) 143/54 131/60 (!) 143/90 (!) 147/82  Pulse: 100 82 96 92  Resp: (!) 25 (!) 28 (!) 32 (!) 22  Temp: 99.5 F (37.5 C) 99.5 F (37.5 C) 99.5 F (37.5 C) 99.3 F (37.4 C)  TempSrc:      SpO2: 90% (!) 89% (!) 84% 92%  Weight:      Height:        Intake/Output Summary (Last 24 hours) at 02/06/2020 1757 Last data filed at 02/06/2020 1300 Gross per 24 hour  Intake 1660.2 ml  Output 425 ml  Net 1235.2 ml   Filed Weights   02/02/20 0500 02/05/20 0607 02/06/20 0500  Weight: 108.7 kg 107.5 kg 107.8 kg    Data Reviewed: I have personally reviewed and interpreted daily labs, tele strips, imagings as discussed above. I reviewed all nursing notes, pharmacy notes, vitals, pertinent old records I have discussed plan of care as described above with RN and patient/family.  CBC: Recent Labs  Lab 02/02/20 0043 02/03/20 0250 02/04/20 0232 02/05/20 0241 02/06/20 0022  WBC 22.4* 16.6* 23.4* 15.2* 15.5*  NEUTROABS 18.4* 13.7* 19.7* 12.8* 13.1*  HGB 10.5* 9.2* 9.2* 8.0* 8.1*  HCT 32.6* 30.1* 29.5* 26.4* 25.8*  MCV 95.3 98.4 99.0 100.8* 98.1  PLT 128* 113* 120* 96* 71*   Basic Metabolic Panel: Recent Labs  Lab 02/02/20 0043 02/03/20 0250 02/04/20 0232 02/05/20 0241 02/06/20 0022  NA  144 147* 146* 144 146*  K 3.7 4.1 4.1 3.5 3.5  CL 107 111 108 110 110  CO2 27 27 26 23 26   GLUCOSE 102* 137* 173* 116* 103*  BUN 37* 34* 57* 52* 36*  CREATININE 0.94 0.81 1.05* 1.03* 0.97  CALCIUM 8.6* 8.9 9.3 8.5* 8.7*  MG 2.0 2.0 2.1 2.1 2.1  PHOS 3.3 3.5 3.9 4.0 3.3    Studies: DG INTRO LONG GI TUBE  Result Date:  02/06/2020 CLINICAL DATA:  Dysphagia.  Food tube placement. EXAM: FEEDING TUBE PLACEMENT UNDER FLUOROSCOPIC GUIDANCE FLUOROSCOPY TIME:  Fluoroscopy Time:  2 minutes 0 seconds Radiation Exposure Index (if provided by the fluoroscopic device): 32.7 mGy Number of Acquired Spot Images: 0 COMPARISON:  01/30/2020 FINDINGS: With the patient in supine position on the fluoroscopy table, a Dobbhoff feeding tube was inserted into the right naris, and was subsequently passed into the esophagus. The feeding tube was advanced under fluoroscopic guidance, and was coiled within a hiatal hernia, as happened during previous feeding tube placement . The feeding tube could not be advanced into the distal stomach or duodenum despite multiple attempts. The patient tolerated the procedure well, and was subsequently returned to the ICU. IMPRESSION: Placement of Dobbhoff feeding tube under fluoroscopic guidance. The feeding tube was coiled within a hiatal hernia as done previously, but could not be advanced into the distal stomach or duodenum. Electronically Signed   By: 02/01/2020 M.D.   On: 02/06/2020 15:51    Scheduled Meds: . chlorhexidine  15 mL Mouth Rinse BID  . Chlorhexidine Gluconate Cloth  6 each Topical Daily  . Gerhardt's butt cream   Topical BID  . mouth rinse  15 mL Mouth Rinse BID  . methylPREDNISolone (SOLU-MEDROL) injection  20 mg Intravenous Q24H  . metoprolol tartrate  7.5 mg Intravenous Q6H  . nystatin   Topical BID  . sodium chloride flush  3 mL Intravenous Q12H   Continuous Infusions: . sodium chloride Stopped (02/06/20 0506)  . sodium chloride    . amiodarone 30 mg/hr (02/06/20 1300)  . argatroban 1.6 mcg/kg/min (02/06/20 1707)  . diltiazem (CARDIZEM) infusion 15 mg/hr (02/06/20 1300)   PRN Meds: sodium chloride, sodium chloride, acetaminophen, albuterol, diclofenac Sodium, haloperidol lactate, lip balm, LORazepam, morphine injection, ondansetron (ZOFRAN) IV, sodium chloride flush  Time spent:  35 minutes  Author: 02/07/2020, MD [facility] 02/06/2020 5:57 PM  To reach On-call, see care teams to locate the attending and reach out via [email or web address]. Between 7PM-7AM, please contact night-coverage If you still have difficulty reaching the attending provider, please page the [facility] (Director on Call) for [facility] on amion for assistance.

## 2020-02-06 NOTE — Progress Notes (Signed)
ANTICOAGULATION CONSULT NOTE  Pharmacy Consult for Argatroban >> IV heparin Indication: Afib, HIT r/o  Allergies  Allergen Reactions  . Codeine Nausea And Vomiting    Patient Measurements: Height: 5\' 4"  (162.6 cm) Weight: 107.8 kg (237 lb 10.5 oz) IBW/kg (Calculated) : 54.7  Vital Signs: Temp: 99.5 F (37.5 C) (09/23 1952) Temp Source: Bladder (09/23 1200) BP: 160/70 (09/23 1900) Pulse Rate: 85 (09/23 1952)  Labs: Recent Labs    02/04/20 0232 02/04/20 0232 02/05/20 0241 02/05/20 0852 02/05/20 2121 02/06/20 0022 02/06/20 0229 02/06/20 0512 02/06/20 1009 02/06/20 1316  HGB 9.2*   < > 8.0*  --   --  8.1*  --   --   --   --   HCT 29.5*  --  26.4*  --   --  25.8*  --   --   --   --   PLT 120*  --  96*  --   --  71*  --   --   --   --   APTT  --   --   --   --    < > 20*   < > 37* 43* 25  LABPROT  --   --   --  14.0  --   --   --   --   --   --   INR  --   --   --  1.1  --   --   --   --   --   --   CREATININE 1.05*  --  1.03*  --   --  0.97  --   --   --   --    < > = values in this interval not displayed.    Estimated Creatinine Clearance: 73 mL/min (by C-G formula based on SCr of 0.97 mg/dL).   Assessment: 64 y/o F admitted with worsening ShOB after previously being hospitalized for COVID-PNA. Hospital course has been prolonged and complicated by encephalopathy, UTI, dysphagia, and AFIB with RVR. Platelets along with hemoglobin have been progressively declining with PLT decreased significantly in the last several days. Patient seems to be outside of the window for HIT but after discussing with Dr. 44, other causes for severe thrombocytopenia seem less likely. 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 Plan is to proceed to order HIT panel and proceed with argatroban.   4T score= 4  Today, 02/06/20   HIT antibody negative at 0.094 - appears Onc and [facility] have fairly low suspicion of true HIT given clinical picture  Hgb low but stable; Plt  continue to drop  No line or bleeding issues per RN  Discussed HIT Ab result with mid-level provider; will transition back to heparin with conservative dosing given bleeding risk   Goal of Therapy:  Anti-Xa level 0.3-0.5 Monitor platelets by anticoagulation protocol: Yes   Plan:   Stop argatroban  Begin heparin infusion at 950 units/hr, no bolus  Check heparin level in 8 hr  Daily CBC and heparin level  Per Cards, once need for further procedures ruled out, patient can transition to Eliquis in preparation for cardioversion  02/12/2020, PharmD, BCPS [phone] 02/06/2020, 8:59 PM

## 2020-02-06 NOTE — Progress Notes (Signed)
ANTICOAGULATION CONSULT NOTE  Pharmacy Consult for Argatroban Indication: possible HIT  Allergies  Allergen Reactions  . Codeine Nausea And Vomiting  . Heparin     Per HIT testing protocol    Patient Measurements: Height: 5\' 4"  (162.6 cm) Weight: 107.8 kg (237 lb 10.5 oz) IBW/kg (Calculated) : 54.7  Vital Signs: Temp: 99.5 F (37.5 C) (09/23 1300) Temp Source: Bladder (09/23 1200) BP: 143/54 (09/23 1300) Pulse Rate: 100 (09/23 1300)  Labs: Recent Labs    02/04/20 0232 02/04/20 0232 02/05/20 0241 02/05/20 0852 02/05/20 2121 02/06/20 0022 02/06/20 0229 02/06/20 0512 02/06/20 1009 02/06/20 1316  HGB 9.2*   < > 8.0*  --   --  8.1*  --   --   --   --   HCT 29.5*  --  26.4*  --   --  25.8*  --   --   --   --   PLT 120*  --  96*  --   --  71*  --   --   --   --   APTT  --   --   --   --    < > 20*   < > 37* 43* 25  LABPROT  --   --   --  14.0  --   --   --   --   --   --   INR  --   --   --  1.1  --   --   --   --   --   --   CREATININE 1.05*  --  1.03*  --   --  0.97  --   --   --   --    < > = values in this interval not displayed.    Estimated Creatinine Clearance: 73 mL/min (by C-G formula based on SCr of 0.97 mg/dL).   Assessment: 64 y/o F admitted with worsening ShOB after previously being hospitalized for COVID-PNA. Hospital course has been prolonged and complicated by encephalopathy, UTI, dysphagia, and AFIB with RVR. Platelets along with hemoglobin have been progressively declining with PLT decreased significantly in the last several days. Patient seems to be outside of the window for HIT but after discussing with Dr. 44, other causes for severe thrombocytopenia seem less likely. 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 Plan is to proceed to order HIT panel and proceed with argatroban.   4T score= 4  Today, 02/06/20  APTT down to 25 seconds after rate increased to 1.2 mcg/kg/min -  below goal of 50-90 seconds  Plt remain low at 71   Hgb 8.1 low but consistent with previous result  No line or bleeding issues per RN. RN reports no line interruptions and that med is infusing properly   Goal of Therapy:  APTT goal 50-90 Monitor platelets by anticoagulation protocol: Yes   Plan:   Increase argatroban by 30%  to 1.6 mcg/kg/min  Check aPTT 2 hours after rate change   aPTT q 2 hours until 2 therapeutic levels then daily  Daily CBC   F/u HIT panel in progress from 9/22 - may be able to transition to heparin drip if negative  10/22, Pharm.D 02/06/2020 2:26 PM

## 2020-02-07 ENCOUNTER — Inpatient Hospital Stay: Payer: Self-pay

## 2020-02-07 ENCOUNTER — Inpatient Hospital Stay (HOSPITAL_COMMUNITY): Payer: BC Managed Care – PPO

## 2020-02-07 LAB — COMPREHENSIVE METABOLIC PANEL
ALT: 60 U/L — ABNORMAL HIGH (ref 0–44)
AST: 28 U/L (ref 15–41)
Albumin: 2.5 g/dL — ABNORMAL LOW (ref 3.5–5.0)
Alkaline Phosphatase: 59 U/L (ref 38–126)
Anion gap: 10 (ref 5–15)
BUN: 29 mg/dL — ABNORMAL HIGH (ref 8–23)
CO2: 26 mmol/L (ref 22–32)
Calcium: 8.8 mg/dL — ABNORMAL LOW (ref 8.9–10.3)
Chloride: 111 mmol/L (ref 98–111)
Creatinine, Ser: 0.93 mg/dL (ref 0.44–1.00)
GFR calc Af Amer: >60 mL/min (ref >60)
GFR calc non Af Amer: >60 mL/min (ref >60)
Glucose, Bld: 112 mg/dL — ABNORMAL HIGH (ref 70–99)
Potassium: 3.2 mmol/L — ABNORMAL LOW (ref 3.5–5.1)
Sodium: 147 mmol/L — ABNORMAL HIGH (ref 135–145)
Total Bilirubin: 0.6 mg/dL (ref 0.3–1.2)
Total Protein: 5.6 g/dL — ABNORMAL LOW (ref 6.5–8.1)

## 2020-02-07 LAB — BLOOD GAS, ARTERIAL
Acid-Base Excess: 2.4 mmol/L — ABNORMAL HIGH (ref 0.0–2.0)
Bicarbonate: 27.3 mmol/L (ref 20.0–28.0)
Drawn by: 270211
FIO2: 100
O2 Content: 70 L/min
O2 Saturation: 88.5 %
Patient temperature: 98.7
pCO2 arterial: 47.9 mmHg (ref 32.0–48.0)
pH, Arterial: 7.375 (ref 7.350–7.450)
pO2, Arterial: 62.3 mmHg — ABNORMAL LOW (ref 83.0–108.0)

## 2020-02-07 LAB — GLUCOSE, CAPILLARY
Glucose-Capillary: 118 mg/dL — ABNORMAL HIGH (ref 70–99)
Glucose-Capillary: 182 mg/dL — ABNORMAL HIGH (ref 70–99)
Glucose-Capillary: 106 mg/dL — ABNORMAL HIGH (ref 70–99)

## 2020-02-07 LAB — CBC WITH DIFFERENTIAL/PLATELET
Abs Immature Granulocytes: 0 10*3/uL (ref 0.00–0.07)
Band Neutrophils: 4 %
Basophils Absolute: 0 10*3/uL (ref 0.0–0.1)
Basophils Relative: 0 %
Eosinophils Absolute: 0 10*3/uL (ref 0.0–0.5)
Eosinophils Relative: 0 %
HCT: 25.7 % — ABNORMAL LOW (ref 36.0–46.0)
Hemoglobin: 8.1 g/dL — ABNORMAL LOW (ref 12.0–15.0)
Lymphocytes Relative: 9 %
Lymphs Abs: 1.3 10*3/uL (ref 0.7–4.0)
MCH: 30.8 pg (ref 26.0–34.0)
MCHC: 31.5 g/dL (ref 30.0–36.0)
MCV: 97.7 fL (ref 80.0–100.0)
Monocytes Absolute: 0.4 10*3/uL (ref 0.1–1.0)
Monocytes Relative: 3 %
Neutro Abs: 13.1 10*3/uL — ABNORMAL HIGH (ref 1.7–7.7)
Neutrophils Relative %: 84 %
Platelets: 82 10*3/uL — ABNORMAL LOW (ref 150–400)
RBC: 2.63 MIL/uL — ABNORMAL LOW (ref 3.87–5.11)
RDW: 16.6 % — ABNORMAL HIGH (ref 11.5–15.5)
WBC: 14.9 10*3/uL — ABNORMAL HIGH (ref 4.0–10.5)
nRBC: 1.3 % — ABNORMAL HIGH (ref 0.0–0.2)

## 2020-02-07 LAB — PROCALCITONIN: Procalcitonin: 0.23 ng/mL

## 2020-02-07 LAB — HEPARIN LEVEL (UNFRACTIONATED)
Heparin Unfractionated: 0.31 IU/mL (ref 0.30–0.70)
Heparin Unfractionated: 0.3 IU/mL (ref 0.30–0.70)

## 2020-02-07 LAB — MAGNESIUM: Magnesium: 2.2 mg/dL (ref 1.7–2.4)

## 2020-02-07 LAB — C-REACTIVE PROTEIN: CRP: 3 mg/dL — ABNORMAL HIGH (ref <1.0)

## 2020-02-07 LAB — D-DIMER, QUANTITATIVE: D-Dimer, Quant: 1.28 ug/mL-FEU — ABNORMAL HIGH (ref 0.00–0.50)

## 2020-02-07 LAB — PHOSPHORUS: Phosphorus: 2.7 mg/dL (ref 2.5–4.6)

## 2020-02-07 LAB — FERRITIN: Ferritin: 300 ng/mL (ref 11–307)

## 2020-02-07 MED ORDER — OSMOLITE 1.5 CAL PO LIQD
1000.0000 mL | ORAL | Status: DC
  Filled 2020-02-07: qty 1000

## 2020-02-07 MED ORDER — SODIUM CHLORIDE 0.9% FLUSH
10.0000 mL | Freq: Two times a day (BID) | INTRAVENOUS | Status: DC
  Administered 2020-02-07 – 2020-02-08 (×2): 10 mL

## 2020-02-07 MED ORDER — FUROSEMIDE 10 MG/ML IJ SOLN
40.0000 mg | Freq: Two times a day (BID) | INTRAMUSCULAR | Status: DC
  Administered 2020-02-07 – 2020-02-08 (×3): 40 mg via INTRAVENOUS
  Filled 2020-02-07 (×3): qty 4

## 2020-02-07 MED ORDER — NITROGLYCERIN 0.4 MG/HR TD PT24
0.4000 mg | MEDICATED_PATCH | Freq: Every day | TRANSDERMAL | Status: DC
  Administered 2020-02-07: 0.4 mg via TRANSDERMAL
  Filled 2020-02-07: qty 1

## 2020-02-07 MED ORDER — QUETIAPINE FUMARATE 50 MG PO TABS: 25.0000 mg | ORAL_TABLET | Freq: Every day | ORAL | Status: DC

## 2020-02-07 MED ORDER — POTASSIUM CHLORIDE 10 MEQ/100ML IV SOLN
10.0000 meq | INTRAVENOUS | Status: AC
  Administered 2020-02-07 (×5): 10 meq via INTRAVENOUS
  Filled 2020-02-07: qty 100

## 2020-02-07 MED ORDER — SODIUM CHLORIDE 0.9% FLUSH: 10.0000 mL | INTRAVENOUS | Status: DC | PRN

## 2020-02-07 MED ORDER — FREE WATER: 100.0000 mL | Freq: Four times a day (QID) | Status: DC

## 2020-02-07 NOTE — Progress Notes (Signed)
Peripherally Inserted Central Catheter Placement  The IV Nurse has discussed with the patient and/or persons authorized to consent for the patient, the purpose of this procedure and the potential benefits and risks involved with this procedure.  The benefits include less needle sticks, lab draws from the catheter, and the patient may be discharged home with the catheter. Risks include, but not limited to, infection, bleeding, blood clot (thrombus formation), and puncture of an artery; nerve damage and irregular heartbeat and possibility to perform a PICC exchange if needed/ordered by physician.  Alternatives to this procedure were also discussed.  Bard Power PICC patient education guide, fact sheet on infection prevention and patient information card has been provided to patient /or left at bedside.    PICC Placement Documentation  PICC Double Lumen 02/07/20 PICC Right Brachial 40 cm 0 cm (Active)  Indication for Insertion or Continuance of Line Poor Vasculature-patient has had multiple peripheral attempts or PIVs lasting less than 24 hours 02/07/20 1804  Exposed Catheter (cm) 0 cm 02/07/20 1804  Site Assessment Clean;Dry;Intact 02/07/20 1804  Lumen #1 Status Flushed;Saline locked;Blood return noted 02/07/20 1804  Lumen #2 Status Flushed;Saline locked;Blood return noted 02/07/20 1804  Dressing Type Transparent;Securing device 02/07/20 1804  Dressing Status Clean;Dry;Intact 02/07/20 1804  Antimicrobial disc in place? Yes 02/07/20 1804  Safety Lock Not Applicable 02/07/20 1804  Dressing Intervention New dressing 02/07/20 1804  Dressing Change Due 02/14/20 02/07/20 1804       Annett Fabian 02/07/2020, 6:06 PM

## 2020-02-07 NOTE — Progress Notes (Signed)
[facility] Progress Note  Patient: Julie Mcguire    HLK:[id number]  DOA: 01/01/2020     Date of Service: the patient was seen and examined on 02/07/2020  Brief hospital course: Past medical history of hypertension and obesity presents with worsening shortness of breath and hypoxia after recently being hospitalized and discharged for COVID-19 pneumonia.  Hospital course complicated by ongoing encephalopathy, staph epi UTI infection, dysphagia and A. fib with RVR.  8/24-8/27 Admit with COVID 8/31 readmission with worsening shortness of breath 9/14 PCCM consulted for evaluation of encephalopathy and hypoxic respiratory failure 9/15 Precedex started 9/16 HHFNC + NRB 9/19 precedex off 9/21 new AF RVR, diltiazem, amiodarone gtt started 9/22 worsening thrombocytopenia, hematology consult.  HIT panel negative Currently plan is supportive care for persistent encephalopathy and severe hypoxia.  Assessment and Plan: 1.  COVID-19 pneumonia Acute hypoxic respiratory failure, POA Sepsis POA secondary to Covid pneumonia Acute metabolic encephalopathy secondary to staph epi UTI hospital induced delirium Acute diastolic CHF Treated with baricitinib and remdesivir. Continue to remain hypoxic for now. Continue steroids.  CT angio chest negative for any acute pulmonary embolism. Last chest x-ray on 02/04/2020 shows diffuse severe bilateral pulmonary infiltrate. Work-up for secondary infection with blood cultures on 02/02/2020 so far negative. Urine culture on 02/01/2020 + for Staph epidermidis. Low-grade temperature on 02/05/2020. Started on IV vancomycin for staph epi on 02/03/2020 for 3 days. CRP improving. We will give IV Lasix and monitor improvement.  2.  Acute metabolic encephalopathy Presentation with delirium. Require Precedex. Off of Precedex remains agitated requiring restraint for now. Patient was on Seroquel via NG tube. NG tube was removed and therefore patient was started on IV  Ativan. IV Haldol on 02/05/2020 was introduced although per RN patient had paradoxical effect with agitation. Currently continuing Ativan and Haldol as needed. EEG negative for any acute seizures. Ammonia, TSH level normal.  Mildly low B12 level supplemented with subcutaneous injection.  No focal deficit on exam. CT head on 01/27/2020 - for any acute abnormality. At risk for poor outcome and need for intubation for airway protection if remains agitated.  3.  A. fib with RVR, new onset Cardiology consulted. Adenosine was given which slowed down the heart rate and atrial flutter was evident. Started on amiodarone drip on 02/04/2020. Along with Cardizem and IV metoprolol. Cardiology recommending therapeutic heparin although in the setting of thrombocytopenia at risk for bleeding. Per cardiology will require 3 weeks of anticoagulation before considering cardioversion. Monitor for now. Currently on heparin  4.  Dysphagia In the setting of acute encephalopathy. Anticipating improvement in swallowing ability once her encephalopathy as well as respiratory distress improves. Failed bedside swallowing evaluation. Bedside NG tube placement failed. IR placement of NG tube was performed, tube feedings were started although unfortunately NG tube was pulled out by patient. NG tube reinserted on 02/06/2020. Currently not ready for use unless x-ray abdomen verifies placement as the NG tube is not in place for now. Repeat x-ray on 02/07/2020 shows NG tube in proximal stomach in the hiatal hernia area but unfortunately it is still not able to be flushed and therefore not ready for use Restraints to protect the NG tube.  5.  Hypernatremia, currently resolved. In the setting of poor p.o. intake. Corrected with free water. If unable to use NG tube again we will have to start her on D5.  Monitor.  6.  Elevated BUN In the setting of steroid use. Monitor for now.  7.  Steroid-induced  leukocytosis Monitor for  now.  8.  Acute thrombocytopenia Macrocytic anemia No active bleeding reported by RN. Platelet count significantly reducing.  Currently 96. Citrated platelet unable to be measured due to clumping. RBC morphology shows no evidence of schistocytes. INR normal. Fibrinogen level elevated. Haptoglobin currently pending. White clumping noted on the specimen collected on 02/05/2020 no clumping has been reported on prior specimens for last 4 days. Platelet at peak 300. Hematology consulted.  Appreciate their assistance.   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 HIT panel also negative. Discontinue argatroban and patient is currently on heparin. Once NG tube is usable we will transition to Eliquis as soon as possible  9.  Obesity, morbid Body mass index is 40.38 kg/m.  Currently unable to take anything p.o. We will discuss dietary consultation once able to follow commands. Currently n.p.o.  10.  Goals of care conversation. Patient with severe COVID-19 illness with secondary infections, ongoing encephalopathy with multiorgan failure including A. fib with RVR currently still severely hypoxic requiring nonrebreather as well as 15 g of oxygen via high flow nasal cannula. Husband at bedside.  Discussed in detail on 02/06/2020 regarding patient's current progress for last 22 days. Also discussed in detail regarding poor future prognosis should the patient has a cardiac arrest and quality of life. Husband currently agrees that the quality of life may significantly suffer but currently wants to process the information that he has been presented with.  11 accelerated hypertension. Blood pressure was elevated this morning. Improved with nitroglycerin patch.  Can remove the patch.  Diet: N.p.o. for now. Unfortunately unable to flush NG tube therefore unable to use it for now.  DVT Prophylaxis: Therapeutic  heparin     Advance goals of care discussion: Full code  Family Communication: family was present at bedside, at the time of interview.  The pt provided permission to discuss medical plan with the family. Opportunity was given to ask question and all questions were answered satisfactorily.   Disposition:  Status is: Inpatient  Remains inpatient appropriate because:Hemodynamically unstable and Altered mental status  Dispo:  Patient From:   Home  Planned Disposition:   To be determined  Expected discharge date: Not clear   Medically stable for discharge:    No  Subjective: Continues to remain confused.  Agitated.  Continuously repeating let it go wants to remove her restraints.  Liquid BM still present.  Oxygenation worsened this morning.  Physical Exam:  General: Appear in marked distress, no Rash; Oral Mucosa Clear, moist. no Abnormal Neck Mass Or lumps, Conjunctiva normal  Cardiovascular: S1 and S2 Present, no Murmur, Respiratory: increased respiratory effort, Bilateral Air entry present and bilateral Crackles, no wheezes Abdomen: Bowel Sound present, Soft and difficult to assess  tenderness Extremities: trace Pedal edema Neurology: obtunded and not oriented to time, place, and person affect emotionally labile. no new focal deficit Gait not checked due to patient safety concerns  Vitals:   02/07/20 1300 02/07/20 1506 02/07/20 1600 02/07/20 1700  BP: 119/61 111/65 115/66   Pulse: 82 96 88 97  Resp: 17 (!) 27 16 (!) 40  Temp: 98.4 F (36.9 C) 98.4 F (36.9 C) 98.6 F (37 C) 98.8 F (37.1 C)  TempSrc:   Bladder   SpO2: 91% (!) 88% (!) 89% (!) 89%  Weight:      Height:        Intake/Output Summary (Last 24 hours) at 02/07/2020 16101809 Last data filed  at 02/07/2020 1700 Gross per 24 hour  Intake 939.78 ml  Output 1175 ml  Net -235.22 ml   Filed Weights   02/05/20 0607 02/06/20 0500 02/07/20 0113  Weight: 107.5 kg 107.8 kg 106.7 kg    Data Reviewed: I have  personally reviewed and interpreted daily labs, tele strips, imagings as discussed above. I reviewed all nursing notes, pharmacy notes, vitals, pertinent old records I have discussed plan of care as described above with RN and patient/family.  CBC: Recent Labs  Lab 02/03/20 0250 02/04/20 0232 02/05/20 0241 02/06/20 0022 02/07/20 0516  WBC 16.6* 23.4* 15.2* 15.5* 14.9*  NEUTROABS 13.7* 19.7* 12.8* 13.1* 13.1*  HGB 9.2* 9.2* 8.0* 8.1* 8.1*  HCT 30.1* 29.5* 26.4* 25.8* 25.7*  MCV 98.4 99.0 100.8* 98.1 97.7  PLT 113* 120* 96* 71* 82*   Basic Metabolic Panel: Recent Labs  Lab 02/03/20 0250 02/04/20 0232 02/05/20 0241 02/06/20 0022 02/07/20 0516  NA 147* 146* 144 146* 147*  K 4.1 4.1 3.5 3.5 3.2*  CL 111 108 110 110 111  CO2 27 26 23 26 26   GLUCOSE 137* 173* 116* 103* 112*  BUN 34* 57* 52* 36* 29*  CREATININE 0.81 1.05* 1.03* 0.97 0.93  CALCIUM 8.9 9.3 8.5* 8.7* 8.8*  MG 2.0 2.1 2.1 2.1 2.2  PHOS 3.5 3.9 4.0 3.3 2.7    Studies: DG Abd Portable 1V  Result Date: 02/07/2020 CLINICAL DATA:  Evaluate NG tube.  Hiatal hernia. EXAM: PORTABLE ABDOMEN - 1 VIEW COMPARISON:  X-ray 02/04/2020.  CT 01/16/2020 FINDINGS: Feeding tube is looped on itself within the lower thorax, just to the left of midline, likely within the proximal stomach in patient's known hiatal hernia. Nonobstructive bowel gas pattern. IMPRESSION: Feeding tube looped on itself likely within the proximal stomach in patient's known hiatal hernia. Electronically Signed   By: 03/17/2020 D.O.   On: 02/07/2020 08:18   02/09/2020 EKG SITE RITE  Result Date: 02/07/2020 If Site Rite image not attached, placement could not be confirmed due to current cardiac rhythm.  02/09/2020 EKG SITE RITE  Result Date: 02/07/2020 If Site Rite image not attached, placement could not be confirmed due to current cardiac rhythm.   Scheduled Meds:  chlorhexidine  15 mL Mouth Rinse BID   Chlorhexidine Gluconate Cloth  6 each Topical Daily    furosemide  40 mg Intravenous BID   Gerhardt's butt cream   Topical BID   mouth rinse  15 mL Mouth Rinse BID   methylPREDNISolone (SOLU-MEDROL) injection  20 mg Intravenous Q24H   metoprolol tartrate  7.5 mg Intravenous Q6H   nystatin   Topical BID   QUEtiapine  25 mg Per Tube QHS   sodium chloride flush  10-40 mL Intracatheter Q12H   sodium chloride flush  3 mL Intravenous Q12H   Continuous Infusions:  sodium chloride Stopped (02/07/20 0657)   sodium chloride     amiodarone 30 mg/hr (02/07/20 1300)   diltiazem (CARDIZEM) infusion 15 mg/hr (02/07/20 1300)   heparin 950 Units/hr (02/07/20 1300)   PRN Meds: sodium chloride, sodium chloride, acetaminophen, albuterol, diclofenac Sodium, haloperidol lactate, lip balm, LORazepam, morphine injection, ondansetron (ZOFRAN) IV, sodium chloride flush, sodium chloride flush  Time spent: 35 minutes  Author: 02/09/20, MD [facility] 02/07/2020 6:09 PM  To reach On-call, see care teams to locate the attending and reach out via [email or web address]. Between 7PM-7AM, please contact night-coverage If you still have difficulty reaching the attending provider, please page the [facility] (Director on Call)  for [facility] on amion for assistance.

## 2020-02-07 NOTE — Progress Notes (Addendum)
ANTICOAGULATION CONSULT NOTE  Pharmacy Consult for Argatroban >> IV heparin Indication: Afib, HIT r/o  Allergies  Allergen Reactions  . Codeine Nausea And Vomiting    Patient Measurements: Height: 5\' 4"  (162.6 cm) Weight: 106.7 kg (235 lb 3.7 oz) IBW/kg (Calculated) : 54.7  Vital Signs: Temp: 98.1 F (36.7 C) (09/24 0530) BP: 166/70 (09/24 0530) Pulse Rate: 86 (09/24 0530)  Labs: Recent Labs     0000 02/05/20 0241 02/05/20 02/07/20 02/05/20 2121 02/06/20 0022 02/06/20 0229 02/06/20 0512 02/06/20 1009 02/06/20 1316 02/07/20 0516  HGB   < > 8.0*  --   --  8.1*  --   --   --   --  8.1*  HCT  --  26.4*  --   --  25.8*  --   --   --   --  25.7*  PLT  --  96*  --   --  71*  --   --   --   --  82*  APTT  --   --   --    < > 20*   < > 37* 43* 25  --   LABPROT  --   --  14.0  --   --   --   --   --   --   --   INR  --   --  1.1  --   --   --   --   --   --   --   HEPARINUNFRC  --   --   --   --   --   --   --   --   --  0.31  CREATININE  --  1.03*  --   --  0.97  --   --   --   --  0.93   < > = values in this interval not displayed.    Estimated Creatinine Clearance: 75.7 mL/min (by C-G formula based on SCr of 0.93 mg/dL).   Assessment: 64 y/o F admitted with worsening ShOB after previously being hospitalized for COVID-PNA. Hospital course has been prolonged and complicated by encephalopathy, UTI, dysphagia, and AFIB with RVR. Platelets along with hemoglobin have been progressively declining with PLT decreased significantly in the last several days. Patient seems to be outside of the window for HIT but after discussing with Dr. 44, other causes for severe thrombocytopenia seem less likely. 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 Plan is to proceed to order HIT panel and proceed with argatroban.   4T score= 4  Prior to restart of IV heparin:   HIT antibody negative at 0.094 - appears Onc and [facility] have fairly low suspicion of true HIT given clinical  picture  Discussed HIT Ab result with mid-level provider; will transition back to heparin with conservative dosing given bleeding risk   Today, 02/07/20   HL is 0.31, therapeutic  Hgb 8.1, low but consistent with previous result  Plt low, consistent with previous results  No bleeding issues per RN    Goal of Therapy:  Anti-Xa level 0.3-0.5 Monitor platelets by anticoagulation protocol: Yes   Plan:   Continue heparin infusion at 950 units/hr, no bolus  Obtain confirmatory heparin level in 8 hr  Daily CBC and heparin level  Per Cards, once need for further procedures ruled out, patient can transition to Eliquis in preparation for cardioversion    02/09/20, PharmD, BCPS 02/07/2020 6:17 AM

## 2020-02-07 NOTE — Plan of Care (Signed)
Discussed with patient plan of care for the evening, pain management and when we were giving a bath with no evidence of learning at this time.  Patient is very anxious and agitated and will give as needed medication when necessary.

## 2020-02-07 NOTE — Progress Notes (Signed)
ANTICOAGULATION CONSULT NOTE  Pharmacy Consult for Argatroban >> IV heparin Indication: Afib, HIT r/o  Allergies  Allergen Reactions  . Codeine Nausea And Vomiting    Patient Measurements: Height: 5\' 4"  (162.6 cm) Weight: 106.7 kg (235 lb 3.7 oz) IBW/kg (Calculated) : 54.7  Vital Signs: Temp: 98.8 F (37.1 C) (09/24 1900) Temp Source: Bladder (09/24 1600) BP: 154/60 (09/24 1925) Pulse Rate: 86 (09/24 1900)  Labs: Recent Labs     0000 02/05/20 0241 02/05/20 0852 02/05/20 2121 02/06/20 0022 02/06/20 0229 02/06/20 0512 02/06/20 1009 02/06/20 1316 02/07/20 0516 02/07/20 1800  HGB   < > 8.0*  --   --  8.1*  --   --   --   --  8.1*  --   HCT  --  26.4*  --   --  25.8*  --   --   --   --  25.7*  --   PLT  --  96*  --   --  71*  --   --   --   --  82*  --   APTT  --   --   --    < > 20*   < > 37* 43* 25  --   --   LABPROT  --   --  14.0  --   --   --   --   --   --   --   --   INR  --   --  1.1  --   --   --   --   --   --   --   --   HEPARINUNFRC  --   --   --   --   --   --   --   --   --  0.31 0.30  CREATININE  --  1.03*  --   --  0.97  --   --   --   --  0.93  --    < > = values in this interval not displayed.    Estimated Creatinine Clearance: 75.7 mL/min (by C-G formula based on SCr of 0.93 mg/dL).   Assessment: 64 y/o F admitted with worsening ShOB after previously being hospitalized for COVID-PNA. Hospital course has been prolonged and complicated by encephalopathy, UTI, dysphagia, and AFIB with RVR. Platelets along with hemoglobin have been progressively declining with PLT decreased significantly in the last several days. Patient seems to be outside of the window for HIT but after discussing with Dr. 44, other causes for severe thrombocytopenia seem less likely. 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 Plan is to proceed to order HIT panel and proceed with argatroban.   4T score= 4  Prior to restart of IV heparin:   HIT antibody  negative at 0.094 - appears Onc and [facility] have fairly low suspicion of true HIT given clinical picture  Discussed HIT Ab result with mid-level provider; will transition back to heparin with conservative dosing given bleeding risk   Today, 02/07/20   Confirmatory HL is 0.30, therapeutic with heparin infusing at 950 units/hr  Hgb 8.1, low but consistent with previous result  Plt low, consistent with previous results  No bleeding issues per RN    Goal of Therapy:  Anti-Xa level 0.3-0.5 Monitor platelets by anticoagulation protocol: Yes   Plan:   Continue heparin infusion at 950 units/hr, no bolus  Daily CBC and heparin level  Per Cards, once need  for further procedures ruled out, patient can transition to Eliquis    Kyani Simkin P. Casimiro Needle, PharmD, BCPS Clinical Pharmacist 02/07/2020 7:46 PM

## 2020-02-07 NOTE — Progress Notes (Signed)
OT Cancellation Note  Patient Details Name: Julie Mcguire MRN: [id number] DOB: Sep 23, 1955   Cancelled Treatment:    Reason Eval/Treat Not Completed: Medical issues which prohibited therapy. Patient now on 45 L HHFNC and NRB. Patient agitated, confused, restrained and unable to follow commands therefore therapy will hold. OT initially evaluated patient on 9/3 and patient's status declined and patient not appropriate for treatment. Patient re-evaluated on 9/17. Will continue to follow but will be considering signing off due to patient not being inappropriate for therapy for 7 days.  Harumi Yamin L Jatorian Renault 02/07/2020, 11:01 AM

## 2020-02-07 NOTE — TOC Progression Note (Addendum)
Transition of Care [facility]) - Progression Note    Patient Details  Name: KHARA RENAUD MRN: [id number] Date of Birth: 01-03-1956  [facility] CM/SW Contact  Golda Acre, RN Phone Number: 02/07/2020, 7:49 AM  Clinical Narrative:    HFPRB mask at 43/Miin, wbc 14.9 ns 147, k.2=3, d.dimer 1.28 Awake and alert,  Plan to send to [facility] for rehab.passar number [id number] A fl2 completed. o21 demands are still to high to transfer.  Expected Discharge Plan: Skilled Nursing Facility Barriers to Discharge: Continued Medical Work up  Expected Discharge Plan and Services Expected Discharge Plan: Skilled Nursing Facility   Discharge Planning Services: CM Consult   Living arrangements for the past 2 months: Single Family Home                                       Social Determinants of Health (SDOH) Interventions    Readmission Risk Interventions No flowsheet data found.

## 2020-02-07 NOTE — Progress Notes (Addendum)
HEMATOLOGY-ONCOLOGY PROGRESS NOTE  SUBJECTIVE: O2 requirements have increased this morning.  No bleeding reported.   PHYSICAL EXAMINATION:  Vitals:   02/07/20 1100 02/07/20 1112  BP:    Pulse: 95 99  Resp: (!) 24 (!) 24  Temp: 98.2 F (36.8 C) 98.2 F (36.8 C)  SpO2: 90% 92%   Filed Weights   02/05/20 0607 02/06/20 0500 02/07/20 0113  Weight: 107.5 kg 107.8 kg 106.7 kg    Intake/Output from previous day: 09/23 0701 - 09/24 0700 In: 1226.8 [I.V.:1206.8] Out: 1355 [Urine:1075; Stool:280]  GENERAL: Lethargic, opens eyes but does not answer questions SKIN: No petechiae, ecchymoses noted to left arm and lower abdomen NEURO: Opens eyes, minimally responsive  LABORATORY DATA:  I have reviewed the data as listed CMP Latest Ref Rng & Units 02/07/2020 02/06/2020 02/05/2020  Glucose 70 - 99 mg/dL 161(W) 960(A) 540(J)  BUN 8 - 23 mg/dL 81(X) 91(Y) 78(G)  Creatinine 0.44 - 1.00 mg/dL 9.56 2.13 0.86(V)  Sodium 135 - 145 mmol/L 147(H) 146(H) 144  Potassium 3.5 - 5.1 mmol/L 3.2(L) 3.5 3.5  Chloride 98 - 111 mmol/L 111 110 110  CO2 22 - 32 mmol/L Calcium 8.9 - 10.3 mg/dL 7.8(I) 6.9(G) 2.9(B)  Total Protein 6.5 - 8.1 g/dL 2.8(U) 1.3(K) 5.3(L)  Total Bilirubin 0.3 - 1.2 mg/dL 0.6 0.6 0.5  Alkaline Phos 38 - 126 U/L 59 60 58  AST 15 - 41 U/L 28 36 37  ALT 0 - 44 U/L 60(H) 68(H) 66(H)    Lab Results  Component Value Date   WBC 14.9 (H) 02/07/2020   HGB 8.1 (L) 02/07/2020   HCT 25.7 (L) 02/07/2020   MCV 97.7 02/07/2020   PLT 82 (L) 02/07/2020   NEUTROABS 13.1 (H) 02/07/2020    DG Abd 1 View  Result Date: 02/04/2020 CLINICAL DATA:  Check gastric catheter placement EXAM: ABDOMEN - 1 VIEW COMPARISON:  01/31/2020 FINDINGS: Previously seen weighted feeding catheter has been withdrawn and now lies within the distal esophagus. This should be advanced several cm. IMPRESSION: Weighted feeding catheter within the distal esophagus. Electronically Signed   By: Alcide Clever M.D.   On:  02/04/2020 02:24   DG Abd 1 View  Result Date: 01/31/2020 CLINICAL DATA:  Evaluate OG tube placement EXAM: ABDOMEN - 1 VIEW COMPARISON:  January 30, 2020 FINDINGS: The feeding tube is looped back on itself in the upper abdomen at midline, likely in the proximal stomach. IMPRESSION: The feeding tube is looped back on itself in the upper abdomen at midline, likely in the proximal stomach. Electronically Signed   By: Gerome Sam III M.D   On: 01/31/2020 15:17   DG Abd 1 View  Result Date: 01/30/2020 CLINICAL DATA:  Feeding tube placement EXAM: ABDOMEN - 1 VIEW COMPARISON:  01/29/2020 FINDINGS: Feeding tube is seen in the upper neck in the region of the oropharynx. Extensive bilateral airspace opacities throughout the lungs. Nonobstructive bowel gas pattern. IMPRESSION: Feeding tube seen in the oropharyngeal region. Extensive bilateral airspace disease, stable. Electronically Signed   By: Charlett Nose M.D.   On: 01/30/2020 12:41   DG Abd 1 View  Result Date: 01/29/2020 CLINICAL DATA:  Check feeding catheter placement EXAM: ABDOMEN - 1 VIEW COMPARISON:  CT from 01/16/2020 FINDINGS: Feeding catheter is noted coiled over the lower left chest lying within the gastric fundus which is within a hiatal hernia. IMPRESSION: Feeding catheter coiled within the fundus of the stomach in a hiatal hernia. Electronically Signed  By: Alcide Clever M.D.   On: 01/29/2020 13:45   CT HEAD WO CONTRAST  Result Date: 01/27/2020 CLINICAL DATA:  Mental status change EXAM: CT HEAD WITHOUT CONTRAST TECHNIQUE: Contiguous axial images were obtained from the base of the skull through the vertex without intravenous contrast. COMPARISON:  None. FINDINGS: Brain: No evidence of acute infarction, hemorrhage, extra-axial collection, ventriculomegaly, or mass effect. Generalized cerebral atrophy. Periventricular white matter low attenuation likely secondary to microangiopathy. Vascular: No significant cerebrovascular atherosclerosis. No  hyperdense vessel. Skull: Negative for fracture or focal lesion. Sinuses/Orbits: Visualized portions of the orbits are unremarkable. Visualized portions of the paranasal sinuses are unremarkable. Bilateral mastoid effusions. Other: None. IMPRESSION: 1. No acute intracranial pathology. 2. Chronic microvascular disease and cerebral atrophy. 3. Bilateral mastoid effusions. Electronically Signed   By: Elige Ko   On: 01/27/2020 14:07   CT ANGIO CHEST PE W OR WO CONTRAST  Result Date: 01/16/2020 CLINICAL DATA:  Pulmonary embolus suspected, positive D-dimer inpatient with recent COVID pneumonia, hypertension, moderate obesity presenting to the ER with worsening shortness of breath. EXAM: CT ANGIOGRAPHY CHEST WITH CONTRAST TECHNIQUE: Multidetector CT imaging of the chest was performed using the standard protocol during bolus administration of intravenous contrast. Multiplanar CT image reconstructions and MIPs were obtained to evaluate the vascular anatomy. CONTRAST:  OMNIPAQUE IOHEXOL 350 MG/ML SOLN COMPARISON:  CT angio chest 05/01/2004. FINDINGS: Cardiovascular: Satisfactory opacification of the pulmonary arteries to the segmental level. No evidence of pulmonary embolism. Normal heart size. No significant pericardial effusion. The thoracic aorta is normal in caliber. Mild atherosclerotic plaque. Mediastinum/Nodes: Moderate volume hiatal hernia. There is a right 1.4 cm precarinal lymph node is likely reactive in etiology open (4:39). No enlarged hilar, or axillary lymph nodes. Thyroid gland, trachea, and esophagus demonstrate no significant findings. Lungs/Pleura: Extensive diffuse peripheral and peribronchovascular ground-glass and consolidative opacities that are more prominent in bilateral upper lobes. No pulmonary nodule or mass in the expanded lungs. No pleural effusion. No pneumothorax. Upper Abdomen: No acute abnormality.  Status post cholecystectomy. Musculoskeletal: At least moderate multilevel  degenerative changes of the spine. Review of the MIP images confirms the above findings. IMPRESSION: No pulmonary embolus to the segmental level. Findings consistent with known COVID 19 infection with superimposed pulmonary edema. Associated, likely reactive, 1.4 cm right precarinal lymph node. Aortic Atherosclerosis (ICD10-I70.0). Electronically Signed   By: Tish Frederickson M.D.   On: 01/16/2020 14:13   DG CHEST PORT 1 VIEW  Result Date: 02/04/2020 CLINICAL DATA:  Shortness of breath.  History of COVID pneumonia. EXAM: PORTABLE CHEST 1 VIEW COMPARISON:  01/31/2020. FINDINGS: Interim removal of feeding tube. Heart size stable. Diffuse severe bilateral pulmonary infiltrates are again noted without interim change. No pleural effusion or pneumothorax. IMPRESSION: 1.  Interim removal of feeding tube. 2. Diffuse severe bilateral pulmonary infiltrates are again noted without interim change. Electronically Signed   By: Maisie Fus  Register   On: 02/04/2020 09:55   DG CHEST PORT 1 VIEW  Result Date: 01/31/2020 CLINICAL DATA:  Shortness of breath.  COVID-19. EXAM: PORTABLE CHEST 1 VIEW COMPARISON:  January 29, 2020. FINDINGS: Stable cardiomediastinal silhouette. Feeding tube is seen entering stomach. No pneumothorax or pleural effusion is noted. Stable bilateral patchy airspace opacities are noted consistent with multifocal pneumonia. Bony thorax is unremarkable. IMPRESSION: Stable bilateral multifocal pneumonia. Electronically Signed   By: Lupita Raider M.D.   On: 01/31/2020 08:55   DG CHEST PORT 1 VIEW  Result Date: 01/29/2020 CLINICAL DATA:  COVID-19 positivity EXAM: PORTABLE  CHEST 1 VIEW COMPARISON:  01/25/2020 FINDINGS: Cardiac shadow is stable. Diffuse bilateral airspace opacities are again identified stable in appearance from the prior exam. No new focal abnormality is noted. No bony abnormality is noted. IMPRESSION: Stable airspace opacities bilaterally consistent with the given clinical history.  Electronically Signed   By: Alcide Clever M.D.   On: 01/29/2020 07:22   DG Chest Port 1 View  Result Date: 01/25/2020 CLINICAL DATA:  COVID pneumonia EXAM: PORTABLE CHEST 1 VIEW COMPARISON:  Radiograph 01/21/2020 FINDINGS: Stable cardiac silhouette. Patchy bilateral airspace disease is slightly improved compared to prior. No pneumothorax. No pleural fluid. IMPRESSION: Slight improvement in patchy bilateral airspace disease. Electronically Signed   By: Genevive Bi M.D.   On: 01/25/2020 06:59   DG CHEST PORT 1 VIEW  Result Date: 01/21/2020 CLINICAL DATA:  Shortness of breath.  COVID. EXAM: PORTABLE CHEST 1 VIEW COMPARISON:  01/20/2020. FINDINGS: Low lung volumes. Diffuse progressive dense bilateral pulmonary infiltrates. Heart size stable. Left pleural effusion cannot be excluded. No pneumothorax. Degenerative changes scoliosis thoracic spine. IMPRESSION: Low lung volumes. Diffuse progressive dense bilateral pulmonary infiltrates. Left pleural effusion cannot be excluded. Electronically Signed   By: Maisie Fus  Register   On: 01/21/2020 05:36   DG CHEST PORT 1 VIEW  Result Date: 01/20/2020 CLINICAL DATA:  Dyspnea EXAM: PORTABLE CHEST 1 VIEW COMPARISON:  12/23/2019 FINDINGS: Shallow inspiration. Bilateral airspace infiltration with increasing consolidation and air bronchograms on the left since previous study. No pleural effusions. No pneumothorax. Heart size is obscured. IMPRESSION: Bilateral airspace infiltration with increasing consolidation and air bronchograms on the left. Electronically Signed   By: Burman Nieves M.D.   On: 01/20/2020 05:00   DG Chest Port 1 View  Result Date: 12/15/2019 CLINICAL DATA:  COVID positive with shortness of breath. EXAM: PORTABLE CHEST 1 VIEW COMPARISON:  January 07, 2020 FINDINGS: Moderate to marked severity bilateral patchy multifocal infiltrates are seen. The heart size and mediastinal contours are within normal limits. Degenerative changes seen throughout the  thoracic spine. IMPRESSION: Moderate to marked severity bilateral patchy multifocal infiltrates. Electronically Signed   By: Aram Candela M.D.   On: 01/02/2020 16:59   DG Abd Portable 1V  Result Date: 02/07/2020 CLINICAL DATA:  Evaluate NG tube.  Hiatal hernia. EXAM: PORTABLE ABDOMEN - 1 VIEW COMPARISON:  X-ray 02/04/2020.  CT 01/16/2020 FINDINGS: Feeding tube is looped on itself within the lower thorax, just to the left of midline, likely within the proximal stomach in patient's known hiatal hernia. Nonobstructive bowel gas pattern. IMPRESSION: Feeding tube looped on itself likely within the proximal stomach in patient's known hiatal hernia. Electronically Signed   By: Duanne Guess D.O.   On: 02/07/2020 08:18   DG INTRO LONG GI TUBE  Result Date: 02/06/2020 CLINICAL DATA:  Dysphagia.  Food tube placement. EXAM: FEEDING TUBE PLACEMENT UNDER FLUOROSCOPIC GUIDANCE FLUOROSCOPY TIME:  Fluoroscopy Time:  2 minutes 0 seconds Radiation Exposure Index (if provided by the fluoroscopic device): 32.7 mGy Number of Acquired Spot Images: 0 COMPARISON:  01/30/2020 FINDINGS: With the patient in supine position on the fluoroscopy table, a Dobbhoff feeding tube was inserted into the right naris, and was subsequently passed into the esophagus. The feeding tube was advanced under fluoroscopic guidance, and was coiled within a hiatal hernia, as happened during previous feeding tube placement . The feeding tube could not be advanced into the distal stomach or duodenum despite multiple attempts. The patient tolerated the procedure well, and was subsequently returned to the ICU. IMPRESSION: Placement  of Dobbhoff feeding tube under fluoroscopic guidance. The feeding tube was coiled within a hiatal hernia as done previously, but could not be advanced into the distal stomach or duodenum. Electronically Signed   By: Danae Orleans M.D.   On: 02/06/2020 15:51   DG INTRO LONG GI TUBE  Result Date: 01/30/2020 CLINICAL DATA:   Feeding tube placement EXAM: FL FEEDING TUBE PLACEMENT FLUOROSCOPY TIME:  Fluoroscopy Time:  8 minutes 12 seconds Radiation Exposure Index (if provided by the fluoroscopic device): Number of Acquired Spot Images: 0 COMPARISON:  None. FINDINGS: Fluoroscopic guidance was utilized to advance the feeding tube from the oropharynx into the esophagus. Feeding tube coiled in the hiatal hernia. Multiple attempts were made to pass the feeding tube through the hiatal hernia unsuccessfully. This included multiple different guiding wires. Finally, after numerous unsuccessful attempts, the feeding tube was looped in the hiatal hernia and secured to the nose. IMPRESSION: Unsuccessful attempts at advancing the feeding tube through the hiatal hernia despite multiple attempts with different guiding wires. Electronically Signed   By: Charlett Nose M.D.   On: 01/30/2020 16:57   EEG adult  Result Date: 02/05/2020 Charlsie Quest, MD     02/05/2020  2:41 PM Patient Name: Julie Mcguire MRN: [id number] Epilepsy Attending: Charlsie Quest Referring Physician/Provider: Dr Lynden Oxford Date: 02/05/2020 Duration: 25.14 mins Patient history: 63yo F with ams. EEG to evaluate for seizure. Level of alertness: Awake AEDs during EEG study: None Technical aspects: This EEG study was done with scalp electrodes positioned according to the 10-20 International system of electrode placement. Electrical activity was acquired at a sampling rate of  and reviewed with a high frequency filter of  and a low frequency filter of . EEG data were recorded continuously and digitally stored. Description: No posterior dominant rhythm was seen. EEG showed continuous generalized 3 to 6 Hz theta-delta slowing. Hyperventilation and photic stimulation were not performed.   ABNORMALITY -Continuous slow, generalized IMPRESSION: This study is suggestive of moderate diffuse encephalopathy, nonspecific etiology. No seizures or epileptiform discharges were seen  throughout the recording. Charlsie Quest   ECHOCARDIOGRAM COMPLETE  Result Date: 02/05/2020    ECHOCARDIOGRAM REPORT   Patient Name:   Julie Mcguire [facility] Date of Exam: 02/05/2020 Medical Rec #:  [id number]       Height:       64.0 in Accession #:    [id number]      Weight:       237.0 lb Date of Birth:  May 20, 1955      BSA:          2.103 m Patient Age:    63 years        BP:           133/91 mmHg Patient Gender: F               HR:           157 bpm. Exam Location:  Inpatient Procedure: 2D Echo, Cardiac Doppler and Color Doppler                                MODIFIED REPORT:   This report was modified by Laurance Flatten MD on 02/05/2020 due to Change  TAPSE.  Indications:     Atrial Fibrillation 427.31 / I48.91  History:         Patient has no prior history of Echocardiogram examinations.                  Risk Factors:Hypertension and Former Smoker.  Sonographer:     Renella Cunas RDCS Referring Phys:  [id number] [facility] M PATEL Diagnosing Phys: Laurance Flatten MD IMPRESSIONS  1. Left ventricular ejection fraction, by estimation, is 60 to 65%. The left ventricle has normal function. The left ventricle has no regional wall motion abnormalities. Indeterminate diastolic filling due to E-A fusion.  2. Right ventricular systolic function is normal. The right ventricular size is normal.  3. The mitral valve is normal in structure. Trivial mitral valve regurgitation. No evidence of mitral stenosis.  4. The aortic valve is tricuspid. There is mild thickening of the aortic valve. Aortic valve regurgitation is not visualized. Mild aortic valve sclerosis is present, with no evidence of aortic valve stenosis.  5. The inferior vena cava is normal in size with greater than 50% respiratory variability, suggesting right atrial pressure of 3 mmHg. FINDINGS  Left Ventricle: Left ventricular ejection fraction, by estimation, is 60 to 65%. The left ventricle has normal function. The left ventricle  has no regional wall motion abnormalities. The left ventricular internal cavity size was normal in size. There is  no left ventricular hypertrophy. Indeterminate diastolic filling due to E-A fusion. Right Ventricle: The right ventricular size is normal. No increase in right ventricular wall thickness. Right ventricular systolic function is normal. Left Atrium: Left atrial size was normal in size. Right Atrium: Right atrial size was normal in size. Pericardium: There is no evidence of pericardial effusion. Mitral Valve: The mitral valve is normal in structure. There is mild thickening of the mitral valve leaflet(s). Trivial mitral valve regurgitation. No evidence of mitral valve stenosis. Tricuspid Valve: Peak PASP +RAP. The tricuspid valve is normal in structure. Tricuspid valve regurgitation is mild. Aortic Valve: The aortic valve is tricuspid. There is mild thickening of the aortic valve. There is mild aortic valve annular calcification. Aortic valve regurgitation is not visualized. Mild aortic valve sclerosis is present, with no evidence of aortic valve stenosis. Pulmonic Valve: The pulmonic valve was normal in structure. Pulmonic valve regurgitation is not visualized. Aorta: The aortic root is normal in size and structure. Venous: The inferior vena cava is normal in size with greater than 50% respiratory variability, suggesting right atrial pressure of 3 mmHg. IAS/Shunts: The atrial septum is grossly normal.  LEFT VENTRICLE PLAX 2D LVIDd:         3.65 cm LVIDs:         2.10 cm LV PW:         0.90 cm LV IVS:        0.90 cm LVOT diam:     2.00 cm LV SV:         45 LV SV Index:   21 LVOT Area:     3.14 cm  LEFT ATRIUM             Index       RIGHT ATRIUM           Index LA Vol (A2C):   44.3 ml 21.07 ml/m RA Area:     11.90 cm LA Vol (A4C):   31.1 ml 14.79 ml/m RA Volume:   27.90 ml  13.27 ml/m LA Biplane Vol: 37.8 ml 17.98 ml/m  AORTIC VALVE LVOT Vmax:   112.00 cm/s LVOT Vmean:  79.200 cm/s LVOT VTI:     0.142 m  AORTA Ao Root diam: 2.90 cm  SHUNTS Systemic VTI:  0.14 m Systemic Diam: 2.00 cm Laurance FlattenHeather Pemberton MD Electronically signed by Laurance FlattenHeather Pemberton MD Signature Date/Time: 02/05/2020/12:02:16 PM    Final (Updated)    VAS US LOWER EXTREMITY VENOUS (DVT)  Result Date: 01/16/2020  Lower Venous DVTStudy Indications: Elevated ddimer, covid +.  Performing Technologist: Blanch MediaMegan Riddle RVS  Examination Guidelines: A complete evaluation includes B-mode imaging, spectral Doppler, color Doppler, and power Doppler as needed of all accessible portions of each vessel. Bilateral testing is considered an integral part of a complete examination. Limited examinations for reoccurring indications may be performed as noted. The reflux portion of the exam is performed with the patient in reverse Trendelenburg.  +---------+---------------+---------+-----------+----------+--------------+ RIGHT    CompressibilityPhasicitySpontaneityPropertiesThrombus Aging +---------+---------------+---------+-----------+----------+--------------+ CFV      Full           Yes      Yes                                 +---------+---------------+---------+-----------+----------+--------------+ SFJ      Full                                                        +---------+---------------+---------+-----------+----------+--------------+ FV Prox  Full                                                        +---------+---------------+---------+-----------+----------+--------------+ FV Mid   Full                                                        +---------+---------------+---------+-----------+----------+--------------+ FV DistalFull                                                        +---------+---------------+---------+-----------+----------+--------------+ PFV      Full                                                        +---------+---------------+---------+-----------+----------+--------------+  POP      Full           Yes      Yes                                 +---------+---------------+---------+-----------+----------+--------------+ PTV      Full                                                        +---------+---------------+---------+-----------+----------+--------------+  PERO     Full                                                        +---------+---------------+---------+-----------+----------+--------------+   +---------+---------------+---------+-----------+----------+--------------+ LEFT     CompressibilityPhasicitySpontaneityPropertiesThrombus Aging +---------+---------------+---------+-----------+----------+--------------+ CFV      Full           Yes      Yes                                 +---------+---------------+---------+-----------+----------+--------------+ SFJ      Full                                                        +---------+---------------+---------+-----------+----------+--------------+ FV Prox  Full                                                        +---------+---------------+---------+-----------+----------+--------------+ FV Mid                  Yes      Yes                                 +---------+---------------+---------+-----------+----------+--------------+ FV Distal               Yes      Yes                                 +---------+---------------+---------+-----------+----------+--------------+ PFV      Full                                                        +---------+---------------+---------+-----------+----------+--------------+ POP      Full           Yes      Yes                                 +---------+---------------+---------+-----------+----------+--------------+ PTV      Full                                                        +---------+---------------+---------+-----------+----------+--------------+ PERO     Full                                                         +---------+---------------+---------+-----------+----------+--------------+  Summary: BILATERAL: - No evidence of deep vein thrombosis seen in the lower extremities, bilaterally. - No evidence of superficial venous thrombosis in the lower extremities, bilaterally. -   *See table(s) above for measurements and observations. Electronically signed by Lemar Livings MD on 01/16/2020 at 4:49:58 PM.    Final    [place] EKG SITE RITE  Result Date: 02/07/2020 If Site Rite image not attached, placement could not be confirmed due to current cardiac rhythm.  [place] EKG SITE RITE  Result Date: 02/07/2020 If Site Rite image not attached, placement could not be confirmed due to current cardiac rhythm.   ASSESSMENT AND PLAN: 1.  Thrombocytopenia 2.  Leukocytosis secondary to steroids and infection/inflammation 3.  Anemia-multifactorial 4.  Acute hypoxic respiratory failure secondary to COVID-19 pneumonia 5.  Sepsis, resolved 6.  Acute metabolic encephalopathy 7.  Staph epi urinary tract infection 8.  Atrial fibrillation with RVR  Julie Mcguire  appears stable.  Lately count is trending upward today.  HIT panel not indicative of HIT.  Thrombocytopenia likely due to bone marrow suppression in the setting of COVID-19 infection although polypharmacy may be contributing.  She is anemic but hemoglobin is stable.  Recommendations: 1.    Continue to follow daily CBC and transfuse platelets if platelet count is below 10,000 or she develops active bleeding. 2.    HIT panel not indicative of HIT.  The patient may be considered for heparin for her A. fib  Will sign off at this time.  Please call hematology as needed.   LOS: 24 days   Clenton Pare, DNP, AGPCNP-BC, AOCNP 02/07/20 Julie Mcguire remains encephalopathic.  She is mounting an appropriate reticulocytosis.  The DAT is negative.  The HIT antibody returned negative.  The hemoglobin is stable and the platelet count is slightly higher today.   I discussed the case with Dr. Allena Katz.  Hematology will sign off of the case.  We are available to see her again as needed.

## 2020-02-07 NOTE — Progress Notes (Signed)
Progress Note  Patient Name: Julie Mcguire Date of Encounter: 02/07/2020  [facility] Cardiologist: Olga Millers, MD   Subjective   Patient is on 100% rebreather.  Remains encephalopathic.  Inpatient Medications    Scheduled Meds: . chlorhexidine  15 mL Mouth Rinse BID  . Chlorhexidine Gluconate Cloth  6 each Topical Daily  . free water  100 mL Per Tube Q6H  . furosemide  40 mg Intravenous BID  . Gerhardt's butt cream   Topical BID  . mouth rinse  15 mL Mouth Rinse BID  . methylPREDNISolone (SOLU-MEDROL) injection  20 mg Intravenous Q24H  . metoprolol tartrate  7.5 mg Intravenous Q6H  . nitroGLYCERIN  0.4 mg Transdermal Daily  . nystatin   Topical BID  . QUEtiapine  25 mg Per Tube QHS  . sodium chloride flush  3 mL Intravenous Q12H   Continuous Infusions: . sodium chloride Stopped (02/07/20 0657)  . sodium chloride    . amiodarone 30 mg/hr (02/07/20 0757)  . diltiazem (CARDIZEM) infusion 15 mg/hr (02/07/20 1016)  . feeding supplement (OSMOLITE 1.5 CAL)    . heparin 950 Units/hr (02/07/20 1201)  . potassium chloride 10 mEq (02/07/20 1213)   PRN Meds: sodium chloride, sodium chloride, acetaminophen, albuterol, diclofenac Sodium, haloperidol lactate, lip balm, LORazepam, morphine injection, ondansetron (ZOFRAN) IV, sodium chloride flush   Vital Signs    Vitals:   02/07/20 1020 02/07/20 1100 02/07/20 1112 02/07/20 1200  BP: 129/71     Pulse: 91 95 99   Resp: 20 (!) 24 (!) 24   Temp: 98.2 F (36.8 C) 98.2 F (36.8 C) 98.2 F (36.8 C) 98.4 F (36.9 C)  TempSrc:    Axillary  SpO2: 91% 90% 92%   Weight:      Height:        Intake/Output Summary (Last 24 hours) at 02/07/2020 1222 Last data filed at 02/07/2020 0657 Gross per 24 hour  Intake 879.82 ml  Output 1355 ml  Net -475.18 ml   Last 3 Weights 02/07/2020 02/06/2020 02/05/2020  Weight (lbs) 235 lb 3.7 oz 237 lb 10.5 oz 236 lb 15.9 oz  Weight (kg) 106.7 kg 107.8 kg 107.5 kg      Telemetry    Normal  sinus rhythm- Personally Reviewed   Physical Exam   GEN:  Well-developed, encephalopathic, 100% rebreather Neck: No JVD Cardiac: RRR Respiratory:  Diminished breath sounds GI: Soft, nontender, non-distended  MS: No edema; No deformity. Neuro:  Moves all extremities but remains encephalopathic.   Labs    Chemistry Recent Labs  Lab 02/05/20 0241 02/06/20 0022 02/07/20 0516  NA 144 146* 147*  K 3.5 3.5 3.2*  CL 110 110 111  CO2 23 26 26   GLUCOSE 116* 103* 112*  BUN 52* 36* 29*  CREATININE 1.03* 0.97 0.93  CALCIUM 8.5* 8.7* 8.8*  PROT 5.3* 5.5* 5.6*  ALBUMIN 2.5* 2.5* 2.5*  AST 37 36 28  ALT 66* 68* 60*  ALKPHOS 58 60 59  BILITOT 0.5 0.6 0.6  GFRNONAA 59* >60 >60  GFRAA >60 >60 >60  ANIONGAP 11 10 10      Hematology Recent Labs  Lab 02/05/20 0241 02/05/20 0241 02/06/20 0022 02/06/20 1718 02/07/20 0516  WBC 15.2*  --  15.5*  --  14.9*  RBC 2.62*   < > 2.63* 2.64* 2.63*  HGB 8.0*  --  8.1*  --  8.1*  HCT 26.4*  --  25.8*  --  25.7*  MCV 100.8*  --  98.1  --  97.7  MCH 30.5  --  30.8  --  30.8  MCHC 30.3  --  31.4  --  31.5  RDW 15.9*  --  16.1*  --  16.6*  PLT 96*  --  71*  --  82*   < > = values in this interval not displayed.    DDimer  Recent Labs  Lab 02/05/20 0241 02/06/20 0022 02/07/20 0516  DDIMER 0.73* 0.88* 1.28*     Radiology    DG Abd Portable 1V  Result Date: 02/07/2020 CLINICAL DATA:  Evaluate NG tube.  Hiatal hernia. EXAM: PORTABLE ABDOMEN - 1 VIEW COMPARISON:  X-ray 02/04/2020.  CT 01/16/2020 FINDINGS: Feeding tube is looped on itself within the lower thorax, just to the left of midline, likely within the proximal stomach in patient's known hiatal hernia. Nonobstructive bowel gas pattern. IMPRESSION: Feeding tube looped on itself likely within the proximal stomach in patient's known hiatal hernia. Electronically Signed   By: Duanne Guess D.O.   On: 02/07/2020 08:18   DG INTRO LONG GI TUBE  Result Date: 02/06/2020 CLINICAL  DATA:  Dysphagia.  Food tube placement. EXAM: FEEDING TUBE PLACEMENT UNDER FLUOROSCOPIC GUIDANCE FLUOROSCOPY TIME:  Fluoroscopy Time:  2 minutes 0 seconds Radiation Exposure Index (if provided by the fluoroscopic device): 32.7 mGy Number of Acquired Spot Images: 0 COMPARISON:  01/30/2020 FINDINGS: With the patient in supine position on the fluoroscopy table, a Dobbhoff feeding tube was inserted into the right naris, and was subsequently passed into the esophagus. The feeding tube was advanced under fluoroscopic guidance, and was coiled within a hiatal hernia, as happened during previous feeding tube placement . The feeding tube could not be advanced into the distal stomach or duodenum despite multiple attempts. The patient tolerated the procedure well, and was subsequently returned to the ICU. IMPRESSION: Placement of Dobbhoff feeding tube under fluoroscopic guidance. The feeding tube was coiled within a hiatal hernia as done previously, but could not be advanced into the distal stomach or duodenum. Electronically Signed   By: Danae Orleans M.D.   On: 02/06/2020 15:51   EEG adult  Result Date: 02/05/2020 Charlsie Quest, MD     02/05/2020  2:41 PM Patient Name: Julie Mcguire MRN: [id number] Epilepsy Attending: Charlsie Quest Referring Physician/Provider: Dr Lynden Oxford Date: 02/05/2020 Duration: 25.14 mins Patient history: 64yo F with ams. EEG to evaluate for seizure. Level of alertness: Awake AEDs during EEG study: None Technical aspects: This EEG study was done with scalp electrodes positioned according to the 10-20 International system of electrode placement. Electrical activity was acquired at a sampling rate of 500Hz  and reviewed with a high frequency filter of 70Hz  and a low frequency filter of 1Hz . EEG data were recorded continuously and digitally stored. Description: No posterior dominant rhythm was seen. EEG showed continuous generalized 3 to 6 Hz theta-delta slowing. Hyperventilation and photic  stimulation were not performed.   ABNORMALITY -Continuous slow, generalized IMPRESSION: This study is suggestive of moderate diffuse encephalopathy, nonspecific etiology. No seizures or epileptiform discharges were seen throughout the recording.   EKG SITE RITE  Result Date: 02/07/2020 [facility] image not attached, placement could not be confirmed due to current cardiac rhythm.  [place] EKG SITE RITE  Result Date: 02/07/2020 If Site Rite image not attached, placement could not be confirmed due to current cardiac rhythm.    Patient Profile     64 y.o.femalewith a PMH of HTN, morbid  obesity, and recent COVID-19 PNA,who is being followed by cardiology for the evaluation of atrial fibrillation with RVR  Assessment & Plan    1 New onset atrial fibrillation-patient has converted to sinus rhythm.  Continue amiodarone, Cardizem, metoprolol and heparin.  Transition to oral medications when able.   2 thrombocytopenia-unchanged compared to previous.  Heparin-induced coagulation negative.  3 hypertension-continue Cardizem and metoprolol.  Blood pressure appears to be controlled.  4 metabolic encephalopathy-persists.  Per primary care.  5 respiratory failure-pulmonary managing.  For questions or updates, please contact [facility] Please consult [email or web address] for contact info under        Signed, Olga Millers, MD  02/07/2020, 12:22 PM

## 2020-02-07 NOTE — Progress Notes (Addendum)
SLP Cancellation Note  Patient Details Name: Julie Mcguire MRN: [id number] DOB: 24-Sep-1955   Cancelled treatment:       Reason Eval/Treat Not Completed: Other (comment);Medical issues which prohibited therapy (pt on 45 L HFNC and NRB, she has small bore feeding tube, will continue efforts)  Rolena Infante, MS [facility] SLP Acute Rehab Services Office [phone]  Chales Abrahams 02/07/2020, 10:25 AM

## 2020-02-07 NOTE — Progress Notes (Signed)
Nutrition Follow-up  DOCUMENTATION CODES:   Morbid obesity  INTERVENTION:   -Osmolite 1.5 @ 20 ml/hr via NGT -Free water per MD: 100 ml every 6 hours  If tube stays in place and pt is tolerating, advance by 10 ml every 8 hours to goal rate of 55 ml/hr. -Add 45 ml Prosource TF TID via tube.  NUTRITION DIAGNOSIS:   Inadequate oral intake related to inability to eat as evidenced by NPO status.  Ongoing  GOAL:   Patient will meet greater than or equal to 90% of their needs  Progressing.  MONITOR:   TF tolerance, Labs, Weight trends  REASON FOR ASSESSMENT:   Consult Enteral/tube feeding initiation and management  ASSESSMENT:   64 yo unvaccinated female admitted with acute respiratory failure with referactory COVID-19 pneumonia on 8/31. PMH includes HTN.  Pt currently disoriented x 4.  Per chart review, pt's NGT was pulled out 9/21 d/t KUB showing tube was in distal esophagus. NGT replaced 9/23 but was unable to be placed in distal stomach or duodenum d/t hiatal hernia.  MD consulted to start trickle feeds. Will leave recommendations for advancement if pt is able to tolerate and tube stays in.  Admission weight: 262 lbs. Current weight: 235 lbs.  Medications: IV Lasix Labs reviewed:  CBGs: 103-109 Elevated Na Low K  Diet Order:   Diet Order            Diet NPO time specified Except for: Sips with Meds, Ice Chips  Diet effective now                 EDUCATION NEEDS:   Not appropriate for education at this time  Skin:  Skin Assessment: Skin Integrity Issues: Skin Integrity Issues:: Other (Comment) Other: MASD: skin folds, perineal area  Last BM:  9/24  Height:   Ht Readings from Last 1 Encounters:  01/15/20 5\' 4"  (1.626 m)    Weight:   Wt Readings from Last 1 Encounters:  02/07/20 106.7 kg    Ideal Body Weight:   (IBW 54.5 kg; ajdusted BW:78 kg)  BMI:  Body mass index is 40.38 kg/m.  Estimated Nutritional Needs:   Kcal:  1900-2250  kcals  Protein:  110-125 g  Fluid:  >/= 2 L  02/09/20, MS, RD, LDN Inpatient Clinical Dietitian Contact information available via Amion

## 2020-02-07 NOTE — Progress Notes (Signed)
PT Cancellation Note  Patient Details Name: Julie Mcguire MRN: [id number] DOB: [birth year]   Cancelled Treatment:     PT deferred.  Pt now on 45L HFNC and non-rebreather and per RN continues total assist and unable to follow cues for participation with PT.  Will follow but consider dc from PT if condition not improving.   Nicole Defino 02/07/2020, 8:51 AM

## 2020-02-08 ENCOUNTER — Inpatient Hospital Stay (HOSPITAL_COMMUNITY): Payer: BC Managed Care – PPO

## 2020-02-08 DIAGNOSIS — Z515 Encounter for palliative care: Secondary | ICD-10-CM

## 2020-02-08 LAB — GLUCOSE, CAPILLARY
Glucose-Capillary: 118 mg/dL — ABNORMAL HIGH (ref 70–99)
Glucose-Capillary: 159 mg/dL — ABNORMAL HIGH (ref 70–99)

## 2020-02-08 LAB — CBC WITH DIFFERENTIAL/PLATELET
Abs Immature Granulocytes: 0.7 10*3/uL — ABNORMAL HIGH (ref 0.00–0.07)
Basophils Absolute: 0 10*3/uL (ref 0.0–0.1)
Basophils Relative: 0 %
Eosinophils Absolute: 0.1 10*3/uL (ref 0.0–0.5)
Eosinophils Relative: 1 %
HCT: 24.1 % — ABNORMAL LOW (ref 36.0–46.0)
Hemoglobin: 7.4 g/dL — ABNORMAL LOW (ref 12.0–15.0)
Immature Granulocytes: 5 %
Lymphocytes Relative: 16 %
Lymphs Abs: 2.1 10*3/uL (ref 0.7–4.0)
MCH: 29.8 pg (ref 26.0–34.0)
MCHC: 30.7 g/dL (ref 30.0–36.0)
MCV: 97.2 fL (ref 80.0–100.0)
Monocytes Absolute: 0.6 10*3/uL (ref 0.1–1.0)
Monocytes Relative: 5 %
Neutro Abs: 9.7 10*3/uL — ABNORMAL HIGH (ref 1.7–7.7)
Neutrophils Relative %: 73 %
Platelets: 101 10*3/uL — ABNORMAL LOW (ref 150–400)
RBC: 2.48 MIL/uL — ABNORMAL LOW (ref 3.87–5.11)
RDW: 16.8 % — ABNORMAL HIGH (ref 11.5–15.5)
WBC: 13.3 10*3/uL — ABNORMAL HIGH (ref 4.0–10.5)
nRBC: 1.7 % — ABNORMAL HIGH (ref 0.0–0.2)

## 2020-02-08 LAB — CULTURE, BLOOD (ROUTINE X 2)
Culture: NO GROWTH
Culture: NO GROWTH
Special Requests: ADEQUATE
Special Requests: ADEQUATE

## 2020-02-08 LAB — COMPREHENSIVE METABOLIC PANEL
ALT: 50 U/L — ABNORMAL HIGH (ref 0–44)
AST: 19 U/L (ref 15–41)
Albumin: 2.6 g/dL — ABNORMAL LOW (ref 3.5–5.0)
Alkaline Phosphatase: 57 U/L (ref 38–126)
Anion gap: 11 (ref 5–15)
BUN: 31 mg/dL — ABNORMAL HIGH (ref 8–23)
CO2: 30 mmol/L (ref 22–32)
Calcium: 9.1 mg/dL (ref 8.9–10.3)
Chloride: 106 mmol/L (ref 98–111)
Creatinine, Ser: 1.13 mg/dL — ABNORMAL HIGH (ref 0.44–1.00)
GFR calc Af Amer: >60 mL/min (ref >60)
GFR calc non Af Amer: 52 mL/min — ABNORMAL LOW (ref >60)
Glucose, Bld: 119 mg/dL — ABNORMAL HIGH (ref 70–99)
Potassium: 3.3 mmol/L — ABNORMAL LOW (ref 3.5–5.1)
Sodium: 147 mmol/L — ABNORMAL HIGH (ref 135–145)
Total Bilirubin: 0.4 mg/dL (ref 0.3–1.2)
Total Protein: 5.9 g/dL — ABNORMAL LOW (ref 6.5–8.1)

## 2020-02-08 LAB — D-DIMER, QUANTITATIVE: D-Dimer, Quant: 1.09 ug/mL-FEU — ABNORMAL HIGH (ref 0.00–0.50)

## 2020-02-08 LAB — CBC
HCT: 25.1 % — ABNORMAL LOW (ref 36.0–46.0)
Hemoglobin: 7.9 g/dL — ABNORMAL LOW (ref 12.0–15.0)
MCH: 30.4 pg (ref 26.0–34.0)
MCHC: 31.5 g/dL (ref 30.0–36.0)
MCV: 96.5 fL (ref 80.0–100.0)
Platelets: 113 10*3/uL — ABNORMAL LOW (ref 150–400)
RBC: 2.6 MIL/uL — ABNORMAL LOW (ref 3.87–5.11)
RDW: 17.1 % — ABNORMAL HIGH (ref 11.5–15.5)
WBC: 13 10*3/uL — ABNORMAL HIGH (ref 4.0–10.5)
nRBC: 1.5 % — ABNORMAL HIGH (ref 0.0–0.2)

## 2020-02-08 LAB — FERRITIN: Ferritin: 251 ng/mL (ref 11–307)

## 2020-02-08 LAB — MAGNESIUM: Magnesium: 2 mg/dL (ref 1.7–2.4)

## 2020-02-08 LAB — TYPE AND SCREEN
ABO/RH(D): A POS
Antibody Screen: NEGATIVE

## 2020-02-08 LAB — HEPARIN LEVEL (UNFRACTIONATED): Heparin Unfractionated: 0.45 IU/mL (ref 0.30–0.70)

## 2020-02-08 LAB — PHOSPHORUS: Phosphorus: 4.5 mg/dL (ref 2.5–4.6)

## 2020-02-08 LAB — C-REACTIVE PROTEIN: CRP: 2.8 mg/dL — ABNORMAL HIGH (ref <1.0)

## 2020-02-08 MED ORDER — LORAZEPAM 2 MG/ML IJ SOLN: 1.0000 mg | INTRAMUSCULAR | Status: DC | PRN

## 2020-02-08 MED ORDER — ONDANSETRON HCL 4 MG/2ML IJ SOLN: 4.0000 mg | Freq: Four times a day (QID) | INTRAMUSCULAR | Status: DC | PRN

## 2020-02-08 MED ORDER — ALBUTEROL SULFATE (2.5 MG/3ML) 0.083% IN NEBU: 2.5000 mg | INHALATION_SOLUTION | RESPIRATORY_TRACT | Status: DC | PRN

## 2020-02-08 MED ORDER — SODIUM CHLORIDE 0.9 % IV SOLN
12.5000 mg | Freq: Four times a day (QID) | INTRAVENOUS | Status: DC | PRN
  Filled 2020-02-08: qty 0.5

## 2020-02-08 MED ORDER — INSULIN ASPART 100 UNIT/ML ~~LOC~~ SOLN: 0.0000 [IU] | SUBCUTANEOUS | Status: DC

## 2020-02-08 MED ORDER — ONDANSETRON 4 MG PO TBDP: 4.0000 mg | ORAL_TABLET | Freq: Four times a day (QID) | ORAL | Status: DC | PRN

## 2020-02-08 MED ORDER — POTASSIUM CHLORIDE 10 MEQ/50ML IV SOLN
INTRAVENOUS | Status: AC
  Filled 2020-02-08: qty 50

## 2020-02-08 MED ORDER — BISACODYL 10 MG RE SUPP: 10.0000 mg | Freq: Every day | RECTAL | Status: DC | PRN

## 2020-02-08 MED ORDER — MAGIC MOUTHWASH: 15.0000 mL | Freq: Four times a day (QID) | ORAL | Status: DC | PRN

## 2020-02-08 MED ORDER — METOPROLOL TARTRATE 5 MG/5ML IV SOLN: 5.0000 mg | INTRAVENOUS | Status: DC | PRN

## 2020-02-08 MED ORDER — GLYCOPYRROLATE 0.2 MG/ML IJ SOLN: 0.2000 mg | INTRAMUSCULAR | Status: DC | PRN

## 2020-02-08 MED ORDER — ACETAMINOPHEN 650 MG RE SUPP: 650.0000 mg | Freq: Four times a day (QID) | RECTAL | Status: DC | PRN

## 2020-02-08 MED ORDER — DIPHENHYDRAMINE HCL 50 MG/ML IJ SOLN: 12.5000 mg | INTRAMUSCULAR | Status: DC | PRN

## 2020-02-08 MED ORDER — HALOPERIDOL LACTATE 2 MG/ML PO CONC: 5.0000 mg | ORAL | Status: DC | PRN

## 2020-02-08 MED ORDER — MORPHINE BOLUS VIA INFUSION
4.0000 mg | INTRAVENOUS | Status: DC | PRN
  Administered 2020-02-08 (×2): 4 mg via INTRAVENOUS
  Filled 2020-02-08: qty 4

## 2020-02-08 MED ORDER — TRAVASOL 10 % IV SOLN
INTRAVENOUS | Status: DC
  Filled 2020-02-08: qty 576

## 2020-02-08 MED ORDER — GLYCOPYRROLATE 1 MG PO TABS: 1.0000 mg | ORAL_TABLET | ORAL | Status: DC | PRN

## 2020-02-08 MED ORDER — HALOPERIDOL 1 MG PO TABS: 5.0000 mg | ORAL_TABLET | ORAL | Status: DC | PRN

## 2020-02-08 MED ORDER — LORAZEPAM 2 MG/ML IJ SOLN
2.0000 mg | INTRAMUSCULAR | Status: DC | PRN
  Administered 2020-02-08: 2 mg via INTRAVENOUS

## 2020-02-08 MED ORDER — POTASSIUM CHLORIDE 10 MEQ/50ML IV SOLN
10.0000 meq | INTRAVENOUS | Status: DC
  Administered 2020-02-08 (×5): 10 meq via INTRAVENOUS
  Filled 2020-02-08 (×4): qty 50

## 2020-02-08 MED ORDER — HALOPERIDOL LACTATE 5 MG/ML IJ SOLN: 5.0000 mg | INTRAMUSCULAR | Status: DC | PRN

## 2020-02-08 MED ORDER — MORPHINE 100MG IN NS 100ML (1MG/ML) PREMIX INFUSION
6.0000 mg/h | INTRAVENOUS | Status: DC
  Administered 2020-02-08: 6 mg/h via INTRAVENOUS
  Filled 2020-02-08: qty 100

## 2020-02-08 MED ORDER — ACETAMINOPHEN 325 MG PO TABS: 650.0000 mg | ORAL_TABLET | Freq: Four times a day (QID) | ORAL | Status: DC | PRN

## 2020-02-14 NOTE — Death Summary Note (Signed)
[facility] Death Summary   Patient: Julie Mcguire ZOX:[id number]   PCP: Elias Else, MD DOB: 1956-01-02    Admit Date:  02-06-2020  Date of Death: Date of Death: March 02, 2020  Time of Death: Time of Death: 10-13-04  Length of Stay: 10/31/2022   Hospital Diagnoses:  Principle Cause of death Acute hypoxic respiratory failure due to covid 19 pneumonia  Principal Problem:   Acute respiratory failure with hypoxia (HCC) Active Problems:   Pneumonia due to COVID-19 virus   Essential hypertension   Obesity, Class III, BMI 40-49.9 (morbid obesity) (HCC)   COVID-19   Goals of care, counseling/discussion   Palliative care by specialist   DNR (do not resuscitate) discussion   Secondary bacterial pneumonia   Hypokalemia   Bacterial pneumonia   Transaminitis   Sinus tachycardia   Acute lower UTI   Atrial fibrillation with RVR (HCC)   Thrombocytopenia (HCC)   Elevated BUN   Anemia   Delirium   Hypernatremia   Dysphagia   Comfort measures only status  History of present illness: As per the H and P dictated on admission, "Julie Mcguire is a 64 y.o. female with medical history significant of recent Covid pneumonia, hypertension, moderate obesity who presents to the emergency room today with complaints of worsening shortness of breath.  She was just discharged from here about 4 days ago after  she was managed for Covid pneumonia.  She was discharged on 4 L of oxygen per minute.  Immediately after discharge, she was feeling okay but she again became short of breath, became weak, developed cough and thus presented to the emergency department.  On her last admission, she was treated with steroids, remdesivir and  baricitinib. On EMS arrival, she was hypoxic and was saturating 75% on 6 L of nasal cannula.  Saturation good to 85% with 15 L nonrebreather mask.  Patient was then brought to the emergency department.  As expected, patient has not been vaccinated. Patient seen and examined at the bedside  this afternoon in the emergency department.  After put on nonrebreather, she is feeling better but she was hardly able to speak in full sentences during my evaluation and says she is not feeling good.  She is maintaining her saturation on nonrebreather.  She is coughing and bringing up yellow-greenish sputum. She she also mentioned that her husband has Covid at home but not sick as her.  She says she is feeling very weak, has some stomach upset but denies having any nausea, vomiting, diarrhea, headache or dysuria."  Hospital Course:  Past medical history of hypertension and obesity presents with worsening shortness of breath and hypoxia after recently being hospitalized and discharged for COVID-19 pneumonia. Hospital course complicated by ongoing encephalopathy, staph epi UTI infection, dysphagia and A. fib with RVR.  8/24-8/27 Admit with COVID 2023-02-06 readmission with worsening shortness of breath 9/14 PCCM consulted for evaluation of encephalopathy and hypoxic respiratory failure 9/15 Precedex started 9/16 HHFNC + NRB 9/19precedex off 9/21 new AF RVR, diltiazem, amiodarone gtt started 9/22 worsening thrombocytopenia, hematology consult.  HIT panel negative Mar 03, 2023 Due to persistent severe hypoxia needing 70 LPM Heated HFNC plus NRB and ongoing encephalopathy along with severe respiratory distress and agitation needing restrains family decided to transition to comfort care. We were Anticipating in hospital death.  The patient was pronounced deceased at 20:06, on 03-02-2020.  1. COVID-19 pneumonia Acute hypoxic respiratory failure, POA Sepsis POA secondary to Covid pneumonia Acute metabolic encephalopathy secondary to staph epi UTI  hospital induced delirium Acute diastolic CHF Treated withbaricitinib and remdesivir. CT angio chest negative for any acute pulmonary embolism. diffuse severe bilateral pulmonary infiltrate. Work-up for secondary infection with blood cultures on 02/02/2020 so far  negative. Urine culture on 02/01/2020 + for Staph epidermidis. Started on IV vancomycin for staph epi on 02/03/2020 for 3 days. Persistently remain on 100% FiO2 70 L of oxygen along with 15 L nonrebreather mask with saturation barely in 88's.  2. Acute metabolic encephalopathy Presentation with delirium. Required Precedex. Off of Precedex but remains agitated requiring restraint. Patient was on Seroquel via NG tube. NG tube was removed and therefore patient was started on IV Ativan. IV Haldol on 02/05/2020 was introduced although per RN patient had paradoxical effect with agitation. EEG negative for any acute seizures. Ammonia, TSH level normal. Mildly low B12 level supplemented with subcutaneous injection. No focal deficit on exam. CT head on 01/27/2020 - for any acute abnormality. Continues to remain confused and agitated.  Gets more agitated when the family is around. Requesting multiple times to 'let it go' and requesting to remove her restraints.  3. A. fib with RVR, new onset Cardiology consulted. Adenosine was given which slowed down the heart rate and atrial flutter was evident. Started on amiodarone drip on 02/04/2020. Along with Cardizem and IV metoprolol. Cardiology recommending therapeutic heparin although in the setting of thrombocytopeniaat risk for bleeding. Was on heparin.  Currently on comfort care.  4. Dysphagia In the setting of acute encephalopathy. Failed bedside swallowing evaluation. Bedside NG tube placement failed. IR placement of NG tube was performed, tube feedings were started although unfortunately NG tube was pulled out by patient. NG tube reinserted on 02/06/2020. x-ray on 02/07/2020 shows NG tube in proximal stomach in the hiatal hernia area but unfortunately it is still not able to be flushed and therefore not ready for use On 01/19/2020 still unable to flush the NG tube. Discussed with interventional radiology as well as fluoroscopy radiology.  They  are unable to provide any assistance with NG tube placement. Discussed with pharmacy about initiation of the TPN. Discussed with family regarding risk and benefits of the TPN as well as tube feeding. Understanding the risk and the benefit family decided transition to complete comfort.  5. Hypernatremia, currently resolved. In the setting of poor p.o. intake. Corrected with free water. Currently on comfort care.  6. Elevated BUN In the setting of steroid use.  7. Steroid-induced leukocytosis  8. Acute thrombocytopenia Macrocytic anemia No active bleeding reported by RN. Platelet count significantly reducing. Citrated platelet unable to be measured due to clumping. RBC morphology shows no evidence of schistocytes. INR normal. Fibrinogen level elevated. Haptoglobin 402. While clumping noted on the specimen collected on 02/05/2020, no clumping has been reported on prior specimens. Platelet at peak 300. Hematology consulted. Appreciate their assistance.  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 HIT panel also negative. Pt was on argatroban and later was transition on heparin. Currently comfort care.  9. Obesity, morbid Body mass index is 40.38 kg/m.  Comfort care.  10.accelerated hypertension. Now comfort care.  11.Goals of care conversation. Patient with severe COVID-19 illness with secondary infections, ongoing encephalopathy with multiorgan failure including A. fib with RVR currently still severely hypoxic requiring nonrebreather as well as 15 g of oxygen via high flow nasal cannula. Husband at bedside. Discussed in detail on 02/06/2020 regarding patient's current progress for last 22 days. Also discussed in  detail regardingpoorfuture prognosis should the patient has a cardiac arrest and effect on quality of life. On 02/07/2020 family visited the patient.  After understanding the risk and the  benefit of various procedures and intervention as well as future prognosis with current limitation of multiorgan failure family decided transition to DNR/DNI. On 01/19/2020.  Patient continues to have worsening respiratory distress without any improvement in her mentation and improvement in her nutrition.  Family decided transition to comfort care. Patient was started on morphine infusion given her severe respiratory distress. Amiodarone and heparin were discontinued. Other as needed orders per palliative care protocol were ordered as well.  Procedures and Results:  EEG  Echocardiogram   IR guided NG tube insertion x2  Consultations:  PCCM   Palliative care   Cardiology   Oncology   The results of significant diagnostics from this hospitalization (including imaging, microbiology, ancillary and laboratory) are listed below for reference.    Significant Diagnostic Studies: DG Abd 1 View  Result Date: 02/04/2020 CLINICAL DATA:  Check gastric catheter placement EXAM: ABDOMEN - 1 VIEW COMPARISON:  01/31/2020 FINDINGS: Previously seen weighted feeding catheter has been withdrawn and now lies within the distal esophagus. This should be advanced several cm. IMPRESSION: Weighted feeding catheter within the distal esophagus. Electronically Signed   By: Alcide Clever M.D.   On: 02/04/2020 02:24   DG Abd 1 View  Result Date: 01/31/2020 CLINICAL DATA:  Evaluate OG tube placement EXAM: ABDOMEN - 1 VIEW COMPARISON:  January 30, 2020 FINDINGS: The feeding tube is looped back on itself in the upper abdomen at midline, likely in the proximal stomach. IMPRESSION: The feeding tube is looped back on itself in the upper abdomen at midline, likely in the proximal stomach. Electronically Signed   By: Gerome Sam III M.D   On: 01/31/2020 15:17   DG Abd 1 View  Result Date: 01/30/2020 CLINICAL DATA:  Feeding tube placement EXAM: ABDOMEN - 1 VIEW COMPARISON:  01/29/2020 FINDINGS: Feeding tube is seen  in the upper neck in the region of the oropharynx. Extensive bilateral airspace opacities throughout the lungs. Nonobstructive bowel gas pattern. IMPRESSION: Feeding tube seen in the oropharyngeal region. Extensive bilateral airspace disease, stable. Electronically Signed   By: Charlett Nose M.D.   On: 01/30/2020 12:41   DG Abd 1 View  Result Date: 01/29/2020 CLINICAL DATA:  Check feeding catheter placement EXAM: ABDOMEN - 1 VIEW COMPARISON:  CT from 01/16/2020 FINDINGS: Feeding catheter is noted coiled over the lower left chest lying within the gastric fundus which is within a hiatal hernia. IMPRESSION: Feeding catheter coiled within the fundus of the stomach in a hiatal hernia. Electronically Signed   By: Alcide Clever M.D.   On: 01/29/2020 13:45   CT HEAD WO CONTRAST  Result Date: 01/27/2020 CLINICAL DATA:  Mental status change EXAM: CT HEAD WITHOUT CONTRAST TECHNIQUE: Contiguous axial images were obtained from the base of the skull through the vertex without intravenous contrast. COMPARISON:  None. FINDINGS: Brain: No evidence of acute infarction, hemorrhage, extra-axial collection, ventriculomegaly, or mass effect. Generalized cerebral atrophy. Periventricular white matter low attenuation likely secondary to microangiopathy. Vascular: No significant cerebrovascular atherosclerosis. No hyperdense vessel. Skull: Negative for fracture or focal lesion. Sinuses/Orbits: Visualized portions of the orbits are unremarkable. Visualized portions of the paranasal sinuses are unremarkable. Bilateral mastoid effusions. Other: None. IMPRESSION: 1. No acute intracranial pathology. 2. Chronic microvascular disease and cerebral atrophy. 3. Bilateral mastoid effusions. Electronically Signed   By: Elige Ko  On: 01/27/2020 14:07   CT ANGIO CHEST PE W OR WO CONTRAST  Result Date: 01/16/2020 CLINICAL DATA:  Pulmonary embolus suspected, positive D-dimer inpatient with recent COVID pneumonia, hypertension, moderate  obesity presenting to the ER with worsening shortness of breath. EXAM: CT ANGIOGRAPHY CHEST WITH CONTRAST TECHNIQUE: Multidetector CT imaging of the chest was performed using the standard protocol during bolus administration of intravenous contrast. Multiplanar CT image reconstructions and MIPs were obtained to evaluate the vascular anatomy. CONTRAST:  OMNIPAQUE IOHEXOL 350 MG/ML SOLN COMPARISON:  CT angio chest 05/01/2004. FINDINGS: Cardiovascular: Satisfactory opacification of the pulmonary arteries to the segmental level. No evidence of pulmonary embolism. Normal heart size. No significant pericardial effusion. The thoracic aorta is normal in caliber. Mild atherosclerotic plaque. Mediastinum/Nodes: Moderate volume hiatal hernia. There is a right 1.4 cm precarinal lymph node is likely reactive in etiology open (4:39). No enlarged hilar, or axillary lymph nodes. Thyroid gland, trachea, and esophagus demonstrate no significant findings. Lungs/Pleura: Extensive diffuse peripheral and peribronchovascular ground-glass and consolidative opacities that are more prominent in bilateral upper lobes. No pulmonary nodule or mass in the expanded lungs. No pleural effusion. No pneumothorax. Upper Abdomen: No acute abnormality.  Status post cholecystectomy. Musculoskeletal: At least moderate multilevel degenerative changes of the spine. Review of the MIP images confirms the above findings. IMPRESSION: No pulmonary embolus to the segmental level. Findings consistent with known COVID 19 infection with superimposed pulmonary edema. Associated, likely reactive, 1.4 cm right precarinal lymph node. Aortic Atherosclerosis (ICD10-I70.0). Electronically Signed   By: Tish Frederickson M.D.   On: 01/16/2020 14:13   DG CHEST PORT 1 VIEW  Result Date: 02/04/2020 CLINICAL DATA:  Shortness of breath.  History of COVID pneumonia. EXAM: PORTABLE CHEST 1 VIEW COMPARISON:  01/31/2020. FINDINGS: Interim removal of feeding tube. Heart size  stable. Diffuse severe bilateral pulmonary infiltrates are again noted without interim change. No pleural effusion or pneumothorax. IMPRESSION: 1.  Interim removal of feeding tube. 2. Diffuse severe bilateral pulmonary infiltrates are again noted without interim change. Electronically Signed   By: Maisie Fus  Register   On: 02/04/2020 09:55   DG CHEST PORT 1 VIEW  Result Date: 01/31/2020 CLINICAL DATA:  Shortness of breath.  COVID-19. EXAM: PORTABLE CHEST 1 VIEW COMPARISON:  January 29, 2020. FINDINGS: Stable cardiomediastinal silhouette. Feeding tube is seen entering stomach. No pneumothorax or pleural effusion is noted. Stable bilateral patchy airspace opacities are noted consistent with multifocal pneumonia. Bony thorax is unremarkable. IMPRESSION: Stable bilateral multifocal pneumonia. Electronically Signed   By: Lupita Raider M.D.   On: 01/31/2020 08:55   DG CHEST PORT 1 VIEW  Result Date: 01/29/2020 CLINICAL DATA:  COVID-19 positivity EXAM: PORTABLE CHEST 1 VIEW COMPARISON:  01/25/2020 FINDINGS: Cardiac shadow is stable. Diffuse bilateral airspace opacities are again identified stable in appearance from the prior exam. No new focal abnormality is noted. No bony abnormality is noted. IMPRESSION: Stable airspace opacities bilaterally consistent with the given clinical history. Electronically Signed   By: Alcide Clever M.D.   On: 01/29/2020 07:22   DG Chest Port 1 View  Result Date: 01/25/2020 CLINICAL DATA:  COVID pneumonia EXAM: PORTABLE CHEST 1 VIEW COMPARISON:  Radiograph 01/21/2020 FINDINGS: Stable cardiac silhouette. Patchy bilateral airspace disease is slightly improved compared to prior. No pneumothorax. No pleural fluid. IMPRESSION: Slight improvement in patchy bilateral airspace disease. Electronically Signed   By: Genevive Bi M.D.   On: 01/25/2020 06:59   DG CHEST PORT 1 VIEW  Result Date: 01/21/2020 CLINICAL DATA:  Shortness of breath.  COVID. EXAM: PORTABLE CHEST 1 VIEW COMPARISON:   01/20/2020. FINDINGS: Low lung volumes. Diffuse progressive dense bilateral pulmonary infiltrates. Heart size stable. Left pleural effusion cannot be excluded. No pneumothorax. Degenerative changes scoliosis thoracic spine. IMPRESSION: Low lung volumes. Diffuse progressive dense bilateral pulmonary infiltrates. Left pleural effusion cannot be excluded. Electronically Signed   By: Maisie Fus  Register   On: 01/21/2020 05:36   DG CHEST PORT 1 VIEW  Result Date: 01/20/2020 CLINICAL DATA:  Dyspnea EXAM: PORTABLE CHEST 1 VIEW COMPARISON:  01/29/20 FINDINGS: Shallow inspiration. Bilateral airspace infiltration with increasing consolidation and air bronchograms on the left since previous study. No pleural effusions. No pneumothorax. Heart size is obscured. IMPRESSION: Bilateral airspace infiltration with increasing consolidation and air bronchograms on the left. Electronically Signed   By: Burman Nieves M.D.   On: 01/20/2020 05:00   DG Chest Port 1 View  Result Date: 01-29-2020 CLINICAL DATA:  COVID positive with shortness of breath. EXAM: PORTABLE CHEST 1 VIEW COMPARISON:  January 07, 2020 FINDINGS: Moderate to marked severity bilateral patchy multifocal infiltrates are seen. The heart size and mediastinal contours are within normal limits. Degenerative changes seen throughout the thoracic spine. IMPRESSION: Moderate to marked severity bilateral patchy multifocal infiltrates. Electronically Signed   By: Aram Candela M.D.   On: January 29, 2020 16:59   DG Abd Portable 1V  Result Date: 02/11/2020 CLINICAL DATA:  Feeding tube placement EXAM: PORTABLE ABDOMEN - 1 VIEW COMPARISON:  Yesterday FINDINGS: Weighted feeding tube which is looped in the lower esophagus. Mediastinal soft tissue density at this level is from a hiatal hernia, which was also seen on a 2005 abdominal CT. This finding is known to the clinical team per progress note yesterday. IMPRESSION: The feeding tube is looped at the lower esophagus and a  small hiatal hernia. The tube is partially kinked and could affect function. Positioning is unchanged from yesterday. Electronically Signed   By: Marnee Spring M.D.   On: 01/29/2020 08:49   DG Abd Portable 1V  Result Date: 02/07/2020 CLINICAL DATA:  Evaluate NG tube.  Hiatal hernia. EXAM: PORTABLE ABDOMEN - 1 VIEW COMPARISON:  X-ray 02/04/2020.  CT 01/16/2020 FINDINGS: Feeding tube is looped on itself within the lower thorax, just to the left of midline, likely within the proximal stomach in patient's known hiatal hernia. Nonobstructive bowel gas pattern. IMPRESSION: Feeding tube looped on itself likely within the proximal stomach in patient's known hiatal hernia. Electronically Signed   By: Duanne Guess D.O.   On: 02/07/2020 08:18   DG INTRO LONG GI TUBE  Result Date: 02/06/2020 CLINICAL DATA:  Dysphagia.  Food tube placement. EXAM: FEEDING TUBE PLACEMENT UNDER FLUOROSCOPIC GUIDANCE FLUOROSCOPY TIME:  Fluoroscopy Time:  2 minutes 0 seconds Radiation Exposure Index (if provided by the fluoroscopic device): 32.7 mGy Number of Acquired Spot Images: 0 COMPARISON:  01/30/2020 FINDINGS: With the patient in supine position on the fluoroscopy table, a Dobbhoff feeding tube was inserted into the right naris, and was subsequently passed into the esophagus. The feeding tube was advanced under fluoroscopic guidance, and was coiled within a hiatal hernia, as happened during previous feeding tube placement . The feeding tube could not be advanced into the distal stomach or duodenum despite multiple attempts. The patient tolerated the procedure well, and was subsequently returned to the ICU. IMPRESSION: Placement of Dobbhoff feeding tube under fluoroscopic guidance. The feeding tube was coiled within a hiatal hernia as done previously, but could not be advanced into the distal stomach or  duodenum. Electronically Signed   By: Danae Orleans M.D.   On: 02/06/2020 15:51   DG INTRO LONG GI TUBE  Result Date:  01/30/2020 CLINICAL DATA:  Feeding tube placement EXAM: FL FEEDING TUBE PLACEMENT FLUOROSCOPY TIME:  Fluoroscopy Time:  8 minutes 12 seconds Radiation Exposure Index (if provided by the fluoroscopic device): Number of Acquired Spot Images: 0 COMPARISON:  None. FINDINGS: Fluoroscopic guidance was utilized to advance the feeding tube from the oropharynx into the esophagus. Feeding tube coiled in the hiatal hernia. Multiple attempts were made to pass the feeding tube through the hiatal hernia unsuccessfully. This included multiple different guiding wires. Finally, after numerous unsuccessful attempts, the feeding tube was looped in the hiatal hernia and secured to the nose. IMPRESSION: Unsuccessful attempts at advancing the feeding tube through the hiatal hernia despite multiple attempts with different guiding wires. Electronically Signed   By: Charlett Nose M.D.   On: 01/30/2020 16:57   EEG adult  Result Date: 02/05/2020 Charlsie Quest, MD     02/05/2020  2:41 PM Patient Name: LURAE HORNBROOK MRN: [id number] Epilepsy Attending: Charlsie Quest Referring Physician/Provider: Dr Lynden Oxford Date: 02/05/2020 Duration: 25.14 mins Patient history: 63yo F with ams. EEG to evaluate for seizure. Level of alertness: Awake AEDs during EEG study: None Technical aspects: This EEG study was done with scalp electrodes positioned according to the 10-20 International system of electrode placement. Electrical activity was acquired at a sampling rate of 500Hz  and reviewed with a high frequency filter of 70Hz  and a low frequency filter of 1Hz . EEG data were recorded continuously and digitally stored. Description: No posterior dominant rhythm was seen. EEG showed continuous generalized 3 to 6 Hz theta-delta slowing. Hyperventilation and photic stimulation were not performed.   ABNORMALITY -Continuous slow, generalized IMPRESSION: This study is suggestive of moderate diffuse encephalopathy, nonspecific etiology. No seizures or  epileptiform discharges were seen throughout the recording.   ECHOCARDIOGRAM COMPLETE  Result Date: 02/05/2020    ECHOCARDIOGRAM REPORT   Patient Name:   PETRICE BEEDY [facility] Date of Exam: 02/05/2020 Medical Rec #:  Etta Grandchild       Height:       64.0 in Accession #:    [facility]      Weight:       237.0 lb Date of Birth:  [birth year]      BSA:          2.103 m Patient Age:    63 years        BP:           133/91 mmHg Patient Gender: F               HR:           157 bpm. Exam Location:  Inpatient Procedure: 2D Echo, Cardiac Doppler and Color Doppler                                MODIFIED REPORT:   This report was modified by [id number] MD on 02/05/2020 due to Change                                     TAPSE.  Indications:     Atrial Fibrillation 427.31 / I48.91  History:         Patient has no prior history  of Echocardiogram examinations.                  Risk Factors:Hypertension and Former Smoker.  Sonographer:     Renella Cunas RDCS Referring Phys:  [id number] [facility] M Shaft Corigliano Diagnosing Phys: Laurance Flatten MD IMPRESSIONS  1. Left ventricular ejection fraction, by estimation, is 60 to 65%. The left ventricle has normal function. The left ventricle has no regional wall motion abnormalities. Indeterminate diastolic filling due to E-A fusion.  2. Right ventricular systolic function is normal. The right ventricular size is normal.  3. The mitral valve is normal in structure. Trivial mitral valve regurgitation. No evidence of mitral stenosis.  4. The aortic valve is tricuspid. There is mild thickening of the aortic valve. Aortic valve regurgitation is not visualized. Mild aortic valve sclerosis is present, with no evidence of aortic valve stenosis.  5. The inferior vena cava is normal in size with greater than 50% respiratory variability, suggesting right atrial pressure of 3 mmHg. FINDINGS  Left Ventricle: Left ventricular ejection fraction, by estimation, is 60 to 65%. The left ventricle has  normal function. The left ventricle has no regional wall motion abnormalities. The left ventricular internal cavity size was normal in size. There is  no left ventricular hypertrophy. Indeterminate diastolic filling due to E-A fusion. Right Ventricle: The right ventricular size is normal. No increase in right ventricular wall thickness. Right ventricular systolic function is normal. Left Atrium: Left atrial size was normal in size. Right Atrium: Right atrial size was normal in size. Pericardium: There is no evidence of pericardial effusion. Mitral Valve: The mitral valve is normal in structure. There is mild thickening of the mitral valve leaflet(s). Trivial mitral valve regurgitation. No evidence of mitral valve stenosis. Tricuspid Valve: Peak PASP +RAP. The tricuspid valve is normal in structure. Tricuspid valve regurgitation is mild. Aortic Valve: The aortic valve is tricuspid. There is mild thickening of the aortic valve. There is mild aortic valve annular calcification. Aortic valve regurgitation is not visualized. Mild aortic valve sclerosis is present, with no evidence of aortic valve stenosis. Pulmonic Valve: The pulmonic valve was normal in structure. Pulmonic valve regurgitation is not visualized. Aorta: The aortic root is normal in size and structure. Venous: The inferior vena cava is normal in size with greater than 50% respiratory variability, suggesting right atrial pressure of 3 mmHg. IAS/Shunts: The atrial septum is grossly normal.  LEFT VENTRICLE PLAX 2D LVIDd:         3.65 cm LVIDs:         2.10 cm LV PW:         0.90 cm LV IVS:        0.90 cm LVOT diam:     2.00 cm LV SV:         45 LV SV Index:   21 LVOT Area:     3.14 cm  LEFT ATRIUM             Index       RIGHT ATRIUM           Index LA Vol (A2C):   44.3 ml 21.07 ml/m RA Area:     11.90 cm LA Vol (A4C):   31.1 ml 14.79 ml/m RA Volume:   27.90 ml  13.27 ml/m LA Biplane Vol: 37.8 ml 17.98 ml/m  AORTIC VALVE LVOT Vmax:   112.00 cm/s  LVOT Vmean:  79.200 cm/s LVOT VTI:    0.142 m  AORTA Ao Root diam: 2.90  cm  SHUNTS Systemic VTI:  0.14 m Systemic Diam: 2.00 cm Laurance Flatten MD Electronically signed by Laurance Flatten MD Signature Date/Time: 02/05/2020/12:02:16 PM    Final (Updated)    VAS [place] LOWER EXTREMITY VENOUS (DVT)  Result Date: 01/16/2020  Lower Venous DVTStudy Indications: Elevated ddimer, covid +.  Performing Technologist: Blanch Media RVS  Examination Guidelines: A complete evaluation includes B-mode imaging, spectral Doppler, color Doppler, and power Doppler as needed of all accessible portions of each vessel. Bilateral testing is considered an integral part of a complete examination. Limited examinations for reoccurring indications may be performed as noted. The reflux portion of the exam is performed with the patient in reverse Trendelenburg.  +---------+---------------+---------+-----------+----------+--------------+ RIGHT    CompressibilityPhasicitySpontaneityPropertiesThrombus Aging +---------+---------------+---------+-----------+----------+--------------+ CFV      Full           Yes      Yes                                 +---------+---------------+---------+-----------+----------+--------------+ SFJ      Full                                                        +---------+---------------+---------+-----------+----------+--------------+ FV Prox  Full                                                        +---------+---------------+---------+-----------+----------+--------------+ FV Mid   Full                                                        +---------+---------------+---------+-----------+----------+--------------+ FV DistalFull                                                        +---------+---------------+---------+-----------+----------+--------------+ PFV      Full                                                         +---------+---------------+---------+-----------+----------+--------------+ POP      Full           Yes      Yes                                 +---------+---------------+---------+-----------+----------+--------------+ PTV      Full                                                        +---------+---------------+---------+-----------+----------+--------------+  PERO     Full                                                        +---------+---------------+---------+-----------+----------+--------------+   +---------+---------------+---------+-----------+----------+--------------+ LEFT     CompressibilityPhasicitySpontaneityPropertiesThrombus Aging +---------+---------------+---------+-----------+----------+--------------+ CFV      Full           Yes      Yes                                 +---------+---------------+---------+-----------+----------+--------------+ SFJ      Full                                                        +---------+---------------+---------+-----------+----------+--------------+ FV Prox  Full                                                        +---------+---------------+---------+-----------+----------+--------------+ FV Mid                  Yes      Yes                                 +---------+---------------+---------+-----------+----------+--------------+ FV Distal               Yes      Yes                                 +---------+---------------+---------+-----------+----------+--------------+ PFV      Full                                                        +---------+---------------+---------+-----------+----------+--------------+ POP      Full           Yes      Yes                                 +---------+---------------+---------+-----------+----------+--------------+ PTV      Full                                                         +---------+---------------+---------+-----------+----------+--------------+ PERO     Full                                                        +---------+---------------+---------+-----------+----------+--------------+  Summary: BILATERAL: - No evidence of deep vein thrombosis seen in the lower extremities, bilaterally. - No evidence of superficial venous thrombosis in the lower extremities, bilaterally. -   *See table(s) above for measurements and observations. Electronically signed by Lemar LivingsBrandon Cain MD on 01/16/2020 at 4:49:58 PM.    Final    US EKG SITE RITE  Result Date: 02/07/2020 If Site Rite image not attached, placement could not be confirmed due to current cardiac rhythm.  US EKG SITE RITE  Result Date: 02/07/2020 If Site Rite image not attached, placement could not be confirmed due to current cardiac rhythm.   Microbiology: Recent Results (from the past 240 hour(s))  Culture, Urine     Status: Abnormal   Collection Time: 02/01/20  4:14 PM   Specimen: Urine, Random  Result Value Ref Range Status   Specimen Description   Final    URINE, RANDOM Performed at [facility], [address] [place], [place] [address]    Special Requests   Final    NONE Performed at [facility], [address] [place], [place] [address]    Culture >=100,000 COLONIES/mL STAPHYLOCOCCUS EPIDERMIDIS (A)  Final   Report Status 02/04/2020 FINAL  Final   Organism ID, Bacteria STAPHYLOCOCCUS EPIDERMIDIS (A)  Final      Susceptibility   Staphylococcus epidermidis - MIC*    CIPROFLOXACIN >=8 RESISTANT Resistant     GENTAMICIN <=0.5 SENSITIVE Sensitive     NITROFURANTOIN <=16 SENSITIVE Sensitive     OXACILLIN >=4 RESISTANT Resistant     TETRACYCLINE 2 SENSITIVE Sensitive     VANCOMYCIN <=0.5 SENSITIVE Sensitive     TRIMETH/SULFA 80 RESISTANT Resistant     CLINDAMYCIN >=8 RESISTANT Resistant     RIFAMPIN <=0.5 SENSITIVE Sensitive     Inducible Clindamycin  NEGATIVE Sensitive     * >=100,000 COLONIES/mL STAPHYLOCOCCUS EPIDERMIDIS  Culture, blood (routine x 2)     Status: None   Collection Time: 02/02/20 11:51 AM   Specimen: BLOOD  Result Value Ref Range Status   Specimen Description   Final    BLOOD LEFT ANTECUBITAL Performed at [facility], [address] [place], [place] [address]    Special Requests   Final    BOTTLES DRAWN AEROBIC AND ANAEROBIC Blood Culture adequate volume Performed at [facility], [address] [place], [place] [address]    Culture   Final    NO GROWTH 6 DAYS Performed at [facility], [address] [place], [place] [address]    Report Status 02/07/2020 FINAL  Final  Culture, blood (routine x 2)     Status: None   Collection Time: 02/02/20 11:51 AM   Specimen: BLOOD  Result Value Ref Range Status   Specimen Description   Final    BLOOD BLOOD RIGHT HAND Performed at [facility], [address] [place], [place] [address]    Special Requests   Final    BOTTLES DRAWN AEROBIC AND ANAEROBIC Blood Culture adequate volume Performed at [facility], [address] [place], [place] [address]    Culture   Final    NO GROWTH 6 DAYS Performed at [facility], [address] [place], [place] [address]    Report Status 02/07/2020 FINAL  Final     Labs: CBC: Recent Labs  Lab 02/04/20 0232 02/04/20 0232 02/05/20 0241 02/06/20 0022 02/07/20 0516 02/01/2020 0559 02/07/2020 1147  WBC 23.4*   < >  15.2* 15.5* 14.9* 13.3* 13.0*  NEUTROABS 19.7*  --  12.8* 13.1* 13.1* 9.7*  --   HGB 9.2*   < > 8.0* 8.1* 8.1* 7.4* 7.9*  HCT 29.5*   < > 26.4* 25.8* 25.7* 24.1* 25.1*  MCV 99.0   < > 100.8* 98.1 97.7 97.2 96.5  PLT 120*   < > 96* 71* 82* 101* 113*   < > = values in this interval not displayed.   Basic Metabolic Panel: Recent Labs  Lab 02/04/20 0232 02/05/20 0241 02/06/20 0022 02/07/20 0516 February 20, 2020 0559  NA 146* 144  146* 147* 147*  K 4.1 3.5 3.5 3.2* 3.3*  CL 108 110 110 111 106  CO2 GLUCOSE 173* 116* 103* 112* 119*  BUN 57* 52* 36* 29* 31*  CREATININE 1.05* 1.03* 0.97 0.93 1.13*  CALCIUM 9.3 8.5* 8.7* 8.8* 9.1  MG 2.1 2.1 2.1 2.2 2.0  PHOS 3.9 4.0 3.3 2.7 4.5   Liver Function Tests: Recent Labs  Lab 02/04/20 0232 02/05/20 0241 02/06/20 0022 02/07/20 0516 02/20/20 0559  AST 32 37 36 28 19  ALT 60* 66* 68* 60* 50*  ALKPHOS 67 58 60 59 57  BILITOT 0.7 0.5 0.6 0.6 0.4  PROT 5.8* 5.3* 5.5* 5.6* 5.9*  ALBUMIN 2.8* 2.5* 2.5* 2.5* 2.6*   Cardiac Enzymes: No results for input(s): CKTOTAL, CKMB, CKMBINDEX, TROPONINI in the last 168 hours.  Time spent: 35 minutes  Signed:  Lynden Oxford  [facility]  2020/02/20

## 2020-02-14 NOTE — Progress Notes (Signed)
PHARMACY - TOTAL PARENTERAL NUTRITION CONSULT NOTE   Indication: prolonged inability to tolerate enteral nutrition w/ failed attempts at TF and now kinked tube  Patient Measurements: Height: 5\' 4"  (162.6 cm) Weight: 106.7 kg (235 lb 3.7 oz) IBW/kg (Calculated) : 54.7 TPN AdjBW (KG): 70.8 Body mass index is 40.38 kg/m. Usual Weight: 262 lbs  Assessment: 64 y/o F with extremely prolonged course from COVID PNA including MRSE UTI, AFIB w/ RVR, need for enteral nutrition, and encephalopathy. Patient has previously failed attempts at TF and now has a kinked NGT that is prohibiting further attempts at enteral nutrition.   Glucose / Insulin: Serum glucose OK, hemoglobin A1C 6.4 Electrolytes: Na 147, K 3.3, cCa: 10.2, phos and Mg wnl Renal: SCr 1.13 slightly bumped LFTs / TGs:  Prealbumin / albumin:  Intake / Output; MIVF: -298 ml net, UOP 1325 mL GI Imaging: 9/25 KUB: feeding tube in position but kinked Surgeries / Procedures:   Central access: 9/24 TPN start date: 9/25  Nutritional Goals (per RD recommendation on 9/24): kCal: 1900-2250, Protein: 110-125, Fluid: >/= 2L Goal TPN rate is 80 mL/hr (provides 115 g of protein and 1920 kcals per day)  Current Nutrition:  none  Plan:  Start TPN at 40 mL/hr at 1800 Will proceed with standard electrolytes paying close attention to Na and phos in AM, K and Mg afib goals Electrolytes in TPN: 67mEq/L of Na, 28mEq/L of K, 72mEq/L of Ca, 4mEq/L of Mg, and 41mmol/L of Phos. Cl:Ac ratio 1:1 Add standard MVI and trace elements to TPN Initiate Sensitive q4h SSI and adjust as needed  No MIVF at present Monitor TPN labs on Mon/Thurs Baseline labs in AM  [place], [employer] D 2020-03-06,11:29 AM

## 2020-02-14 NOTE — Progress Notes (Signed)
This nurse wasted 50 mL of Morphine gtts. Wasted verified by George Ina, RN

## 2020-02-14 NOTE — Progress Notes (Signed)
[facility] Progress Note  Patient: Julie Mcguire    ZOX:[id number]  DOA: 02/02/2020     Date of Service: the patient was seen and examined on 01/26/2020  Brief hospital course: Past medical history of hypertension and obesity presents with worsening shortness of breath and hypoxia after recently being hospitalized and discharged for COVID-19 pneumonia.  Hospital course complicated by ongoing encephalopathy, staph epi UTI infection, dysphagia and A. fib with RVR.  8/24-8/27 Admit with COVID 8/31 readmission with worsening shortness of breath 9/14 PCCM consulted for evaluation of encephalopathy and hypoxic respiratory failure 9/15 Precedex started 9/16 HHFNC + NRB 9/19 precedex off 9/21 new AF RVR, diltiazem, amiodarone gtt started 9/22 worsening thrombocytopenia, hematology consult.  HIT panel negative 9/25 comfort care decided by family. Currently plan is continue comfort care.  Anticipating in hospital death.  Assessment and Plan: 1.  COVID-19 pneumonia Acute hypoxic respiratory failure, POA Sepsis POA secondary to Covid pneumonia Acute metabolic encephalopathy secondary to staph epi UTI hospital induced delirium Acute diastolic CHF Treated with baricitinib and remdesivir. Continue to remain hypoxic for now. Continue steroids.  CT angio chest negative for any acute pulmonary embolism. Last chest x-ray on 02/04/2020 shows diffuse severe bilateral pulmonary infiltrate. Work-up for secondary infection with blood cultures on 02/02/2020 so far negative. Urine culture on 02/01/2020 + for Staph epidermidis. Low-grade temperature on 02/05/2020. Started on IV vancomycin for staph epi on 02/03/2020 for 3 days. Continues to remain on 100% FiO2 70 L of oxygen along with 15 L nonrebreather mask with saturation barely in 88's.  2.  Acute metabolic encephalopathy Presentation with delirium. Require Precedex. Off of Precedex remains agitated requiring restraint for now. Patient was on  Seroquel via NG tube. NG tube was removed and therefore patient was started on IV Ativan. IV Haldol on 02/05/2020 was introduced although per RN patient had paradoxical effect with agitation. Currently continuing Ativan and Haldol as needed. EEG negative for any acute seizures. Ammonia, TSH level normal.  Mildly low B12 level supplemented with subcutaneous injection.  No focal deficit on exam. CT head on 01/27/2020 - for any acute abnormality. Continues to remain confused and agitated.  Gets more agitated when the family is around.  Requesting multiple times to let it go and requesting to remove her restraints.  3.  A. fib with RVR, new onset Cardiology consulted. Adenosine was given which slowed down the heart rate and atrial flutter was evident. Started on amiodarone drip on 02/04/2020. Along with Cardizem and IV metoprolol. Cardiology recommending therapeutic heparin although in the setting of thrombocytopenia at risk for bleeding. Per cardiology will require 3 weeks of anticoagulation before considering cardioversion. Monitor for now. Was on heparin.  Currently on comfort care. Continue Cardizem.  Discontinue amiodarone.  As needed Lopressor.  4.  Dysphagia In the setting of acute encephalopathy. Anticipating improvement in swallowing ability once her encephalopathy as well as respiratory distress improves. Failed bedside swallowing evaluation. Bedside NG tube placement failed. IR placement of NG tube was performed, tube feedings were started although unfortunately NG tube was pulled out by patient. NG tube reinserted on 02/06/2020. Currently not ready for use unless x-ray abdomen verifies placement as the NG tube is not in place for now. Repeat x-ray on 02/07/2020 shows NG tube in proximal stomach in the hiatal hernia area but unfortunately it is still not able to be flushed and therefore not ready for use On 01/21/2020 still unable to flush the NG tube. Discussed with interventional  radiology as well as  fluoroscopy radiology.  They are unable to provide any assistance with NG tube placement. Discussed with pharmacy about initiation of the TPN. Discussed with family regarding risk and benefits of the TPN as well as tube feeding. Understanding the risk and the benefit family decided transition to complete comfort.  5.  Hypernatremia, currently resolved. In the setting of poor p.o. intake. Corrected with free water. Currently on comfort care.  6.  Elevated BUN In the setting of steroid use.  7.  Steroid-induced leukocytosis  8.  Acute thrombocytopenia Macrocytic anemia No active bleeding reported by RN. Platelet count significantly reducing.  Currently 96. Citrated platelet unable to be measured due to clumping. RBC morphology shows no evidence of schistocytes. INR normal. Fibrinogen level elevated. Haptoglobin currently pending. White clumping noted on the specimen collected on 02/05/2020 no clumping has been reported on prior specimens for last 4 days. Platelet at peak 300. Hematology consulted.  Appreciate their assistance.   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 HIT panel also negative. Discontinue argatroban and patient was transition on heparin. Currently comfort care.  9.  Obesity, morbid Body mass index is 40.38 kg/m.  Currently unable to take anything p.o. Remains NPO. Comfort care.  10.  Goals of care conversation. Patient with severe COVID-19 illness with secondary infections, ongoing encephalopathy with multiorgan failure including A. fib with RVR currently still severely hypoxic requiring nonrebreather as well as 15 g of oxygen via high flow nasal cannula. Husband at bedside.  Discussed in detail on 02/06/2020 regarding patient's current progress for last 22 days. Also discussed in detail regarding poor future prognosis should the patient has a cardiac arrest and quality of  life. Husband currently agrees that the quality of life may significantly suffer but currently wants to process the information that he has been presented with.  On 02/07/2020 family visited the patient.  After understanding the risk and the benefit of various procedures and intervention as well as future prognosis with current limitation of multiorgan failure family decided transition to DNR/DNI. On 02/26/2020.  Patient continues to have worsening respiratory distress without any improvement in her mentation and improvement in her nutrition.  Family decided present to comfort care. Patient was started on morphine infusion given her severe respiratory distress. Amiodarone and heparin were discontinued. Other as needed orders per palliative care protocol were ordered as well.   11 accelerated hypertension. Now comfort care.  Diet: N.p.o. for now. NG tube removed.Marland Kitchen  DVT Prophylaxis: Comfort care     Advance goals of care discussion: DNR/DNI.  Comfort care.  Family Communication: family was present at bedside, at the time of interview.   Disposition:  Status is: Inpatient  Remains inpatient appropriate because:Hemodynamically unstable and Altered mental status  Dispo:  Patient From:   Home  Planned Disposition:   Anticipating in hospital death.  Expected discharge date: Not clear   Medically stable for discharge:    No  Subjective: Remains sedated and confused.  No nausea no vomiting but no fever no chills.  Physical Exam:  General: Appear in severe distress, no Rash;  Cardiovascular: S1 and S2 Present, no Murmur, Respiratory: increased respiratory effort, Bilateral Air entry present and bilateral Crackles, no wheezes Abdomen: Bowel Sound present, Soft and difficult to assess  tenderness Extremities: trace Pedal edema Neurology: obtunded and not oriented to time, place, and person affect emotionally labile. no new focal deficit Gait not checked due to patient safety  concerns  Vitals:   02/11/2020 1700 01/21/2020 1740 01/23/2020 1800 01/21/2020 1840  BP:      Pulse: 76 91 99 99  Resp: 11 (!) 22 (!) 21 18  Temp: 97.9 F (36.6 C) 97.7 F (36.5 C) 97.7 F (36.5 C)   TempSrc:      SpO2: 91% (!) 41% (!) 29% (!) 35%  Weight:      Height:        Intake/Output Summary (Last 24 hours) at 01/24/2020 1910 Last data filed at 01/15/2020 1800 Gross per 24 hour  Intake 1368.58 ml  Output 1950 ml  Net -581.42 ml   Filed Weights   02/06/20 0500 02/07/20 0113 01/25/2020 0449  Weight: 107.8 kg 106.7 kg 106.7 kg    Data Reviewed: I have personally reviewed and interpreted daily labs, tele strips, imagings as discussed above. I reviewed all nursing notes, pharmacy notes, vitals, pertinent old records I have discussed plan of care as described above with RN and patient/family.  CBC: Recent Labs  Lab 02/04/20 0232 02/04/20 0232 02/05/20 0241 02/06/20 0022 02/07/20 0516 01/31/2020 0559 02/11/2020 1147  WBC 23.4*   < > 15.2* 15.5* 14.9* 13.3* 13.0*  NEUTROABS 19.7*  --  12.8* 13.1* 13.1* 9.7*  --   HGB 9.2*   < > 8.0* 8.1* 8.1* 7.4* 7.9*  HCT 29.5*   < > 26.4* 25.8* 25.7* 24.1* 25.1*  MCV 99.0   < > 100.8* 98.1 97.7 97.2 96.5  PLT 120*   < > 96* 71* 82* 101* 113*   < > = values in this interval not displayed.   Basic Metabolic Panel: Recent Labs  Lab 02/04/20 0232 02/05/20 0241 02/06/20 0022 02/07/20 0516 01/23/2020 0559  NA 146* 144 146* 147* 147*  K 4.1 3.5 3.5 3.2* 3.3*  CL 108 110 110 111 106  CO2 26 23 26 26 30   GLUCOSE 173* 116* 103* 112* 119*  BUN 57* 52* 36* 29* 31*  CREATININE 1.05* 1.03* 0.97 0.93 1.13*  CALCIUM 9.3 8.5* 8.7* 8.8* 9.1  MG 2.1 2.1 2.1 2.2 2.0  PHOS 3.9 4.0 3.3 2.7 4.5    Studies: DG Abd Portable 1V  Result Date: 02/12/2020 CLINICAL DATA:  Feeding tube placement EXAM: PORTABLE ABDOMEN - 1 VIEW COMPARISON:  Yesterday FINDINGS: Weighted feeding tube which is looped in the lower esophagus. Mediastinal soft tissue density at  this level is from a hiatal hernia, which was also seen on a 2005 abdominal CT. This finding is known to the clinical team per progress note yesterday. IMPRESSION: The feeding tube is looped at the lower esophagus and a small hiatal hernia. The tube is partially kinked and could affect function. Positioning is unchanged from yesterday. Electronically Signed   By: 2006 M.D.   On: 01/29/2020 08:49    Scheduled Meds: . Gerhardt's butt cream   Topical BID  . mouth rinse  15 mL Mouth Rinse BID  . methylPREDNISolone (SOLU-MEDROL) injection  20 mg Intravenous Q24H  . nystatin   Topical BID  . sodium chloride flush  10-40 mL Intracatheter Q12H   Continuous Infusions: . sodium chloride Stopped (02/07/20 0657)  . chlorproMAZINE (THORAZINE) IV    . diltiazem (CARDIZEM) infusion 15 mg/hr (01/15/2020 1800)  . morphine 6 mg/hr (02/02/2020 1800)   PRN Meds: sodium chloride, acetaminophen **OR** acetaminophen, albuterol, bisacodyl, chlorproMAZINE (THORAZINE) IV, diclofenac Sodium, diphenhydrAMINE, glycopyrrolate **OR** glycopyrrolate **OR** glycopyrrolate, lip balm, LORazepam, magic mouthwash, metoprolol tartrate, morphine, ondansetron **OR** ondansetron (ZOFRAN) IV, sodium chloride flush  Time spent: 35 minutes  Author: Lynden OxfordPranav Overton Boggus, MD [facility] 07/12/19 7:10 PM  To reach On-call, see care teams to locate the attending and reach out via [email or web address]. Between 7PM-7AM, please contact night-coverage If you still have difficulty reaching the attending provider, please page the [facility] (Director on Call) for [facility] on amion for assistance.

## 2020-02-14 NOTE — Progress Notes (Signed)
Progress Note  Patient Name: ELLEY HARP Date of Encounter: 02/07/2020  [facility] Cardiologist: Olga Millers, MD   Subjective   Encephalopathic.  Unable to assess.  Inpatient Medications    Scheduled Meds: . chlorhexidine  15 mL Mouth Rinse BID  . Chlorhexidine Gluconate Cloth  6 each Topical Daily  . furosemide  40 mg Intravenous BID  . Gerhardt's butt cream   Topical BID  . mouth rinse  15 mL Mouth Rinse BID  . methylPREDNISolone (SOLU-MEDROL) injection  20 mg Intravenous Q24H  . metoprolol tartrate  7.5 mg Intravenous Q6H  . nystatin   Topical BID  . QUEtiapine  25 mg Per Tube QHS  . sodium chloride flush  10-40 mL Intracatheter Q12H  . sodium chloride flush  3 mL Intravenous Q12H   Continuous Infusions: . sodium chloride Stopped (02/07/20 0657)  . sodium chloride    . amiodarone 30 mg/hr (01/17/2020 0751)  . diltiazem (CARDIZEM) infusion 15 mg/hr (01/20/2020 0600)  . heparin 950 Units/hr (01/22/2020 0600)  . potassium chloride 10 mEq (02/07/2020 0813)   PRN Meds: sodium chloride, sodium chloride, acetaminophen, albuterol, diclofenac Sodium, haloperidol lactate, lip balm, LORazepam, morphine injection, ondansetron (ZOFRAN) IV, sodium chloride flush, sodium chloride flush   Vital Signs    Vitals:   02/11/2020 0600 01/22/2020 0630 01/29/2020 0700 01/24/2020 0806  BP: (!) 107/53 (!) 109/54 (!) 126/49 (!) 143/64  Pulse: 67 66 65 88  Resp: 12 12 15  (!) 24  Temp: (!) 97.2 F (36.2 C) (!) 97.2 F (36.2 C) (!) 97.2 F (36.2 C) (!) 97.3 F (36.3 C)  TempSrc: Bladder Bladder    SpO2: (!) 86% (!) 86% (!) 80% 90%  Weight:      Height:        Intake/Output Summary (Last 24 hours) at 01/18/2020 0843 Last data filed at 01/23/2020 0600 Gross per 24 hour  Intake 1027.29 ml  Output 1325 ml  Net -297.71 ml   Last 3 Weights 01/18/2020 02/07/2020 02/06/2020  Weight (lbs) 235 lb 3.7 oz 235 lb 3.7 oz 237 lb 10.5 oz  Weight (kg) 106.7 kg 106.7 kg 107.8 kg      Telemetry      Sinus rhythm.  No arrhythmias - Personally Reviewed  ECG    n/a - Personally Reviewed  Physical Exam   VS:  BP (!) 140/54   Pulse 71   Temp (!) 97.5 F (36.4 C) (Bladder)   Resp 15   Ht 5\' 4"  (1.626 m)   Wt 106.7 kg   SpO2 (!) 88%   BMI 40.38 kg/m  , BMI Body mass index is 40.38 kg/m. GENERAL:  Ill-appearing.  Encephalopathic and non-responsive HEENT: Pupils equal round and reactive, fundi not visualized, oral mucosa unremarkable NECK:  No jugular venous distention, waveform within normal limits, carotid upstroke brisk and symmetric, no bruits LUNGS:  Clear to auscultation bilaterally on anterior exam HEART:  RRR.  PMI not displaced or sustained,S1 and S2 within normal limits, no S3, no S4, no clicks, no rubs, no murmurs ABD:  Flat, positive bowel sounds normal in frequency in pitch, no bruits, no rebound, no guarding, no midline pulsatile mass, no hepatomegaly, no splenomegaly EXT:  2 plus pulses throughout, no edema, no cyanosis no clubbing SKIN:  No rashes no nodules NEURO:  Unable to assess PSYCH:  Unable to assess    Labs    High Sensitivity Troponin:  No results for input(s): TROPONINIHS in the last 720 hours.  Chemistry Recent Labs  Lab 02/06/20 0022 02/07/20 0516 14-Feb-2020 0559  NA 146* 147* 147*  K 3.5 3.2* 3.3*  CL 110 111 106  CO2 26 26 30   GLUCOSE 103* 112* 119*  BUN 36* 29* 31*  CREATININE 0.97 0.93 1.13*  CALCIUM 8.7* 8.8* 9.1  PROT 5.5* 5.6* 5.9*  ALBUMIN 2.5* 2.5* 2.6*  AST 36 28 19  ALT 68* 60* 50*  ALKPHOS 60 59 57  BILITOT 0.6 0.6 0.4  GFRNONAA >60 >60 52*  GFRAA >60 >60 >60  ANIONGAP 10 10 11      Hematology Recent Labs  Lab 02/06/20 0022 02/06/20 1718 02/07/20 0516 February 14, 2020 0559  WBC 15.5*  --  14.9* 13.3*  RBC 2.63* 2.64* 2.63* 2.48*  HGB 8.1*  --  8.1* 7.4*  HCT 25.8*  --  25.7* 24.1*  MCV 98.1  --  97.7 97.2  MCH 30.8  --  30.8 29.8  MCHC 31.4  --  31.5 30.7  RDW 16.1*  --  16.6* 16.8*  PLT 71*  --  82* 101*     BNPNo results for input(s): BNP, PROBNP in the last 168 hours.   DDimer  Recent Labs  Lab 02/06/20 0022 02/07/20 0516 February 14, 2020 0559  DDIMER 0.88* 1.28* 1.09*     Radiology    DG Abd Portable 1V  Result Date: 02/07/2020 CLINICAL DATA:  Evaluate NG tube.  Hiatal hernia. EXAM: PORTABLE ABDOMEN - 1 VIEW COMPARISON:  X-ray 02/04/2020.  CT 01/16/2020 FINDINGS: Feeding tube is looped on itself within the lower thorax, just to the left of midline, likely within the proximal stomach in patient's known hiatal hernia. Nonobstructive bowel gas pattern. IMPRESSION: Feeding tube looped on itself likely within the proximal stomach in patient's known hiatal hernia. Electronically Signed   By: 02/06/2020 D.O.   On: 02/07/2020 08:18   DG INTRO LONG GI TUBE  Result Date: 02/06/2020 CLINICAL DATA:  Dysphagia.  Food tube placement. EXAM: FEEDING TUBE PLACEMENT UNDER FLUOROSCOPIC GUIDANCE FLUOROSCOPY TIME:  Fluoroscopy Time:  2 minutes 0 seconds Radiation Exposure Index (if provided by the fluoroscopic device): 32.7 mGy Number of Acquired Spot Images: 0 COMPARISON:  01/30/2020 FINDINGS: With the patient in supine position on the fluoroscopy table, a Dobbhoff feeding tube was inserted into the right naris, and was subsequently passed into the esophagus. The feeding tube was advanced under fluoroscopic guidance, and was coiled within a hiatal hernia, as happened during previous feeding tube placement . The feeding tube could not be advanced into the distal stomach or duodenum despite multiple attempts. The patient tolerated the procedure well, and was subsequently returned to the ICU. IMPRESSION: Placement of Dobbhoff feeding tube under fluoroscopic guidance. The feeding tube was coiled within a hiatal hernia as done previously, but could not be advanced into the distal stomach or duodenum. Electronically Signed   By: 02/04/2020 M.D.   On: 02/06/2020 15:51   Danae [place] EKG SITE RITE  Result Date: 02/07/2020 If  Site Rite image not attached, placement could not be confirmed due to current cardiac rhythm.  [place] EKG SITE RITE  Result Date: 02/07/2020 If Site Rite image not attached, placement could not be confirmed due to current cardiac rhythm.   Cardiac Studies   Echo 01/2020: 1. Left ventricular ejection fraction, by estimation, is 60 to 65%. The  left ventricle has normal function. The left ventricle has no regional  wall motion abnormalities. Indeterminate diastolic filling due to E-A  fusion.  2. Right ventricular systolic function  is normal. The right ventricular  size is normal.  3. The mitral valve is normal in structure. Trivial mitral valve  regurgitation. No evidence of mitral stenosis.  4. The aortic valve is tricuspid. There is mild thickening of the aortic  valve. Aortic valve regurgitation is not visualized. Mild aortic valve  sclerosis is present, with no evidence of aortic valve stenosis.  5. The inferior vena cava is normal in size with greater than 50%  respiratory variability, suggesting right atrial pressure of 3 mmHg.   Patient Profile     64 y.o. female hypertension, morbid obesity, and recent COVID-19 pneumonia not being followed by cardiology for atrial fibrillation with rapid ventricular response.  Assessment & Plan    # Paroxysmal atrial fibrillation:  Initially went into atrial fibrillation with RVR.  She has now converted to sinus rhythm.  Continue amiodarone, diltiazem, metoprolol, and heparin.  Patient is encephalopathic.  Will transition to oral as able.  Maintain potassium greater than 4.  # Essential hypertension: Blood pressure somewhat labile but stable.  Continue metoprolol and diltiazem.  # Thrombocytopenia:   Continue to monitor platelets.  Slightly improved today. HIT panel was negative.  # AKI: Renal function worsening slightly.  Maintaining a nearly net even fluid balance.  Continue Lasix at twice daily for now.  May need to reduce to once a  day.       For questions or updates, please contact [facility] Please consult [email or web address] for contact info under        Signed, Chilton Si, MD  01/25/2020, 8:43 AM

## 2020-02-14 NOTE — Progress Notes (Signed)
Chaplain was prepared to visit patient when the family pastor arrived at the same time.  Chaplain had come to be with patient in response to an End of life consult and when the family pastor arrived right at the same time, felt it was better to have their familiar pastor and let them have this time together. Phebe Colla, Chaplain   01/18/2020 1700  Clinical Encounter Type  Visited With Health care provider   Phebe Colla, Chaplain

## 2020-02-14 NOTE — Progress Notes (Signed)
I called medical examiner to notify patient was in soft wrist restraints as of 1300.  She passed at 2006.  After review they declined to follow up with medical examination, due to her comfort care status and imminent passing.

## 2020-02-14 NOTE — Progress Notes (Signed)
ANTICOAGULATION CONSULT NOTE  Pharmacy Consult for Argatroban >> IV heparin Indication: Afib, HIT r/o  Allergies  Allergen Reactions  . Codeine Nausea And Vomiting    Patient Measurements: Height: 5\' 4"  (162.6 cm) Weight: 106.7 kg (235 lb 3.7 oz) IBW/kg (Calculated) : 54.7  Vital Signs: Temp: 97.3 F (36.3 C) (09/25 1000) Temp Source: Bladder (09/25 0900) BP: 118/56 (09/25 1000) Pulse Rate: 69 (09/25 1000)  Labs: Recent Labs    02/06/20 0022 02/06/20 0022 02/06/20 0229 02/06/20 0512 02/06/20 1009 02/06/20 1316 02/07/20 0516 02/07/20 1800 01/16/2020 0559  HGB 8.1*   < >  --   --   --   --  8.1*  --  7.4*  HCT 25.8*  --   --   --   --   --  25.7*  --  24.1*  PLT 71*  --   --   --   --   --  82*  --  101*  APTT 20*  --    < > 37* 43* 25  --   --   --   HEPARINUNFRC  --   --   --   --   --   --  0.31 0.30 0.45  CREATININE 0.97  --   --   --   --   --  0.93  --  1.13*   < > = values in this interval not displayed.    Estimated Creatinine Clearance: 62.3 mL/min (A) (by C-G formula based on SCr of 1.13 mg/dL (H)).   Assessment: 64 y/o F admitted with worsening ShOB after previously being hospitalized for COVID-PNA. Hospital course has been prolonged and complicated by encephalopathy, UTI, dysphagia, and AFIB with RVR. Platelets along with hemoglobin have been progressively declining with PLT decreased significantly in the last several days. Patient seems to be outside of the window for HIT but after discussing with Dr. 44, other causes for severe thrombocytopenia seem less likely. 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 Plan is to proceed to order HIT panel and proceed with argatroban.   4T score= 4  Prior to restart of IV heparin:   HIT antibody negative at 0.094 - appears Onc and [facility] have fairly low suspicion of true HIT given clinical picture  Discussed HIT Ab result with mid-level provider; will transition back to heparin with  conservative dosing given bleeding risk   Today, 01/21/2020   HL is therapeutic at 0.45  Hgb low but stable, recheck ordered for noon  Plt low but appear to be recovering  No bleeding issues per RN    Goal of Therapy:  Anti-Xa level 0.3-0.5 Monitor platelets by anticoagulation protocol: Yes   Plan:   Continue heparin infusion at 950 units/hr, no bolus  Daily CBC and heparin level  Per Cards, once need for further procedures ruled out, patient can transition to Eliquis (patient in encephalopathic at present- NPO with aspiration precautions)   02/10/20, PharmD, BCPS [phone] 02/11/2020 10:22 AM

## 2020-02-14 DEATH — deceased
